# Patient Record
Sex: Male | Born: 1970 | Race: Black or African American | Hispanic: No | Marital: Married | Smoking: Never smoker
Health system: Southern US, Community
[De-identification: ages and names within clinical notes are randomized; demographics above are authoritative.]

## PROBLEM LIST (undated history)

## (undated) DIAGNOSIS — N529 Male erectile dysfunction, unspecified: Secondary | ICD-10-CM

## (undated) DIAGNOSIS — E11319 Type 2 diabetes mellitus with unspecified diabetic retinopathy without macular edema: Secondary | ICD-10-CM

## (undated) DIAGNOSIS — R9431 Abnormal electrocardiogram [ECG] [EKG]: Secondary | ICD-10-CM

## (undated) DIAGNOSIS — E785 Hyperlipidemia, unspecified: Secondary | ICD-10-CM

## (undated) DIAGNOSIS — I63521 Cerebral infarction due to unspecified occlusion or stenosis of right anterior cerebral artery: Secondary | ICD-10-CM

## (undated) DIAGNOSIS — E1169 Type 2 diabetes mellitus with other specified complication: Secondary | ICD-10-CM

## (undated) DIAGNOSIS — I2510 Atherosclerotic heart disease of native coronary artery without angina pectoris: Secondary | ICD-10-CM

## (undated) DIAGNOSIS — H6980 Other specified disorders of Eustachian tube, unspecified ear: Secondary | ICD-10-CM

## (undated) DIAGNOSIS — R339 Retention of urine, unspecified: Secondary | ICD-10-CM

## (undated) DIAGNOSIS — M545 Low back pain, unspecified: Secondary | ICD-10-CM

## (undated) DIAGNOSIS — I10 Essential (primary) hypertension: Secondary | ICD-10-CM

## (undated) DIAGNOSIS — I889 Nonspecific lymphadenitis, unspecified: Secondary | ICD-10-CM

## (undated) DIAGNOSIS — H6990 Unspecified Eustachian tube disorder, unspecified ear: Secondary | ICD-10-CM

## (undated) DIAGNOSIS — E1165 Type 2 diabetes mellitus with hyperglycemia: Secondary | ICD-10-CM

## (undated) DIAGNOSIS — Z8673 Personal history of transient ischemic attack (TIA), and cerebral infarction without residual deficits: Secondary | ICD-10-CM

## (undated) DIAGNOSIS — IMO0002 Reserved for concepts with insufficient information to code with codable children: Secondary | ICD-10-CM

## (undated) HISTORY — DX: Type 2 diabetes mellitus with unspecified diabetic retinopathy without macular edema: E11.319

## (undated) HISTORY — DX: Low back pain, unspecified: M54.50

## (undated) HISTORY — DX: Type 2 diabetes mellitus with unspecified diabetic retinopathy without macular edema: E11.65

## (undated) HISTORY — DX: Atherosclerotic heart disease of native coronary artery without angina pectoris: I25.10

## (undated) HISTORY — DX: Male erectile dysfunction, unspecified: N52.9

## (undated) HISTORY — DX: Essential (primary) hypertension: I10

## (undated) HISTORY — DX: Reserved for concepts with insufficient information to code with codable children: IMO0002

## (undated) HISTORY — DX: Abnormal electrocardiogram (ECG) (EKG): R94.31

## (undated) HISTORY — DX: Nonspecific lymphadenitis, unspecified: I88.9

## (undated) HISTORY — DX: Low back pain: M54.5

## (undated) HISTORY — DX: Hyperlipidemia, unspecified: E78.5

## (undated) HISTORY — DX: Cerebral infarction due to unspecified occlusion or stenosis of right anterior cerebral artery: I63.521

## (undated) HISTORY — DX: Other specified disorders of Eustachian tube, unspecified ear: H69.80

## (undated) HISTORY — DX: Retention of urine, unspecified: R33.9

## (undated) HISTORY — DX: Type 2 diabetes mellitus with other specified complication: E11.69

## (undated) HISTORY — DX: Personal history of transient ischemic attack (TIA), and cerebral infarction without residual deficits: Z86.73

## (undated) HISTORY — DX: Unspecified eustachian tube disorder, unspecified ear: H69.90

---

## 2007-10-09 ENCOUNTER — Ambulatory Visit: Payer: Self-pay | Admitting: Family Medicine

## 2007-10-09 DIAGNOSIS — M25519 Pain in unspecified shoulder: Secondary | ICD-10-CM

## 2007-10-09 DIAGNOSIS — Z860100 Personal history of colon polyps, unspecified: Secondary | ICD-10-CM | POA: Insufficient documentation

## 2007-10-09 DIAGNOSIS — E1151 Type 2 diabetes mellitus with diabetic peripheral angiopathy without gangrene: Secondary | ICD-10-CM

## 2007-10-09 DIAGNOSIS — F528 Other sexual dysfunction not due to a substance or known physiological condition: Secondary | ICD-10-CM

## 2007-11-16 ENCOUNTER — Ambulatory Visit: Payer: Self-pay | Admitting: Family Medicine

## 2007-11-16 LAB — CONVERTED CEMR LAB
Albumin: 3.8 g/dL (ref 3.5–5.2)
BUN: 10 mg/dL (ref 6–23)
Calcium: 9.1 mg/dL (ref 8.4–10.5)
Cholesterol: 224 mg/dL (ref 0–200)
Creatinine,U: 140.6 mg/dL
Eosinophils Absolute: 0.1 10*3/uL (ref 0.0–0.7)
Eosinophils Relative: 1.8 % (ref 0.0–5.0)
GFR calc Af Amer: 107 mL/min
Glucose, Bld: 275 mg/dL — ABNORMAL HIGH (ref 70–99)
HCT: 43.6 % (ref 39.0–52.0)
Hemoglobin: 15.5 g/dL (ref 13.0–17.0)
MCV: 82.3 fL (ref 78.0–100.0)
Microalb Creat Ratio: 167.1 mg/g — ABNORMAL HIGH (ref 0.0–30.0)
Microalb, Ur: 23.5 mg/dL — ABNORMAL HIGH (ref 0.0–1.9)
Monocytes Absolute: 0.3 10*3/uL (ref 0.1–1.0)
Monocytes Relative: 8.3 % (ref 3.0–12.0)
Neutro Abs: 1.5 10*3/uL (ref 1.4–7.7)
RDW: 14.1 % (ref 11.5–14.6)
Sodium: 132 meq/L — ABNORMAL LOW (ref 135–145)
Total CHOL/HDL Ratio: 7.6
Total Protein: 6.1 g/dL (ref 6.0–8.3)

## 2007-11-17 ENCOUNTER — Ambulatory Visit: Payer: Self-pay | Admitting: Family Medicine

## 2007-11-17 DIAGNOSIS — E782 Mixed hyperlipidemia: Secondary | ICD-10-CM | POA: Insufficient documentation

## 2007-11-17 DIAGNOSIS — I10 Essential (primary) hypertension: Secondary | ICD-10-CM

## 2007-12-25 ENCOUNTER — Encounter: Admission: RE | Admit: 2007-12-25 | Discharge: 2007-12-25 | Payer: Self-pay | Admitting: Family Medicine

## 2012-01-17 LAB — TSH: TSH: 3.96 (ref 0.41–5.90)

## 2017-01-16 DIAGNOSIS — R339 Retention of urine, unspecified: Secondary | ICD-10-CM

## 2017-01-16 HISTORY — DX: Retention of urine, unspecified: R33.9

## 2017-01-20 LAB — CBC AND DIFFERENTIAL
HCT: 37 — AB (ref 41–53)
Hemoglobin: 12.8 — AB (ref 13.5–17.5)
Platelets: 283 (ref 150–399)
WBC: 8.2

## 2017-01-21 LAB — CBC AND DIFFERENTIAL
HEMATOCRIT: 35 — AB (ref 41–53)
HEMOGLOBIN: 12.1 — AB (ref 13.5–17.5)
Platelets: 293 (ref 150–399)
WBC: 8.8

## 2017-01-21 LAB — HEMOGLOBIN A1C: Hemoglobin A1C: 11.2

## 2017-01-22 LAB — LIPID PANEL
CHOLESTEROL: 263 — AB (ref 0–200)
HDL: 38 (ref 35–70)
LDL Cholesterol: 165
TRIGLYCERIDES: 298 — AB (ref 40–160)

## 2017-01-22 LAB — POCT INR: INR: 1 (ref 0.9–1.1)

## 2017-01-22 LAB — CBC AND DIFFERENTIAL
HEMATOCRIT: 34 — AB (ref 41–53)
HEMOGLOBIN: 11.6 — AB (ref 13.5–17.5)
Platelets: 298 (ref 150–399)
WBC: 6.5

## 2017-01-22 LAB — PROTIME-INR: Protime: 9.8 — AB (ref 10.0–13.8)

## 2017-01-24 LAB — BASIC METABOLIC PANEL
BUN: 32 — AB (ref 4–21)
Creatinine: 1.8 — AB (ref 0.6–1.3)
Glucose: 254
Potassium: 4.5 (ref 3.4–5.3)
SODIUM: 136 — AB (ref 137–147)

## 2017-01-25 LAB — BASIC METABOLIC PANEL
BUN: 30 — AB (ref 4–21)
CREATININE: 1.6 — AB (ref 0.6–1.3)
GLUCOSE: 185
Potassium: 4.2 (ref 3.4–5.3)
Sodium: 138 (ref 137–147)

## 2017-01-26 LAB — BASIC METABOLIC PANEL
BUN: 35 — AB (ref 4–21)
Creatinine: 1.8 — AB (ref 0.6–1.3)
Glucose: 198
POTASSIUM: 4.1 (ref 3.4–5.3)
SODIUM: 138 (ref 137–147)

## 2017-01-28 ENCOUNTER — Encounter: Payer: Self-pay | Admitting: Adult Health

## 2017-01-28 ENCOUNTER — Encounter: Payer: Self-pay | Admitting: Internal Medicine

## 2017-01-28 ENCOUNTER — Non-Acute Institutional Stay (SKILLED_NURSING_FACILITY): Payer: 59 | Admitting: Adult Health

## 2017-01-28 DIAGNOSIS — R400 Somnolence: Secondary | ICD-10-CM

## 2017-01-28 DIAGNOSIS — E1165 Type 2 diabetes mellitus with hyperglycemia: Secondary | ICD-10-CM | POA: Diagnosis not present

## 2017-01-28 DIAGNOSIS — G8194 Hemiplegia, unspecified affecting left nondominant side: Secondary | ICD-10-CM

## 2017-01-28 DIAGNOSIS — E785 Hyperlipidemia, unspecified: Secondary | ICD-10-CM

## 2017-01-28 DIAGNOSIS — E1122 Type 2 diabetes mellitus with diabetic chronic kidney disease: Secondary | ICD-10-CM | POA: Diagnosis not present

## 2017-01-28 DIAGNOSIS — I10 Essential (primary) hypertension: Secondary | ICD-10-CM | POA: Diagnosis not present

## 2017-01-28 DIAGNOSIS — I2510 Atherosclerotic heart disease of native coronary artery without angina pectoris: Secondary | ICD-10-CM | POA: Diagnosis not present

## 2017-01-28 DIAGNOSIS — I63521 Cerebral infarction due to unspecified occlusion or stenosis of right anterior cerebral artery: Secondary | ICD-10-CM

## 2017-01-28 DIAGNOSIS — F322 Major depressive disorder, single episode, severe without psychotic features: Secondary | ICD-10-CM

## 2017-01-28 NOTE — Progress Notes (Signed)
DATE:  01/28/2017   MRN:  [id number]  BIRTHDAY: [birth year]  Facility:  Nursing Home Location:  [facility] Room Number: 309-A  LEVEL OF CARE:  SNF (31)  Contact Information    Name Relation Home Work Mobile   Zill,Frank Moss  [phone]         Code Status History    This patient does not have a recorded code status. Please follow your organizational policy for patients in this situation.       Chief Complaint  Patient presents with  . Hospitalization Follow-up    Hospital followup    HISTORY OF PRESENT ILLNESS:  This is a 49-YO male seen for hospital follow-up.  He was admitted to [facility] on 01/26/2017 following a hospitalization at [facility] 01/20/2017-01/26/2017 for S/P right ACA stroke. He was initially sent to urgent care due to dizziness observed by coworkers, then 2 ED. He was noted to have BP of 217/109, creatinine 1.5. He developed, that evening, left-sided arm and leg weakness. MRI brain showed patchy right anterior cerebral artery infarcts. He was not given IV tPA because he was outside the window for IV tPA. Neurology was consulted. MRI of the brain without contrast showed patchy right ACA infarcts. MRI angiogram of the head and neck revealed multiple areas of intracranial stenosis. EF at 45%. He was started on aspirin and Plavix. Hemoglobin A1c is 11.2. LDL was noted to be 165 and triglycerides 298. He was started on atorvastatin 80 mg at bedtime and Crestor was discontinued. He was seen in the room with daughter @ bedside and talked to wife on the phone. They were concerned about the Lexapro and Ambien which might be causing his being sleepy. He was noted to be sleepy but responds to verbal queries with eyes closed. Daughter reported that he had PT/OT earlier and was awake and participating. 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      PAST MEDICAL HISTORY:   Hypertension Chronic kidney disease Type 2 diabetes Hyperlipidemia    CURRENT MEDICATIONS: Reviewed  Patient's Medications  New Prescriptions   No medications on file  Previous Medications   ASPIRIN 325 MG TABLET    Take 325 mg by mouth daily.   ATORVASTATIN (LIPITOR) 80 MG TABLET    Take 80 mg by mouth daily.   CLONIDINE (CATAPRES) 0.2 MG TABLET    Take 0.2 mg by mouth 3 (three) times daily.   CLOPIDOGREL (PLAVIX) 75 MG TABLET    Take 75 mg by mouth daily.   DILTIAZEM (CARDIZEM) 90 MG TABLET    Take 90 mg by mouth every 6 (six) hours.   ESCITALOPRAM (LEXAPRO) 20 MG TABLET    Take 20 mg by mouth daily.   METFORMIN (GLUCOPHAGE) 1000 MG TABLET    Take 1,000 mg by mouth daily.   NITROGLYCERIN (NITROSTAT) 0.4 MG SL TABLET    Place 0.4 mg under the tongue every 5 (five) minutes as needed for chest pain.   ONDANSETRON (ZOFRAN) 4 MG TABLET    Take 4 mg by mouth every 4 (four) hours as needed for nausea or vomiting.   ZOLPIDEM (AMBIEN) 5 MG TABLET    Take 5 mg by mouth at bedtime as needed for sleep.  Modified Medications   No medications on file  Discontinued Medications   No medications on file     Allergies  Allergen  Reactions  . Beta Adrenergic Blockers Other (See Comments)    fatigue     REVIEW OF SYSTEMS:  Unable to obtain due to being sleepy    PHYSICAL EXAMINATION  GENERAL APPEARANCE: Well nourished. In no acute distress. Obese SKIN:  Skin is warm and dry.  HEAD: Normal in size and contour. No evidence of trauma EYES:Eyes closed, no drainage EARS: Pinnae are normal.  MOUTH and THROAT: Lips are without lesions.  RESPIRATORY: breathing is even & unlabored, BS CTAB CARDIAC: RRR, no murmur,no extra heart sounds, no edema GI: abdomen soft, normal BS, no masses, no tenderness, no hepatomegaly, no splenomegaly GU:  Has foley catheter draining to urine bag with clear yellowish urine EXTREMITIES:  Able to move  RUE and RLE, did not move LUE and LLE NEUROLOGICAL: There is no tremor. Sleepy PSYCHIATRIC: Alert to self, unable to answer month, year and place. Affect and behavior are appropriate   LABS/RADIOLOGY: Labs reviewed: Basic Metabolic Panel:  Recent Labs  16/04/9606/09/18 01/25/17 01/26/17  NA 136* 138 138  K 4.5 4.2 4.1  BUN 32* 30* 35*  CREATININE 1.8* 1.6* 1.8*   CBC:  Recent Labs  01/20/17 01/21/17 01/22/17  WBC 8.2 8.8 6.5  HGB 12.8* 12.1* 11.6*  HCT 37* 35* 34*  PLT 283 293 298   Lipid Panel:  Recent Labs  01/22/17  HDL 38    ASSESSMENT/PLAN:  1. Acute ischemic right ACA stroke - for rehabilitation with PT and OT, for therapeutic strengthening exercises; continue Plavix 75 mg 1 tab by mouth daily, atorvastatin 80 mg 1 tab by mouth daily at bedtime and aspirin EC 325 mg 1 tab by mouth daily ; follow-up with neurology   2. Left hemiparesis - for rehabilitation with PT and OT, for therapeutic strengthening exercises;  Fall precautions   3. Uncontrolled type 2 diabetes mellitus with chronic kidney disease, without long-term current use of insulin, unspecified CKD stage (HCC) - continue metformin 1000 mg 1 tab by mouth daily and CBG daily; check BMP Lab Results  Component Value Date   HGBA1C 11.2 (H) 11/16/2007    4. Coronary artery disease involving native coronary artery of native heart without angina pectoris - no chest pains, continue NTG when necessary   5. Essential hypertension - continue diltiazem 90 mg 1 tab by mouth every 6 hours and clonidine 0.2 mg 1 tab by mouth 3 times a day  6. Depression, major, single episode, severe (HCC) - patient was noted to be sleepy, will decrease Lexapro from 20 mg to 10 mg daily, psych consult with [facility]   7. Hyperlipidemia, unspecified hyperlipidemia type - continue atorvastatin 80 mg 1 tab by mouth daily at bedtime Lab Results  Component Value Date   CHOL 263 (A) 01/22/2017   HDL 38 01/22/2017   LDLCALC 165 01/22/2017     LDLDIRECT 85.0 11/16/2007   TRIG 298 (A) 01/22/2017   CHOLHDL 7.6 CALC 11/16/2007    8. Somnolence -  will discontinue Ambien and montor     Goals of care:  Short-term rehabilitation    Andee Chivers C. Medina-Vargas - NP    [facility] [phone]

## 2017-01-28 NOTE — Progress Notes (Signed)
    NURSING HOME LOCATION:  [facility] ROOM NUMBER:  309-A  CODE STATUS:  Full  PCP:  Roderick Peeodd, Jeffrey A, MD  [address] [place] [id number]   This is a comprehensive admission note to [facility] performed on this date less than 30 days from date of admission. Included are preadmission medical/surgical history;reconciled medication list; family history; social history and comprehensive review of systems.  Corrections and additions to the records were documented . Comprehensive physical exam was also performed. Additionally a clinical summary was entered for each active diagnosis pertinent to this admission in the Problem List to enhance continuity of care.  HPI:  Past medical and surgical history:  Social history:  Family history:  Review of systems:Could not be completed due to dementia. Date given as Constitutional: No fever,significant weight change, fatigue  Eyes: No redness, discharge, pain, vision change ENT/mouth: No nasal congestion,  purulent discharge, earache,change in hearing ,sore throat  Cardiovascular: No chest pain, palpitations,paroxysmal nocturnal dyspnea, claudication, edema  Respiratory: No cough, sputum production,hemoptysis, DOE , significant snoring,apnea  Gastrointestinal: No heartburn,dysphagia,abdominal pain, nausea / vomiting,rectal bleeding, melena,change in bowels Genitourinary: No dysuria,hematuria, pyuria,  incontinence, nocturia Musculoskeletal: No joint stiffness, joint swelling, weakness,pain Dermatologic: No rash, pruritus, change in appearance of skin Neurologic: No dizziness,headache,syncope, seizures, numbness , tingling Psychiatric: No significant anxiety , depression, insomnia, anorexia Endocrine: No change in hair/skin/ nails, excessive thirst, excessive hunger, excessive urination  Hematologic/lymphatic: No significant bruising, lymphadenopathy,abnormal bleeding Allergy/immunology: No itchy/ watery eyes,  significant sneezing, urticaria, angioedema  Physical exam:  Pertinent or positive findings: General appearance:Adequately nourished; no acute distress , increased work of breathing is present.   Lymphatic: No lymphadenopathy about the head, neck, axilla . Eyes: No conjunctival inflammation or lid edema is present. There is no scleral icterus. Ears:  External ear exam shows no significant lesions or deformities.   Nose:  External nasal examination shows no deformity or inflammation. Nasal mucosa are pink and moist without lesions ,exudates Oral exam: lips and gums are healthy appearing.There is no oropharyngeal erythema or exudate . Neck:  No thyromegaly, masses, tenderness noted.    Heart:  Normal rate and regular rhythm. S1 and S2 normal without gallop, murmur, click, rub .  Lungs:Chest clear to auscultation without wheezes, rhonchi,rales , rubs. Abdomen:Bowel sounds are normal. Abdomen is soft and nontender with no organomegaly, hernias,masses. GU: deferred  Extremities:  No cyanosis, clubbing,edema  Neurologic exam : Strength equal  in upper & lower extremities Balance,Rhomberg,finger to nose testing could not be completed due to clinical state Deep tendon reflexes are equal Skin: Warm & dry w/o tenting. No significant lesions or rash.  See clinical summary under each active problem in the Problem List with associated updated therapeutic plan   This encounter was created in error - please disregard.

## 2017-01-31 ENCOUNTER — Encounter: Payer: Self-pay | Admitting: Internal Medicine

## 2017-01-31 ENCOUNTER — Non-Acute Institutional Stay (SKILLED_NURSING_FACILITY): Payer: 59 | Admitting: Internal Medicine

## 2017-01-31 DIAGNOSIS — I10 Essential (primary) hypertension: Secondary | ICD-10-CM | POA: Diagnosis not present

## 2017-01-31 DIAGNOSIS — E1151 Type 2 diabetes mellitus with diabetic peripheral angiopathy without gangrene: Secondary | ICD-10-CM | POA: Diagnosis not present

## 2017-01-31 DIAGNOSIS — I63521 Cerebral infarction due to unspecified occlusion or stenosis of right anterior cerebral artery: Secondary | ICD-10-CM

## 2017-01-31 NOTE — Assessment & Plan Note (Addendum)
Random glucoses 273-360 Basal and pre-meal insulin ordered

## 2017-01-31 NOTE — Patient Instructions (Addendum)
See assessment and plan under each diagnosis in the problem list and acutely for this visit. Short term disability papers completed until he can establish with Dr. Everlena CooperJaffe

## 2017-01-31 NOTE — Progress Notes (Signed)
NURSING HOME LOCATION:  [facility] ROOM NUMBER:  309-A  CODE STATUS:  Full Code  PCP:  Roderick Peeodd, Jeffrey A, MD  [address] [place] [id number]  01/31/17 patient states he is no longer seeing Dr. Tawanna Coolerodd, according to his wife he has been going to the [facility] as needed. He will need to establish with a PCP.  This is a comprehensive admission note to [facility] performed on this date less than 30 days from date of admission. Included are preadmission medical/surgical history;reconciled medication list; family history; social history and comprehensive review of systems.  Corrections and additions to the records were documented . Comprehensive physical exam was also performed. Additionally a clinical summary was entered for each active diagnosis pertinent to this admission in the Problem List to enhance continuity of care.  HPI: [facility]-7/11/18 with a right ACA stroke. While at work he developed dizziness which prompted referral to urgent care by his coworkers at [place] place [place]. He was sent from there to the ED where blood pressure was 217/109. That evening he developed left-sided arm and leg weakness. MRI revealed patchy right anterior cerebral artery infarcts. He was outside the window for TPA administration. MRI angiogram of the head and neck revealed multiple areas of intracranial stenosis. Echo revealed ejection fraction 45%. Neurology initiated aspirin and Plavix. A1c was 11.2 % indicating poorly controlled diabetes. LDL was 165 and triglycerides 298. High-dose statin was initiated.  Past medical and surgical history:Includes type 2 diabetes with retinopathy; occlusive coronary disease; hypertensionH and chronic low back pain.   He had colon polyps removed in 1990s but has had no follow-up. His last Epic note was 11/17/2007 ;at that time urine microalbumin was 67 and A1c  11.2%. Triglycerides were 1367. He had been noncompliant with his cholesterol-lowering medication and his diabetic medications. He was told frankly that risk of renal disease, heart attack, or stroke were high.   Social history:Never smoker, nondrinker. He has been a Tree surgeoncomputer analyst, his wife is an Pensions consultantattorney.   Family history: Mother hypertension. He denies family history of cancer, diabetes, or stroke.  Review of systems: Was limited due to somewhat dysarthric state. Date given as July 19 or 20th, 2018. He states he saw a physician one month ago but cannot provide his name. He states that he was on metformin alone for diabetes. He stated that he would check his glucose is a "few times a week". Initially he said the highest glucose was 120 but then stated not he was not able to give me a specific number. His wife states that he has a blood pressure cuff at home but does not employ it. She states the number of pills remaining in the bottle indicate that he has been noncompliant in taking the blood pressure medications prescribed. She states he had been going to the gym and felt that this may be enough to decrease the risk. His wife and daughter requested Ambien be discontinued and his Lexapro dose be decreased  due to somnolence when he was initially seen 7/13 by the NP.  Constitutional: No fever,significant weight change, fatigue  Eyes: No redness, discharge, pain, vision change ENT/mouth: No nasal congestion,  purulent discharge, earache,change in hearing ,sore throat  Cardiovascular: No chest pain, palpitations,paroxysmal nocturnal dyspnea, claudication, edema  Respiratory: No cough, sputum production,hemoptysis, DOE , significant snoring,apnea  Gastrointestinal: No heartburn,dysphagia,abdominal pain, nausea / vomiting,rectal bleeding, melena,change in bowels Genitourinary:  No dysuria,hematuria, pyuria,  incontinence, nocturia Musculoskeletal: No joint stiffness, joint swelling,  weakness,pain Dermatologic: No rash, pruritus, change in appearance of skin Neurologic: No dizziness,headache,syncope, seizures, numbness , tingling Psychiatric: No significant anxiety , depression, insomnia, anorexia Endocrine: No change in hair/skin/ nails, excessive thirst, excessive hunger, excessive urination  Hematologic/lymphatic: No significant bruising, lymphadenopathy,abnormal bleeding Allergy/immunology: No itchy/ watery eyes, significant sneezing, urticaria, angioedema  Physical exam:  Pertinent or positive findings: Affect is flat. Answers were usually delayed and typically monosyllabic. Pattern alopecia is present. He has a beard and mustache. The smile is slightly asymmetric. There is a brief systolic murmur at the left base. Second heart sound is increased. Pedal pulses are decreased. He has no movement of the left upper or left lower extremities. Left great toe was upgoing with stimulation of the foot.  General appearance:Adequately nourished; no acute distress , increased work of breathing is present.   Lymphatic: No lymphadenopathy about the head, neck, axilla . Eyes: No conjunctival inflammation or lid edema is present. There is no scleral icterus. Ears:  External ear exam shows no significant lesions or deformities.   Nose:  External nasal examination shows no deformity or inflammation. Nasal mucosa are pink and moist without lesions ,exudates Oral exam: lips and gums are healthy appearing.There is no oropharyngeal erythema or exudate . Neck:  No thyromegaly, masses, tenderness noted.    Heart: S1 normal. No gallop, click, rub .  Lungs:Chest clear to auscultation without wheezes, rhonchi,rales , rubs. Abdomen:Bowel sounds are normal. Abdomen is soft and nontender with no organomegaly, hernias,masses. GU: Foley in place  Extremities:  No cyanosis, clubbing,edema  Neurologic exam : Balance,Rhomberg,finger to nose testing could not be completed due to clinical  state Skin: Warm & dry w/o tenting. No significant lesions or rash.  See clinical summary under each active problem in the Problem List with associated updated therapeutic plan

## 2017-01-31 NOTE — Assessment & Plan Note (Addendum)
Permissive hypertension Full dose aspirin and Plavix High-dose statin Family requests referral to [facility], Dr Everlena CooperJaffe, as patient lives in [place]

## 2017-01-31 NOTE — Assessment & Plan Note (Signed)
At SNF blood pressures varied from 122/62-174/100 Avoid excessively low blood pressures due to MRI angiogram findings

## 2017-02-07 ENCOUNTER — Encounter: Payer: Self-pay | Admitting: Adult Health

## 2017-02-07 ENCOUNTER — Non-Acute Institutional Stay (SKILLED_NURSING_FACILITY): Payer: 59 | Admitting: Adult Health

## 2017-02-07 DIAGNOSIS — R339 Retention of urine, unspecified: Secondary | ICD-10-CM | POA: Diagnosis not present

## 2017-02-07 DIAGNOSIS — N182 Chronic kidney disease, stage 2 (mild): Secondary | ICD-10-CM | POA: Diagnosis not present

## 2017-02-07 DIAGNOSIS — I63521 Cerebral infarction due to unspecified occlusion or stenosis of right anterior cerebral artery: Secondary | ICD-10-CM

## 2017-02-07 DIAGNOSIS — E1165 Type 2 diabetes mellitus with hyperglycemia: Secondary | ICD-10-CM | POA: Diagnosis not present

## 2017-02-07 DIAGNOSIS — E1122 Type 2 diabetes mellitus with diabetic chronic kidney disease: Secondary | ICD-10-CM | POA: Diagnosis not present

## 2017-02-07 NOTE — Progress Notes (Signed)
DATE:  02/07/2017   MRN:  [id number]  BIRTHDAY: 1970/12/03  Facility:  Nursing Home Location:  [facility] Room Number: Mar 11, 2069  LEVEL OF CARE:  SNF (31)  Contact Information    Name Relation Home Work Mobile   Rasor,[name]  [phone]         Code Status History    This patient does not have a recorded code status. Please follow your organizational policy for patients in this situation.       Chief Complaint  Patient presents with  . Acute Visit    Urinary retention    HISTORY OF PRESENT ILLNESS:  This is a 46-YO male seen for an acute visit secondary to urinary retention. He was seen in the room today and was seen fidgeting with the foley catheter. Trial discontinuation of foley catheter was done previously but he was not able to void and foley catheter was re-inserted. [facility]2017 [facility] 01/20/2017-01/26/2017 for S/P right ACA stroke. He has left-sided hemiparesis. 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    PAST MEDICAL HISTORY:  Past Medical History:  Diagnosis Date  . Abnormal ECG   . Acute ischemic right ACA stroke (HCC)   . Coronary artery disease, occlusive   . Dysfunction of eustachian tube   . History of right ACA stroke   . Hyperlipidemia associated with type 2 diabetes mellitus (HCC)   . Hypertension   . Impotence of organic origin   . Lumbago   . Lymphadenitis    Unspecified, except mesenteric  . Type 2 diabetes, uncontrolled, with retinopathy (HCC)   . Urinary retention 01/2017     CURRENT MEDICATIONS: Reviewed  Patient's Medications  New Prescriptions   No medications on file  Previous Medications   ASPIRIN 325 MG TABLET    Take 325 mg by mouth daily.   ATORVASTATIN (LIPITOR) 80 MG TABLET    Take 80 mg by mouth  daily.   CLONIDINE (CATAPRES) 0.2 MG TABLET    Take 0.2 mg by mouth 3 (three) times daily.   CLOPIDOGREL (PLAVIX) 75 MG TABLET    Take 75 mg by mouth daily.   DILTIAZEM (CARDIZEM) 90 MG TABLET    Take 90 mg by mouth every 6 (six) hours.   ESCITALOPRAM (LEXAPRO) 10 MG TABLET    Take 10 mg by mouth daily.   INSULIN GLARGINE (LANTUS) 100 UNIT/ML INJECTION    Inject 15 Units into the skin at bedtime.   INSULIN LISPRO (HUMALOG) 100 UNIT/ML INJECTION    Inject 5 Units into the skin. Give 5 units regular if BS >180 before meals TID   METFORMIN (GLUCOPHAGE) 1000 MG TABLET    Take 1,000 mg by mouth daily.   NITROGLYCERIN (NITROSTAT) 0.4 MG SL TABLET    Place 0.4 mg under the tongue every 5 (five) minutes as needed for chest pain.   ONDANSETRON (ZOFRAN) 4 MG TABLET    Take 4 mg by mouth every 4 (four) hours as needed for nausea or vomiting.  Modified Medications   No medications on file  Discontinued Medications   No medications on file     Allergies  Allergen Reactions  . Beta Adrenergic Blockers Other (See Comments)    fatigue     REVIEW OF SYSTEMS:  GENERAL: no change in appetite, no fatigue, no weight  changes, no fever or chills  EYES: Denies change in vision, dry eyes, eye pain, itching or discharge EARS: Denies change in hearing, ringing in ears, or earache NOSE: Denies nasal congestion or epistaxis MOUTH and THROAT: Denies oral discomfort, gingival pain or bleeding, pain from teeth or hoarseness   RESPIRATORY: no cough, SOB, DOE, wheezing, hemoptysis CARDIAC: no chest pain, edema or palpitations GI: no abdominal pain, diarrhea, constipation, heart burn, nausea or vomiting GU: +urinary retention PSYCHIATRIC: Denies feeling of depression or anxiety. No report of hallucinations, insomnia, paranoia, or agitation    PHYSICAL EXAMINATION  GENERAL APPEARANCE: Well nourished. In no acute distress. Obese SKIN:  Skin is warm and dry.  HEAD: Normal in size and contour. No evidence of  trauma EYES: Lids open and close normally. No blepharitis, entropion or ectropion. EARS: Pinnae are normal. Patient hears normal voice tunes of the examiner MOUTH and THROAT: Lips are without lesions. Oral mucosa is moist and without lesions. Tongue is normal in shape, size, and color and without lesions NECK: supple, trachea midline, no neck masses, no thyroid tenderness, no thyromegaly LYMPHATICS: no LAN in the neck, no supraclavicular LAN RESPIRATORY: breathing is even & unlabored, BS CTAB CARDIAC: RRR, no murmur,no extra heart sounds, no edema GI: abdomen soft, normal BS, no masses, no tenderness, no hepatomegaly, no splenomegaly GU:  Has foley catheter draining to urine bag with clear yellowish urine EXTREMITIES:  Able to move BUE and RLE, was not able to move LLE PSYCHIATRIC: Alert to self, disoriented to time and place. Affect and behavior are appropriate   LABS/RADIOLOGY: Labs reviewed: 02/04/17  Glucose 167, calcium 9.3 creatinine 1.41  BUN 26.7  Na 136  K 4.8  GFR 67.65 Basic Metabolic Panel:  Recent Labs  16/10/96 01/25/17 01/26/17  NA 136* 138 138  K 4.5 4.2 4.1  BUN 32* 30* 35*  CREATININE 1.8* 1.6* 1.8*   CBC:  Recent Labs  01/20/17 01/21/17 01/22/17  WBC 8.2 8.8 6.5  HGB 12.8* 12.1* 11.6*  HCT 37* 35* 34*  PLT 283 293 298   Lipid Panel:  Recent Labs  01/22/17  HDL 38    ASSESSMENT/PLAN:  1. Urinary retention - failed trial voiding, will continue foley catheter, will need referral to urology  2. Acute ischemic right ACA stroke (HCC) - will continue rehabilitation with PT and OT for therapeutic strengthening exercises, continue Plavix 75 mg 1 tab by mouth daily, atorvastatin 80 mg 1 tab by mouth daily at bedtime, aspirin EC 325 mg 1 tab by mouth daily, Atorvastatin 80 mg 1 tab PO Q , Diltiazem 90 mg 1 tab PO Q 6 hours and Clonidine 0.2 mg 1 tab PO TID  3. Uncontrolled type 2 diabetes mellitus with chronic kidney disease, without long-term current use of  insulin, unspecified CKD stage (HCC) - latest CBGs are well-controlled - 230, 202,  188, 208, 139, 200, 164, continue Lantus 100 units/ml inject 15 units SQ Q HS, Humalog 100 units/ml inject 5 units SQ TIDac if CBG >180,  Metformin 1,000 mg 1 tab PO Q D and CBG ACHS Lab Results  Component Value Date   HGBA1C 11.2 01/21/2017   4. Chronic Kidney disease, stage 2 - GFR 67.65 and creatinine 1.41, will monitor     Lyah Millirons C. Medina-Vargas - NP    [facility] [phone]

## 2017-02-08 ENCOUNTER — Encounter (HOSPITAL_COMMUNITY): Payer: Self-pay

## 2017-02-08 ENCOUNTER — Inpatient Hospital Stay (HOSPITAL_COMMUNITY)
Admission: EM | Admit: 2017-02-08 | Discharge: 2017-02-11 | DRG: 065 | Disposition: A | Discharge status: Rehabilitation Facility | Payer: 59 | Attending: Internal Medicine | Admitting: Internal Medicine

## 2017-02-08 ENCOUNTER — Emergency Department (HOSPITAL_COMMUNITY): Payer: 59

## 2017-02-08 ENCOUNTER — Non-Acute Institutional Stay (SKILLED_NURSING_FACILITY): Payer: 59 | Admitting: Adult Health

## 2017-02-08 ENCOUNTER — Observation Stay (HOSPITAL_COMMUNITY): Payer: 59

## 2017-02-08 ENCOUNTER — Other Ambulatory Visit: Payer: Self-pay | Admitting: Internal Medicine

## 2017-02-08 ENCOUNTER — Encounter: Payer: Self-pay | Admitting: Adult Health

## 2017-02-08 DIAGNOSIS — R4189 Other symptoms and signs involving cognitive functions and awareness: Secondary | ICD-10-CM | POA: Diagnosis present

## 2017-02-08 DIAGNOSIS — Z7984 Long term (current) use of oral hypoglycemic drugs: Secondary | ICD-10-CM

## 2017-02-08 DIAGNOSIS — G8114 Spastic hemiplegia affecting left nondominant side: Secondary | ICD-10-CM

## 2017-02-08 DIAGNOSIS — I69319 Unspecified symptoms and signs involving cognitive functions following cerebral infarction: Secondary | ICD-10-CM

## 2017-02-08 DIAGNOSIS — Z7902 Long term (current) use of antithrombotics/antiplatelets: Secondary | ICD-10-CM

## 2017-02-08 DIAGNOSIS — Z6829 Body mass index (BMI) 29.0-29.9, adult: Secondary | ICD-10-CM

## 2017-02-08 DIAGNOSIS — E669 Obesity, unspecified: Secondary | ICD-10-CM | POA: Diagnosis present

## 2017-02-08 DIAGNOSIS — R339 Retention of urine, unspecified: Secondary | ICD-10-CM | POA: Diagnosis present

## 2017-02-08 DIAGNOSIS — N183 Chronic kidney disease, stage 3 unspecified: Secondary | ICD-10-CM | POA: Diagnosis present

## 2017-02-08 DIAGNOSIS — I2510 Atherosclerotic heart disease of native coronary artery without angina pectoris: Secondary | ICD-10-CM | POA: Diagnosis present

## 2017-02-08 DIAGNOSIS — G459 Transient cerebral ischemic attack, unspecified: Secondary | ICD-10-CM

## 2017-02-08 DIAGNOSIS — I10 Essential (primary) hypertension: Secondary | ICD-10-CM | POA: Diagnosis present

## 2017-02-08 DIAGNOSIS — I679 Cerebrovascular disease, unspecified: Secondary | ICD-10-CM

## 2017-02-08 DIAGNOSIS — R29712 NIHSS score 12: Secondary | ICD-10-CM | POA: Diagnosis present

## 2017-02-08 DIAGNOSIS — I69391 Dysphagia following cerebral infarction: Secondary | ICD-10-CM

## 2017-02-08 DIAGNOSIS — I63521 Cerebral infarction due to unspecified occlusion or stenosis of right anterior cerebral artery: Secondary | ICD-10-CM | POA: Diagnosis present

## 2017-02-08 DIAGNOSIS — E1122 Type 2 diabetes mellitus with diabetic chronic kidney disease: Secondary | ICD-10-CM | POA: Diagnosis present

## 2017-02-08 DIAGNOSIS — G8194 Hemiplegia, unspecified affecting left nondominant side: Secondary | ICD-10-CM | POA: Diagnosis present

## 2017-02-08 DIAGNOSIS — R2990 Unspecified symptoms and signs involving the nervous system: Secondary | ICD-10-CM

## 2017-02-08 DIAGNOSIS — E782 Mixed hyperlipidemia: Secondary | ICD-10-CM | POA: Diagnosis not present

## 2017-02-08 DIAGNOSIS — I672 Cerebral atherosclerosis: Secondary | ICD-10-CM | POA: Diagnosis present

## 2017-02-08 DIAGNOSIS — I639 Cerebral infarction, unspecified: Principal | ICD-10-CM | POA: Diagnosis present

## 2017-02-08 DIAGNOSIS — R471 Dysarthria and anarthria: Secondary | ICD-10-CM | POA: Diagnosis present

## 2017-02-08 DIAGNOSIS — I129 Hypertensive chronic kidney disease with stage 1 through stage 4 chronic kidney disease, or unspecified chronic kidney disease: Secondary | ICD-10-CM | POA: Diagnosis present

## 2017-02-08 DIAGNOSIS — Z79899 Other long term (current) drug therapy: Secondary | ICD-10-CM

## 2017-02-08 DIAGNOSIS — R1311 Dysphagia, oral phase: Secondary | ICD-10-CM

## 2017-02-08 DIAGNOSIS — Z7982 Long term (current) use of aspirin: Secondary | ICD-10-CM

## 2017-02-08 DIAGNOSIS — F329 Major depressive disorder, single episode, unspecified: Secondary | ICD-10-CM | POA: Diagnosis present

## 2017-02-08 DIAGNOSIS — R1310 Dysphagia, unspecified: Secondary | ICD-10-CM

## 2017-02-08 DIAGNOSIS — E1165 Type 2 diabetes mellitus with hyperglycemia: Secondary | ICD-10-CM | POA: Diagnosis present

## 2017-02-08 DIAGNOSIS — E1151 Type 2 diabetes mellitus with diabetic peripheral angiopathy without gangrene: Secondary | ICD-10-CM | POA: Diagnosis not present

## 2017-02-08 DIAGNOSIS — R414 Neurologic neglect syndrome: Secondary | ICD-10-CM

## 2017-02-08 DIAGNOSIS — Z888 Allergy status to other drugs, medicaments and biological substances status: Secondary | ICD-10-CM

## 2017-02-08 DIAGNOSIS — R4702 Dysphasia: Secondary | ICD-10-CM | POA: Diagnosis present

## 2017-02-08 DIAGNOSIS — Z9114 Patient's other noncompliance with medication regimen: Secondary | ICD-10-CM

## 2017-02-08 DIAGNOSIS — Z8249 Family history of ischemic heart disease and other diseases of the circulatory system: Secondary | ICD-10-CM

## 2017-02-08 DIAGNOSIS — E11319 Type 2 diabetes mellitus with unspecified diabetic retinopathy without macular edema: Secondary | ICD-10-CM | POA: Diagnosis present

## 2017-02-08 LAB — COMPREHENSIVE METABOLIC PANEL
ALBUMIN: 3.1 g/dL — AB (ref 3.5–5.0)
ALT: 25 U/L (ref 17–63)
ANION GAP: 7 (ref 5–15)
AST: 19 U/L (ref 15–41)
Alkaline Phosphatase: 118 U/L (ref 38–126)
BILIRUBIN TOTAL: 0.5 mg/dL (ref 0.3–1.2)
BUN: 20 mg/dL (ref 6–20)
CHLORIDE: 100 mmol/L — AB (ref 101–111)
CO2: 29 mmol/L (ref 22–32)
Calcium: 8.9 mg/dL (ref 8.9–10.3)
Creatinine, Ser: 1.59 mg/dL — ABNORMAL HIGH (ref 0.61–1.24)
GFR calc Af Amer: 58 mL/min — ABNORMAL LOW (ref >60)
GFR calc non Af Amer: 50 mL/min — ABNORMAL LOW (ref >60)
GLUCOSE: 176 mg/dL — AB (ref 65–99)
POTASSIUM: 4.5 mmol/L (ref 3.5–5.1)
SODIUM: 136 mmol/L (ref 135–145)
Total Protein: 6.3 g/dL — ABNORMAL LOW (ref 6.5–8.1)

## 2017-02-08 LAB — GLUCOSE, CAPILLARY
GLUCOSE-CAPILLARY: 146 mg/dL — AB (ref 65–99)
GLUCOSE-CAPILLARY: 148 mg/dL — AB (ref 65–99)

## 2017-02-08 LAB — I-STAT CHEM 8, ED
BUN: 23 mg/dL — AB (ref 6–20)
CHLORIDE: 97 mmol/L — AB (ref 101–111)
Calcium, Ion: 1.12 mmol/L — ABNORMAL LOW (ref 1.15–1.40)
Creatinine, Ser: 1.5 mg/dL — ABNORMAL HIGH (ref 0.61–1.24)
Glucose, Bld: 174 mg/dL — ABNORMAL HIGH (ref 65–99)
HEMATOCRIT: 39 % (ref 39.0–52.0)
Hemoglobin: 13.3 g/dL (ref 13.0–17.0)
Potassium: 4.5 mmol/L (ref 3.5–5.1)
SODIUM: 136 mmol/L (ref 135–145)
TCO2: 27 mmol/L (ref 0–100)

## 2017-02-08 LAB — CBC
HCT: 36.5 % — ABNORMAL LOW (ref 39.0–52.0)
HEMOGLOBIN: 12.9 g/dL — AB (ref 13.0–17.0)
MCH: 28 pg (ref 26.0–34.0)
MCHC: 35.3 g/dL (ref 30.0–36.0)
MCV: 79.2 fL (ref 78.0–100.0)
PLATELETS: 320 10*3/uL (ref 150–400)
RBC: 4.61 MIL/uL (ref 4.22–5.81)
RDW: 13.5 % (ref 11.5–15.5)
WBC: 7.1 10*3/uL (ref 4.0–10.5)

## 2017-02-08 LAB — DIFFERENTIAL
BASOS ABS: 0.1 10*3/uL (ref 0.0–0.1)
BASOS PCT: 1 %
EOS ABS: 0.1 10*3/uL (ref 0.0–0.7)
Eosinophils Relative: 2 %
LYMPHS ABS: 1.7 10*3/uL (ref 0.7–4.0)
Lymphocytes Relative: 24 %
Monocytes Absolute: 0.5 10*3/uL (ref 0.1–1.0)
Monocytes Relative: 7 %
NEUTROS PCT: 66 %
Neutro Abs: 4.7 10*3/uL (ref 1.7–7.7)

## 2017-02-08 LAB — TROPONIN I: Troponin I: <0.03 ng/mL (ref <0.03)

## 2017-02-08 LAB — APTT: APTT: 22 s — AB (ref 24–36)

## 2017-02-08 LAB — PROTIME-INR
INR: 1.01
PROTHROMBIN TIME: 13.3 s (ref 11.4–15.2)

## 2017-02-08 LAB — I-STAT TROPONIN, ED: Troponin i, poc: 0.01 ng/mL (ref 0.00–0.08)

## 2017-02-08 MED ORDER — SENNOSIDES-DOCUSATE SODIUM 8.6-50 MG PO TABS: 1.0000 | ORAL_TABLET | Freq: Every evening | ORAL | Status: DC | PRN

## 2017-02-08 MED ORDER — ASPIRIN EC 325 MG PO TBEC
325.0000 mg | DELAYED_RELEASE_TABLET | Freq: Every day | ORAL | Status: DC
  Administered 2017-02-09 – 2017-02-10 (×2): 325 mg via ORAL
  Filled 2017-02-08 (×2): qty 1

## 2017-02-08 MED ORDER — SODIUM CHLORIDE 0.9 % IV SOLN
INTRAVENOUS | Status: DC
  Administered 2017-02-08: 23:00:00 via INTRAVENOUS

## 2017-02-08 MED ORDER — STROKE: EARLY STAGES OF RECOVERY BOOK
Freq: Once | Status: AC
  Administered 2017-02-08: 22:00:00

## 2017-02-08 MED ORDER — BISACODYL 10 MG RE SUPP: 10.0000 mg | Freq: Once | RECTAL | Status: DC | PRN

## 2017-02-08 MED ORDER — ACETAMINOPHEN 650 MG RE SUPP: 650.0000 mg | RECTAL | Status: DC | PRN

## 2017-02-08 MED ORDER — ATORVASTATIN CALCIUM 80 MG PO TABS
80.0000 mg | ORAL_TABLET | Freq: Every day | ORAL | Status: DC
  Administered 2017-02-09 – 2017-02-10 (×2): 80 mg via ORAL
  Filled 2017-02-08 (×2): qty 1

## 2017-02-08 MED ORDER — ACETAMINOPHEN 325 MG PO TABS
650.0000 mg | ORAL_TABLET | ORAL | Status: DC | PRN
  Administered 2017-02-09: 650 mg via ORAL
  Filled 2017-02-08: qty 2

## 2017-02-08 MED ORDER — ESCITALOPRAM OXALATE 10 MG PO TABS
10.0000 mg | ORAL_TABLET | Freq: Every day | ORAL | Status: DC
  Administered 2017-02-09 – 2017-02-10 (×2): 10 mg via ORAL
  Filled 2017-02-08 (×2): qty 1

## 2017-02-08 MED ORDER — FLEET ENEMA 7-19 GM/118ML RE ENEM: 1.0000 | ENEMA | Freq: Once | RECTAL | Status: DC | PRN

## 2017-02-08 MED ORDER — INSULIN ASPART 100 UNIT/ML ~~LOC~~ SOLN: 0.0000 [IU] | Freq: Three times a day (TID) | SUBCUTANEOUS | Status: DC

## 2017-02-08 MED ORDER — IOPAMIDOL (ISOVUE-370) INJECTION 76%
INTRAVENOUS | Status: AC
  Administered 2017-02-08: 50 mL
  Filled 2017-02-08: qty 50

## 2017-02-08 MED ORDER — CLOPIDOGREL BISULFATE 75 MG PO TABS
75.0000 mg | ORAL_TABLET | Freq: Every day | ORAL | Status: DC
  Administered 2017-02-09 – 2017-02-10 (×2): 75 mg via ORAL
  Filled 2017-02-08 (×2): qty 1

## 2017-02-08 MED ORDER — INSULIN GLARGINE 100 UNIT/ML ~~LOC~~ SOLN
15.0000 [IU] | Freq: Every day | SUBCUTANEOUS | Status: DC
  Administered 2017-02-08 – 2017-02-10 (×3): 15 [IU] via SUBCUTANEOUS
  Filled 2017-02-08 (×4): qty 0.15

## 2017-02-08 MED ORDER — ENOXAPARIN SODIUM 40 MG/0.4ML ~~LOC~~ SOLN
40.0000 mg | SUBCUTANEOUS | Status: DC
  Administered 2017-02-08 – 2017-02-10 (×3): 40 mg via SUBCUTANEOUS
  Filled 2017-02-08 (×3): qty 0.4

## 2017-02-08 MED ORDER — ACETAMINOPHEN 160 MG/5ML PO SOLN: 650.0000 mg | ORAL | Status: DC | PRN

## 2017-02-08 NOTE — Progress Notes (Signed)
DATE:  02/08/2017   MRN:  [id number]  BIRTHDAY: 03-13-1971  Facility:  Nursing Home Location:  [facility] Room Number: 306  LEVEL OF CARE:  SNF (31)  Contact Information    Name Relation Home Work Mobile   [place]  [phone]         Code Status History    This patient does not have a recorded code status. Please follow your organizational policy for patients in this situation.       Chief Complaint  Patient presents with  . Acute Visit    Unable to swallow    HISTORY OF PRESENT ILLNESS:  This is a 20-YO male seen for an acute visit. He was reported to not able to swallow. He was able to swallow his medications this morning. He was seen in the room today with wife and speech therapist @ bedside. Speech Therapist said that his tongue is now unable to push the food and is not able to swallow. He was given the medications at lunch time and he kept it in his mouth for a long time and then eventually spat them out.   He was admitted to [facility] on 01/26/2017 following a hospitalization at [facility] 01/20/2017-01/26/2017 for S/P right ACA stroke. He has left-sided hemiparesis. 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    PAST MEDICAL HISTORY:  Past Medical History:  Diagnosis Date  . Abnormal ECG   . Acute ischemic right ACA stroke (HCC)   . Coronary artery disease, occlusive   . Dysfunction of eustachian tube   . History of right ACA stroke   . Hyperlipidemia associated with type 2 diabetes mellitus (HCC)   . Hypertension   . Impotence of organic origin   . Lumbago   . Lymphadenitis    Unspecified, except mesenteric  . Type 2 diabetes, uncontrolled, with retinopathy (HCC)   . Urinary retention 01/2017     CURRENT MEDICATIONS: Reviewed  Patient's Medications  New Prescriptions   No medications  on file  Previous Medications   ASPIRIN 325 MG TABLET    Take 325 mg by mouth daily.   ATORVASTATIN (LIPITOR) 80 MG TABLET    Take 80 mg by mouth daily.   CLONIDINE (CATAPRES) 0.2 MG TABLET    Take 0.2 mg by mouth 3 (three) times daily.   CLOPIDOGREL (PLAVIX) 75 MG TABLET    Take 75 mg by mouth daily.   DILTIAZEM (CARDIZEM) 90 MG TABLET    Take 90 mg by mouth every 6 (six) hours.   ESCITALOPRAM (LEXAPRO) 10 MG TABLET    Take 10 mg by mouth daily.   INSULIN GLARGINE (LANTUS) 100 UNIT/ML INJECTION    Inject 15 Units into the skin at bedtime.   INSULIN LISPRO (HUMALOG) 100 UNIT/ML INJECTION    Inject 5 Units into the skin. Give 5 units regular if BS >180 before meals TID   METFORMIN (GLUCOPHAGE) 1000 MG TABLET    Take 1,000 mg by mouth daily.   NITROGLYCERIN (NITROSTAT) 0.4 MG SL TABLET    Place 0.4 mg under the tongue every 5 (five) minutes as needed for chest pain.   ONDANSETRON (ZOFRAN) 4 MG TABLET    Take 4 mg by mouth every 4 (four) hours as needed for nausea or vomiting.  Modified Medications   No medications on file  Discontinued Medications   No medications  on file     Allergies  Allergen Reactions  . Beta Adrenergic Blockers Other (See Comments)    fatigue     REVIEW OF SYSTEMS:  Obtained from staff  GENERAL: no weight changes, no fever or chills  MOUTH and THROAT: Unable to swallow   RESPIRATORY: no cough, SOB, DOE, wheezing, hemoptysis CARDIAC: no chest pain, edema or palpitations PSYCHIATRIC: No report of hallucinations, insomnia, paranoia, or agitation    PHYSICAL EXAMINATION  GENERAL APPEARANCE: Well nourished. In no acute distress. Obese SKIN:  Skin is warm and dry.  HEAD: Normal in size and contour. No evidence of trauma EYES: Lids open and close normally. No blepharitis, entropion or ectropion. EARS: Pinnae are normal. Patient hears normal voice tunes of the examiner MOUTH and THROAT: Lips are without lesions. Oral mucosa is moist and without lesions.    LYMPHATICS: no LAN in the neck, no supraclavicular LAN RESPIRATORY: breathing is even & unlabored, BS CTAB CARDIAC: RRR, no murmur,no extra heart sounds, no edema GI: abdomen soft, normal BS, no masses, no tenderness, no hepatomegaly, no splenomegaly GU:  Has foley catheter draining to urine bag with clear yellowish urine EXTREMITIES:  Able to move BUE and RLE, was not able to move LLE PSYCHIATRIC: Alert to self and time, disoriented to place. Affect and behavior are appropriate   LABS/RADIOLOGY: Labs reviewed: 02/04/17  Glucose 167, calcium 9.3 creatinine 1.41  BUN 26.7  Na 136  K 4.8  GFR 67.65 Basic Metabolic Panel:  Recent Labs  16/04/9606/09/18 01/25/17 01/26/17  NA 136* 138 138  K 4.5 4.2 4.1  BUN 32* 30* 35*  CREATININE 1.8* 1.6* 1.8*   CBC:  Recent Labs  01/20/17 01/21/17 01/22/17  WBC 8.2 8.8 6.5  HGB 12.8* 12.1* 11.6*  HCT 37* 35* 34*  PLT 283 293 298   Lipid Panel:  Recent Labs  01/22/17  HDL 38    ASSESSMENT/PLAN:  1.  Oral dysphagia -  This is a new symptom and with patient having had a recent Right ACA stroke, patient will need to be transferred to the hospital for further evaluation, [facility]    Monina C. Medina-Vargas - NP    [facility] [phone]
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
## 2017-02-08 NOTE — ED Provider Notes (Signed)
[facility]EMER[facility]ENCY DEPT Provider Note   CSN: [id number] Arrival date & time: 02/08/17  1516     History   Chief Complaint Chief Complaint  Patient presents with  . Dysphagia    HPI Frank Moss is a 46 y.o. male.  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 Symptoms were noted at approximately 1300 today. Last known normal was approximately 9 to 10:00 this morning. Patient is currently living in a rehabilitation facility following his right ACA stroke. Wife noted that the patient had been improving would rehabilitation recently started declining over the past several days. Reports that he has never had any issues with swallowing. Denied any recent fevers or known infections. Patient has no acute complaints at this time. No alleviating or aggravating factors.  Past Medical History:  Diagnosis Date  . Abnormal ECG   . Acute ischemic right ACA stroke (HCC)   . Coronary artery disease, occlusive   . Dysfunction of eustachian tube   . History of right ACA stroke   . Hyperlipidemia associated with type 2 diabetes mellitus (HCC)   . Hypertension   . Impotence of organic origin   . Lumbago   . Lymphadenitis    Unspecified, except mesenteric  . Type 2 diabetes, uncontrolled, with retinopathy (HCC)   . Urinary retention 01/2017    Patient Active Problem List   Diagnosis Date Noted  . Acute ischemic right ACA stroke (HCC) 01/31/2017  . HYPERLIPIDEMIA 11/17/2007  . Accelerated hypertension 11/17/2007  . DM (diabetes mellitus), type 2 with peripheral vascular complications (HCC) 10/09/2007  . ERECTILE DYSFUNCTION, MILD 10/09/2007  . SHOULDER PAIN, LEFT 10/09/2007  . COLONIC POLYPS, HX OF 10/09/2007    History reviewed. No pertinent surgical  history.     Home Medications    Prior to Admission medications   Medication Sig Start Date End Date Taking? Authorizing Provider  aspirin 325 MG tablet Take 325 mg by mouth daily.   Yes [provider]  atorvastatin (LIPITOR) 80 MG tablet Take 80 mg by mouth at bedtime.    Yes [provider]  bisacodyl (DULCOLAX) 10 MG suppository Place 10 mg rectally once as needed (FOR CONSTIPATION IF NO RELIEF FROM MILK OF MAGNESIA).   Yes [provider]  cloNIDine (CATAPRES) 0.2 MG tablet Take 0.2 mg by mouth 3 (three) times daily.   Yes [provider]  clopidogrel (PLAVIX) 75 MG tablet Take 75 mg by mouth daily.   Yes [provider]  diltiazem (CARDIZEM) 90 MG tablet Take 90 mg by mouth every 6 (six) hours.   Yes [provider]  escitalopram (LEXAPRO) 10 MG tablet Take 10 mg by mouth daily.   Yes [provider]  insulin glargine (LANTUS) 100 UNIT/ML injection Inject 15 Units into the skin at bedtime.   Yes [provider]  insulin lispro (HUMALOG) 100 UNIT/ML injection Inject 5 Units into the skin See admin instructions. THREE TIMES A DAY BEFORE EACH MEAL IF BGL >180 MG/DL   Yes [provider]  magnesium hydroxide (MILK OF MAGNESIA) 400 MG/5ML suspension Take 30 mLs by mouth once as needed (FOR CONSTIPATION).   Yes [provider]  metFORMIN (GLUCOPHAGE) 1000 MG tablet Take 1,000 mg by mouth daily.   Yes [provider]  nitroGLYCERIN (NITROSTAT) 0.4 MG SL tablet  Place 0.4 mg under the tongue every 5 (five) minutes x 3 doses as needed for chest pain.    Yes [provider]  ondansetron (ZOFRAN) 4 MG tablet Take 4 mg by mouth every 4 (four) hours as needed for nausea or vomiting.   Yes [provider]  Sodium Phosphates (RA SALINE ENEMA) 19-7 GM/118ML ENEM Place 1 enema rectally once as needed (FOR CONSTIPATION NOT RELIEVED BY DULCOLAX SUPPOSITORY AND CALL MD IF NO RELIEF FROM ENEMA).    Yes [provider]    Family History Family History  Problem Relation Age of Onset  . Hypertension Mother   . Cancer Neg Hx   . Diabetes Neg Hx   . Stroke Neg Hx     Social History Social History  Substance Use Topics  . Smoking status: Never Smoker  . Smokeless tobacco: Never Used  . Alcohol use Not on file     Allergies   Beta adrenergic blockers   Review of Systems Review of Systems All other systems are reviewed and are negative for acute change except as noted in the HPI   Physical Exam Updated Vital Signs BP (!) 143/80 (BP Location: Left Arm)   Pulse (!) 58   Temp 98.9 F (37.2 C) (Oral)   Resp 18   Ht 6\' 1"  (1.854 m) Comment: Simultaneous filing. User may not have seen previous data.  Wt 124.3 kg (274 lb)   SpO2 100%   BMI 36.15 kg/m   Physical Exam  Constitutional: He is oriented to person, place, and time. He appears well-developed and well-nourished. No distress.  HENT:  Head: Normocephalic and atraumatic.  Nose: Nose normal.  Eyes: Pupils are equal, round, and reactive to light. Conjunctivae and EOM are normal. Right eye exhibits no discharge. Left eye exhibits no discharge. No scleral icterus.  Neck: Normal range of motion. Neck supple.  Cardiovascular: Normal rate and regular rhythm.  Exam reveals no gallop and no friction rub.   No murmur Koegel. Pulmonary/Chest: Effort normal and breath sounds normal. No stridor. No respiratory distress. He has no rales.  Abdominal: Soft. He exhibits no distension. There is no tenderness.  Musculoskeletal: He exhibits no edema or tenderness.  Neurological: He is alert and oriented to person, place, and time.  Patient is alert and oriented 3. Notable dysarthria and delayed speech. Left facial droop. Extraocular movements intact. 3/5 strength in the left upper extremity. 0/5 strength in the left lower extremity. 5/5 strength in the left upper and lower extremity. Decreased sensation in the left upper  and lower extremity. Sensation intact in the right hemibody.  Skin: Skin is warm and dry. No rash noted. He is not diaphoretic. No erythema.  Psychiatric: He has a normal mood and affect.  Vitals reviewed.    ED Treatments / Results  Labs (all labs ordered are listed, but only abnormal results are displayed) Labs Reviewed  APTT - Abnormal; Notable for the following:       Result Value   aPTT 22 (*)    All other components within normal limits  CBC - Abnormal; Notable for the following:    Hemoglobin 12.9 (*)    HCT 36.5 (*)    All other components within normal limits  COMPREHENSIVE METABOLIC PANEL - Abnormal; Notable for the following:    Chloride 100 (*)    Glucose, Bld 176 (*)    Creatinine, Ser 1.59 (*)    Total Protein 6.3 (*)    Albumin 3.1 (*)  GFR calc non Af Amer 50 (*)    GFR calc Af Amer 58 (*)    All other components within normal limits  I-STAT CHEM 8, ED - Abnormal; Notable for the following:    Chloride 97 (*)    BUN 23 (*)    Creatinine, Ser 1.50 (*)    Glucose, Bld 174 (*)    Calcium, Ion 1.12 (*)    All other components within normal limits  PROTIME-INR  DIFFERENTIAL  RAPID URINE DRUG SCREEN, HOSP PERFORMED  URINALYSIS, ROUTINE W REFLEX MICROSCOPIC  I-STAT TROPONIN, ED    EKG  EKG Interpretation  Date/Time:  Tuesday February 08 2017 16:43:11 EDT Ventricular Rate:  61 PR Interval:    QRS Duration: 95 QT Interval:  428 QTC Calculation: 432 R Axis:   27 Text Interpretation:  Sinus rhythm Probable LVH with secondary repol abnrm No STEMI No old tracing to compare Confirmed by Drema Pry [phone]) on 02/08/2017 5:22:30 PM       Radiology Ct Head Code Stroke W/o Cm  Result Date: 02/08/2017 CLINICAL DATA:  Code stroke. New onset dysphagia. Previous ACA territory infarct. Left hemi paresis. EXAM: CT HEAD WITHOUT CONTRAST TECHNIQUE: Contiguous axial images were obtained from the base of the skull through the vertex without intravenous contrast.  COMPARISON:  None. FINDINGS: Brain: A subacute/early chronic right ACA territory infarct is noted. There is some volume loss. No associated hemorrhage is present. A more remote medial and inferior right occipital lobe infarct is present. Ischemic changes within the right internal capsule appear remote as well. No acute cortical infarct, hemorrhage, or mass lesion is present. The basal ganglia are otherwise intact. The insular ribbon is normal. No other focal cortical lesions are present. The ventricles are of normal size. No significant extra-axial fluid collection is present. Vascular: No hyperdense vessel or unexpected calcification. Skull: The calvarium is intact. No focal lytic or blastic lesions are present. Sinuses/Orbits: The right maxillary sinus is opacified. The ostiomeatal complex is occluded. Anterior right ethmoid and frontal sinus opacification is noted as well. The left paranasal sinuses and bilateral sphenoid sinuses are clear. The mastoid air cells are clear. ASPECTS [facility] Stroke Program Early CT Score) - Ganglionic level infarction (caudate, lentiform nuclei, internal capsule, insula, M1-M3 cortex): 7/7 - Supraganglionic infarction (M4-M6 cortex): 3/3 Total score (0-10 with 10 being normal): 10/10 IMPRESSION: 1. Subacute to early chronic right ACA territory infarcts without hemorrhage. There is some volume loss. 2. More remote infarct in the inferior medial right occipital lobe. 3. Remote infarct of the right internal capsule. 4. No acute infarct. 5. Extensive anterior right-sided sinus disease 6. ASPECTS is 10/10 The These results were called by telephone at the time of interpretation on 02/08/2017 at 4:27 pm to Dr. Wilford Corner, who verbally acknowledged these results. Electronically Signed   By: Marin Roberts M.D.   On: 02/08/2017 16:28    Procedures Procedures (including critical care time)  Medications Ordered in ED Medications  iopamidol (ISOVUE-370) 76 % injection (50 mLs  Contrast  Given 02/08/17 1853)     Initial Impression / Assessment and Plan / ED Course  I have reviewed the triage vital signs and the nursing notes.  Pertinent labs & imaging results that were available during my care of the patient were reviewed by me and considered in my medical decision making (see chart for details).     Code stroke initiated. Patient is not a TPA candidate date due to recent ischemic stroke.   CT without evidence  of intracranial hemorrhage. Patient evaluated by neurology who requested a CTA and MRI. They also requested admission for further workup and management.  Currently there is no evidence to suggest an infectious process that would be exacerbating his old stroke.  Case discussed with hospitalist who will make the patient. Neurology to follow.  Final Clinical Impressions(s) / ED Diagnoses   Final diagnoses:  Dysphagia, unspecified type      Lodema Parma, Amadeo Garnet, MD 02/08/17 [phone]

## 2017-02-08 NOTE — Code Documentation (Signed)
46 y.o. 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 Today staff at the facility noted him to have new onset dysphagia. Staff stated around 1315, he was unable to eat lunch or take his afternoon pills when before he had no issues. His wife reports he has had an increase in difficulty with ambulation and a few falls since Saturday. Today they met to discuss what his plan of care was to be and at the conclusion of the exam, had new onset dysphagia and thus EMS was called. EMS was notified and patient brought to [facility] ED via [facility] for speech evaluation.    On arrival to [facility] ED, RN noted new stroke like symptoms and called a code stroke. CT revealed subacute to early chronic right ACA territory infarcts without hemorrhage. More remote infarct in the inferior medial right occipital lobe. Remote infarct of the right internal capsule. No acute infarct. NIHSS 11. See EMR for NIHSS and code stroke times. On exam, patient with no movement to his LLE and movement with gravity eliminated to his LUE. IV tPA not given d/t being contraindicated d/t recent CVA. MRI to be obtained. ED bedside handoff with ED RN Santiago Glad.

## 2017-02-08 NOTE — ED Notes (Signed)
Attempted to call report

## 2017-02-08 NOTE — ED Notes (Signed)
Assumed care on pt. , pt. returned from CT scan , IV site intact , Dr. Ophelia CharterYates at bedside evaluating/ admitting pt.

## 2017-02-08 NOTE — ED Notes (Signed)
Pt to CT 1 

## 2017-02-08 NOTE — ED Notes (Signed)
Returned from CT scan. Report given to White StoneBobby, Charity fundraiserN.

## 2017-02-08 NOTE — ED Triage Notes (Addendum)
Pt arrives EMS from [facility] nursing [facility] with c/o difficulty swallowing that stated this afternoon. Ate breakfast with no problems then unable to swallow afternoon medication. Arrives with wifes request for MRI and neuro workup. Pt arrives with foley in place. Previous stroke 01/20/17

## 2017-02-08 NOTE — Consult Note (Signed)
Neurology Consultation  Reason for Consult: unable to swallow pills in the setting of recent stroke Referring Physician: ER attending - Dr. Eudelia Bunch  CC: Inability to swallow  History is obtained from Patient's wife  HPI: Frank Moss is a 46 y.o. male with a past medical history of uncontrolled diabetes and hypertension, hyperlipidemia, noncompliant to medications, who had a recent right ACA acute ischemic stroke, with residual left-sided hemiparesis and residual speech impairment who was in his rehabilitation facility where sometime around this afternoon was noted that he was unable to swallow his pills and his pills out. He was last seen normal sometime during this morning when he had his breakfast and was able to eat chew and swallow without problems. He was seen at [facility] on 01/20/2017 for acute onset of this right ACA stroke. I was able to access the reports of the MRIs via care everywhere that showed right ACA infarct probably because of severe stenosis of the right ACA and diffuse intracerebral atherosclerosis. He was started on dual antibiotic therapy and high-dose statin along with medications to control his blood pressures and discharged to rehabilitation. According to his wife, he had been doing well in terms of participating with therapy in the rehabilitation facility up until this morning when this change happened. There was no report of seizure-like activity or loss of consciousness. There was no report of any worsening weakness. No report of preceding fevers chills or illnesses. Patient was unable to provide any history reliably. History was obtained from his wife.  LKW: Before noon today tpa given?: no, recent stroke in less than 3 weeks Premorbid modified Rankin scale (mRS): 5  ROS: A 14 point ROS was performed and is negative except as noted in the HPI.  Past Medical History:  Diagnosis Date  . Abnormal ECG   . Acute ischemic right ACA stroke (HCC)   .  Coronary artery disease, occlusive   . Dysfunction of eustachian tube   . History of right ACA stroke   . Hyperlipidemia associated with type 2 diabetes mellitus (HCC)   . Hypertension   . Impotence of organic origin   . Lumbago   . Lymphadenitis    Unspecified, except mesenteric  . Type 2 diabetes, uncontrolled, with retinopathy (HCC)   . Urinary retention 01/2017     Family History  Problem Relation Age of Onset  . Hypertension Mother   . Cancer Neg Hx   . Diabetes Neg Hx   . Stroke Neg Hx      Social History:   reports that he has never smoked. He has never used smokeless tobacco. His alcohol and drug histories are not on file.   No current facility-administered medications for this encounter.   Current Outpatient Prescriptions:  .  aspirin 325 MG tablet, Take 325 mg by mouth daily., Disp: , Rfl:  .  atorvastatin (LIPITOR) 80 MG tablet, Take 80 mg by mouth daily., Disp: , Rfl:  .  cloNIDine (CATAPRES) 0.2 MG tablet, Take 0.2 mg by mouth 3 (three) times daily., Disp: , Rfl:  .  clopidogrel (PLAVIX) 75 MG tablet, Take 75 mg by mouth daily., Disp: , Rfl:  .  diltiazem (CARDIZEM) 90 MG tablet, Take 90 mg by mouth every 6 (six) hours., Disp: , Rfl:  .  escitalopram (LEXAPRO) 10 MG tablet, Take 10 mg by mouth daily., Disp: , Rfl:  .  insulin glargine (LANTUS) 100 UNIT/ML injection, Inject 15 Units into the skin at bedtime., Disp: , Rfl:  .  insulin lispro (HUMALOG) 100 UNIT/ML injection, Inject 5 Units into the skin. Give 5 units regular if BS >180 before meals TID, Disp: , Rfl:  .  metFORMIN (GLUCOPHAGE) 1000 MG tablet, Take 1,000 mg by mouth daily., Disp: , Rfl:  .  nitroGLYCERIN (NITROSTAT) 0.4 MG SL tablet, Place 0.4 mg under the tongue every 5 (five) minutes as needed for chest pain., Disp: , Rfl:  .  ondansetron (ZOFRAN) 4 MG tablet, Take 4 mg by mouth every 4 (four) hours as needed for nausea or vomiting., Disp: , Rfl:    Exam: Current vital signs: BP (!) 143/80 (BP  Location: Left Arm)   Pulse (!) 58   Temp 98.9 F (37.2 C) (Oral)   Resp 18   Ht 6\' 1"  (1.854 m) Comment: Simultaneous filing. User may not have seen previous data.  Wt 124.3 kg (274 lb)   SpO2 100%   BMI 36.15 kg/m  Vital signs in last 24 hours: Temp:  [97.8 F (36.6 C)-98.9 F (37.2 C)] 98.9 F (37.2 C) (07/24 1537) Pulse Rate:  [58-71] 58 (07/24 1537) Resp:  [18] 18 (07/24 1537) BP: (143-160)/(52-80) 143/80 (07/24 1537) SpO2:  [93 %-100 %] 100 % (07/24 1537) Weight:  [124.3 kg (274 lb)] 124.3 kg (274 lb) (07/24 1538)  GENERAL: Awake, alert in NAD HEENT: - Normocephalic and atraumatic, dry mm, no LN++, no Thyromegally LUNGS - Clear to auscultation bilaterally with no wheezes CV - S1S2 RRR, no m/r/g, equal pulses bilaterally. ABDOMEN - Soft, nontender, nondistended with normoactive BS Ext: warm, well perfused, intact peripheral pulses, __ edema  NEURO:  Mental Status: Awake, alert, oriented to place person and time. Generally slow in responding to questions Language: speech is dysarthric.  Naming, repetition, fluency, and comprehension mildly impaired. Cranial Nerves: PERRL__3__mm/brisk. EOMI, visual fields full but extinction to left on DSS, left nasolabial fold flattening observed along with left lower facial weakness facial sensation intact, hearing intact, soft palate does not elevate bilaterally,, normal sternocleidomastoid and trapezius muscle strength. No evidence of tongue atrophy or fibrillations Motor: Flaccid and plegic left lower extremity. Barely 2/5 left upper extremity. 5/5 right upper and right lower extremities. Tone: Flaccid on left lower extremity, normal elsewhere. Sensation- decreased sensation to light touch on the left, extinguishes on double simultaneous stimulation. Coordination: Finger-to-nose and heel-to-shin intact on the right, unable to perform on the left Gait- deferred  NIHSS 1a Level of Conscious.:  1b LOC Questions:  1c LOC Commands:  2  Best Gaze:  3 Visual:  4 Facial Palsy: 1 5a Motor Arm - left: 3 5b Motor Arm - Right:  6a Motor Leg - Left: 4 6b Motor Leg - Right:  7 Limb Ataxia:  8 Sensory: 1 9 Best Language: 1 10 Dysarthria: 1 11 Extinct. and Inatten.: 1 TOTAL: 12  Labs CBC    Component Value Date/Time   WBC 7.1 02/08/2017 1634   RBC 4.61 02/08/2017 1634   HGB 13.3 02/08/2017 1646   HCT 39.0 02/08/2017 1646   PLT 320 02/08/2017 1634   MCV 79.2 02/08/2017 1634   MCH 28.0 02/08/2017 1634   MCHC 35.3 02/08/2017 1634   RDW 13.5 02/08/2017 1634   LYMPHSABS 1.7 02/08/2017 1634   MONOABS 0.5 02/08/2017 1634   EOSABS 0.1 02/08/2017 1634   BASOSABS 0.1 02/08/2017 1634    CMP     Component Value Date/Time   NA 136 02/08/2017 1646   NA 138 01/26/2017   K 4.5 02/08/2017 1646   CL 97 (L)  02/08/2017 1646   CO2 29 11/16/2007 0927   GLUCOSE 174 (H) 02/08/2017 1646   BUN 23 (H) 02/08/2017 1646   BUN 35 (A) 01/26/2017   CREATININE 1.50 (H) 02/08/2017 1646   CALCIUM 9.1 11/16/2007 0927   PROT 6.1 11/16/2007 0927   ALBUMIN 3.8 11/16/2007 0927   AST 25 11/16/2007 0927   ALT 41 11/16/2007 0927   ALKPHOS 50 11/16/2007 0927   BILITOT 0.9 11/16/2007 0927   GFRNONAA 88 11/16/2007 0927   GFRAA 107 11/16/2007 0927     Lipid Panel     Component Value Date/Time   CHOL 263 (A) 01/22/2017   TRIG 298 (A) 01/22/2017   HDL 38 01/22/2017   CHOLHDL 7.6 CALC 11/16/2007 0927   VLDL 273 (H) 11/16/2007 0927   LDLCALC 165 01/22/2017   LDLDIRECT 85.0 11/16/2007 0927   Labs from care everywhere Labs from his recent hospitalization Hemoglobin A1c 10.2 LDL 165 Urine toxicology negative Negative serum ethanol Echocardiogram done on 01/24/2017 showed normal LV size, 55-60% left ventricular ejection fraction, moderate concentric LVH, mild aortic sclerosis, no wall motion abnormalities, normal left atrial size, report said saline contrast injection performed with no evidence of interatrial shunt.  Imaging I have  reviewed the images obtained: Noncontrast CT of the head showed right ACA territory strokes and also right internal capsule and remote right medial occipital lobe infarct.  MRI examination of the brain-pending  Assessment:  46 year old with multiple cardio vascular risk factors including uncontrolled diabetes, hyperlipidemia, hypertension and a recent right ACA stroke presenting to the emergency department for evaluation of inability to swallow his pills. On examination, his NIH stroke scale is 12, which goes along with the changes of his recent ACA stroke that are seen. I have no access to the discharge summary or examination notes from his outside hospitalization but I do have access to the reports of the MRI and MRA which show right anterior cerebral artery atherosclerosis and right ACA stroke. At this time, my suspicion is that his symptoms are related either to recrudescence of his symptoms versus progression of his recent ACA stroke. This would also be a new stroke or this could be abulia because of his recent frontal strokes. In either case, he is not a candidate for IV TPA because of the recent stroke. I will obtain vascular imaging to assess for the need for endovascular treatment.   Impression: Recent rt. ACA stroke secondary to intracranial atherosclerosis. Possible recrudescence v. progression of the stroke vs. New stroke vs. abulia  Recommendations: -CTA Head and neck stat --MRI brain -c/w ASA and PLAVIX -Recent echocardiogram reviewed, report as above. No need to repeat. -Recent A1c and lipid panel noted above. No need to repeat -Continue high-dose statin -ST/PT/OT eval -Check UA -Check CXR -Check CBC CMP -If MRI shows a new stroke, can allow for permissive hypertension. If the MRI is negative for stroke, blood pressure needs to be in the normal range of less than 140.  -Spoke at length with wife to obtain records from the outside hospital especially the compact discs with  imaging studies from the outside hospital for comparison. Discussed the plan with the attending physician in the emergency room as well as the patient's wife. Stroke team will continue to follow. Please call with questions.  Milon DikesAshish Jalexus Brett, MD [facility] [phone]  If 7pm to 7am, please call on call as listed on AMION.

## 2017-02-08 NOTE — Progress Notes (Signed)
DATE:  02/08/2017 MRN:  [id number]  BIRTHDAY: [birth year]  Facility:  Nursing Home Location:  [facility] Room Number: 309-A  LEVEL OF CARE:  SNF (31)  Contact Information    Name Relation Home Work Mobile   Galasso,[name]  [phone]         Code Status History    This patient does not have a recorded code status. Please follow your organizational policy for patients in this situation.       Chief Complaint  Patient presents with  . Acute Visit    Unable to swallow    HISTORY OF PRESENT ILLNESS:  This is a 1-YO male seen for an acute visit due to inability to swallow.  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     PAST MEDICAL HISTORY:  Past Medical History:  Diagnosis Date  . Abnormal ECG   . Acute ischemic right ACA stroke (HCC)   . Coronary artery disease, occlusive   . Dysfunction of eustachian tube   . History of right ACA stroke   . Hyperlipidemia associated with type 2 diabetes mellitus (HCC)   . Hypertension   . Impotence of organic origin   . Lumbago   . Lymphadenitis    Unspecified, except mesenteric  . Type 2 diabetes, uncontrolled, with retinopathy (HCC)   . Urinary retention 01/2017     CURRENT MEDICATIONS: Reviewed  Patient's Medications  New Prescriptions   No medications on file  Previous Medications   ASPIRIN 325 MG TABLET    Take 325 mg by mouth daily.   ATORVASTATIN (LIPITOR) 80 MG TABLET    Take 80 mg by mouth daily.   CLONIDINE (CATAPRES) 0.2 MG TABLET    Take 0.2 mg by mouth 3 (three) times daily.   CLOPIDOGREL (PLAVIX) 75 MG TABLET    Take 75 mg by mouth daily.   DILTIAZEM (CARDIZEM) 90 MG TABLET    Take 90 mg by mouth every 6 (six) hours.   ESCITALOPRAM (LEXAPRO) 10 MG TABLET    Take 10 mg by mouth daily.   INSULIN GLARGINE (LANTUS) 100 UNIT/ML INJECTION    Inject 15 Units into the skin at  bedtime.   INSULIN LISPRO (HUMALOG) 100 UNIT/ML INJECTION    Inject 5 Units into the skin. Give 5 units regular if BS >180 before meals TID   METFORMIN (GLUCOPHAGE) 1000 MG TABLET    Take 1,000 mg by mouth daily.   NITROGLYCERIN (NITROSTAT) 0.4 MG SL TABLET    Place 0.4 mg under the tongue every 5 (five) minutes as needed for chest pain.   ONDANSETRON (ZOFRAN) 4 MG TABLET    Take 4 mg by mouth every 4 (four) hours as needed for nausea or vomiting.  Modified Medications   No medications on file  Discontinued Medications   No medications on file     Allergies  Allergen Reactions  . Beta Adrenergic Blockers Other (See Comments)    fatigue     REVIEW OF SYSTEMS:  GENERAL: no change in appetite, no fatigue, no weight changes, no fever, chills or weakness SKIN: Denies rash, itching, wounds, ulcer sores, or nail abnormality EYES: Denies change in vision, dry eyes, eye pain, itching or discharge EARS: Denies change in hearing, ringing in ears, or earache NOSE: Denies nasal congestion or epistaxis MOUTH and THROAT: Denies oral discomfort, gingival pain or bleeding, pain from teeth or hoarseness  RESPIRATORY: no cough, SOB, DOE, wheezing, hemoptysis CARDIAC: no chest pain, edema or palpitations GI: no abdominal pain, diarrhea, constipation, heart burn, nausea or vomiting GU: Denies dysuria, frequency, hematuria, incontinence, or discharge MUSCULOSKELETAL: Denies joit pain, muscle pain, back pain, restricted movement, or unusual weakness CIRCULATION: Denies claudication, edema of legs, varicosities, or cold extremities NEUROLOGICAL: Denies dizziness, syncope, numbness, or headache PSYCHIATRIC: Denies feeling of depression or anxiety. No report of hallucinations, insomnia, paranoia, or agitation ENDOCRINE: Denies polyphagia, polyuria, polydipsia, heat or cold intolerance HEME/LYMPH: Denies excessive bruising, petechia, enlarged lymph nodes, or bleeding problems IMMUNOLOGIC: Denies history  of frequent infections, AIDS, or use of immunosuppressive agents   PHYSICAL EXAMINATION  GENERAL APPEARANCE: Well nourished. In no acute distress. Normal body habitus SKIN:  Skin is warm and dry. There are no suspicious lesions or rash HEAD: Normal in size and contour. No evidence of trauma EYES: Lids open and close normally. No blepharitis, entropion or ectropion. PERRL. Conjunctivae are clear and sclerae are white. Lenses are without opacity EARS: Pinnae are normal. Patient hears normal voice tunes of the examiner MOUTH and THROAT: Lips are without lesions. Oral mucosa is moist and without lesions. Tongue is normal in shape, size, and color and without lesions NECK: supple, trachea midline, no neck masses, no thyroid tenderness, no thyromegaly LYMPHATICS: no LAN in the neck, no supraclavicular LAN RESPIRATORY: breathing is even & unlabored, BS CTAB CARDIAC: RRR, no murmur,no extra heart sounds, no edema GI: abdomen soft, normal BS, no masses, no tenderness, no hepatomegaly, no splenomegaly MUSCULOSKELETAL: No deformities. Movement at each extremity is full and painless. Strength is 5/5 at each extremity. Back is without kyphosis or scoliosis CIRCULATION: pedal pulses are 2+. There is no edema of the legs, ankles and feet NEUROLOGICAL: There is no tremor. Speech is clear PSYCHIATRIC: Alert and oriented X 3. Affect and behavior are appropriate  LABS/RADIOLOGY: Labs reviewed: Basic Metabolic Panel:  Recent Labs  14/78/2906/04/05 01/25/17 01/26/17  NA 136* 138 138  K 4.5 4.2 4.1  BUN 32* 30* 35*  CREATININE 1.8* 1.6* 1.8*   CBC:  Recent Labs  01/20/17 01/21/17 01/22/17  WBC 8.2 8.8 6.5  HGB 12.8* 12.1* 11.6*  HCT 37* 35* 34*  PLT 283 293 298   Lipid Panel:  Recent Labs  01/22/17  HDL 38    ASSESSMENT/PLAN:        Monina C. Medina-Vargas - NP    [facility] [phone]

## 2017-02-08 NOTE — ED Notes (Signed)
IV attempted x 2 withput success whi,le in ct.

## 2017-02-08 NOTE — Progress Notes (Signed)
Pt arrived to the unit. Alert and oriented x4. Wife was at bedside but left shortly after the patient was settled in bed. Denies any pain or discomfort. Notified provider on call for pt arrival. No distress noted. Will continue monitoring.

## 2017-02-08 NOTE — ED Notes (Signed)
Dr. Eudelia Bunchardama states to treat as Code stroke.

## 2017-02-08 NOTE — ED Notes (Signed)
ED Provider at bedside. 

## 2017-02-09 ENCOUNTER — Observation Stay (HOSPITAL_COMMUNITY): Payer: 59

## 2017-02-09 DIAGNOSIS — E11319 Type 2 diabetes mellitus with unspecified diabetic retinopathy without macular edema: Secondary | ICD-10-CM | POA: Diagnosis present

## 2017-02-09 DIAGNOSIS — R29712 NIHSS score 12: Secondary | ICD-10-CM | POA: Diagnosis present

## 2017-02-09 DIAGNOSIS — Z79899 Other long term (current) drug therapy: Secondary | ICD-10-CM | POA: Diagnosis not present

## 2017-02-09 DIAGNOSIS — R339 Retention of urine, unspecified: Secondary | ICD-10-CM | POA: Diagnosis present

## 2017-02-09 DIAGNOSIS — D62 Acute posthemorrhagic anemia: Secondary | ICD-10-CM | POA: Diagnosis not present

## 2017-02-09 DIAGNOSIS — R471 Dysarthria and anarthria: Secondary | ICD-10-CM | POA: Diagnosis present

## 2017-02-09 DIAGNOSIS — E1159 Type 2 diabetes mellitus with other circulatory complications: Secondary | ICD-10-CM | POA: Diagnosis not present

## 2017-02-09 DIAGNOSIS — Z7902 Long term (current) use of antithrombotics/antiplatelets: Secondary | ICD-10-CM | POA: Diagnosis not present

## 2017-02-09 DIAGNOSIS — E0810 Diabetes mellitus due to underlying condition with ketoacidosis without coma: Secondary | ICD-10-CM | POA: Diagnosis not present

## 2017-02-09 DIAGNOSIS — E46 Unspecified protein-calorie malnutrition: Secondary | ICD-10-CM | POA: Diagnosis not present

## 2017-02-09 DIAGNOSIS — N183 Chronic kidney disease, stage 3 unspecified: Secondary | ICD-10-CM | POA: Diagnosis present

## 2017-02-09 DIAGNOSIS — I10 Essential (primary) hypertension: Secondary | ICD-10-CM

## 2017-02-09 DIAGNOSIS — E1151 Type 2 diabetes mellitus with diabetic peripheral angiopathy without gangrene: Secondary | ICD-10-CM

## 2017-02-09 DIAGNOSIS — R0989 Other specified symptoms and signs involving the circulatory and respiratory systems: Secondary | ICD-10-CM | POA: Diagnosis not present

## 2017-02-09 DIAGNOSIS — I672 Cerebral atherosclerosis: Secondary | ICD-10-CM | POA: Diagnosis present

## 2017-02-09 DIAGNOSIS — Z9114 Patient's other noncompliance with medication regimen: Secondary | ICD-10-CM | POA: Diagnosis not present

## 2017-02-09 DIAGNOSIS — I63521 Cerebral infarction due to unspecified occlusion or stenosis of right anterior cerebral artery: Secondary | ICD-10-CM | POA: Diagnosis not present

## 2017-02-09 DIAGNOSIS — R7309 Other abnormal glucose: Secondary | ICD-10-CM | POA: Diagnosis not present

## 2017-02-09 DIAGNOSIS — G8110 Spastic hemiplegia affecting unspecified side: Secondary | ICD-10-CM | POA: Diagnosis not present

## 2017-02-09 DIAGNOSIS — F329 Major depressive disorder, single episode, unspecified: Secondary | ICD-10-CM | POA: Diagnosis present

## 2017-02-09 DIAGNOSIS — Z794 Long term (current) use of insulin: Secondary | ICD-10-CM | POA: Diagnosis not present

## 2017-02-09 DIAGNOSIS — I169 Hypertensive crisis, unspecified: Secondary | ICD-10-CM | POA: Diagnosis not present

## 2017-02-09 DIAGNOSIS — E1165 Type 2 diabetes mellitus with hyperglycemia: Secondary | ICD-10-CM | POA: Diagnosis present

## 2017-02-09 DIAGNOSIS — I69391 Dysphagia following cerebral infarction: Secondary | ICD-10-CM | POA: Diagnosis not present

## 2017-02-09 DIAGNOSIS — I69319 Unspecified symptoms and signs involving cognitive functions following cerebral infarction: Secondary | ICD-10-CM | POA: Diagnosis not present

## 2017-02-09 DIAGNOSIS — E871 Hypo-osmolality and hyponatremia: Secondary | ICD-10-CM | POA: Diagnosis not present

## 2017-02-09 DIAGNOSIS — I2510 Atherosclerotic heart disease of native coronary artery without angina pectoris: Secondary | ICD-10-CM | POA: Diagnosis present

## 2017-02-09 DIAGNOSIS — E782 Mixed hyperlipidemia: Secondary | ICD-10-CM | POA: Diagnosis present

## 2017-02-09 DIAGNOSIS — I639 Cerebral infarction, unspecified: Secondary | ICD-10-CM | POA: Diagnosis present

## 2017-02-09 DIAGNOSIS — R414 Neurologic neglect syndrome: Secondary | ICD-10-CM | POA: Diagnosis present

## 2017-02-09 DIAGNOSIS — E1122 Type 2 diabetes mellitus with diabetic chronic kidney disease: Secondary | ICD-10-CM | POA: Diagnosis present

## 2017-02-09 DIAGNOSIS — R1310 Dysphagia, unspecified: Secondary | ICD-10-CM

## 2017-02-09 DIAGNOSIS — R4189 Other symptoms and signs involving cognitive functions and awareness: Secondary | ICD-10-CM | POA: Diagnosis present

## 2017-02-09 DIAGNOSIS — I6930 Unspecified sequelae of cerebral infarction: Secondary | ICD-10-CM | POA: Diagnosis not present

## 2017-02-09 DIAGNOSIS — G8194 Hemiplegia, unspecified affecting left nondominant side: Secondary | ICD-10-CM | POA: Diagnosis present

## 2017-02-09 DIAGNOSIS — E669 Obesity, unspecified: Secondary | ICD-10-CM | POA: Diagnosis present

## 2017-02-09 DIAGNOSIS — Z6829 Body mass index (BMI) 29.0-29.9, adult: Secondary | ICD-10-CM | POA: Diagnosis not present

## 2017-02-09 DIAGNOSIS — I129 Hypertensive chronic kidney disease with stage 1 through stage 4 chronic kidney disease, or unspecified chronic kidney disease: Secondary | ICD-10-CM | POA: Diagnosis present

## 2017-02-09 DIAGNOSIS — E119 Type 2 diabetes mellitus without complications: Secondary | ICD-10-CM | POA: Diagnosis not present

## 2017-02-09 LAB — GLUCOSE, CAPILLARY
GLUCOSE-CAPILLARY: 130 mg/dL — AB (ref 65–99)
GLUCOSE-CAPILLARY: 93 mg/dL (ref 65–99)
Glucose-Capillary: 95 mg/dL (ref 65–99)
Glucose-Capillary: 120 mg/dL — ABNORMAL HIGH (ref 65–99)
Glucose-Capillary: 124 mg/dL — ABNORMAL HIGH (ref 65–99)

## 2017-02-09 LAB — LIPID PANEL
CHOL/HDL RATIO: 3.5 ratio
Cholesterol: 138 mg/dL (ref 0–200)
HDL: 39 mg/dL — AB (ref >40)
LDL CALC: 73 mg/dL (ref 0–99)
TRIGLYCERIDES: 128 mg/dL (ref <150)
VLDL: 26 mg/dL (ref 0–40)

## 2017-02-09 LAB — MRSA PCR SCREENING: MRSA by PCR: NEGATIVE

## 2017-02-09 LAB — HIV ANTIBODY (ROUTINE TESTING W REFLEX): HIV Screen 4th Generation wRfx: NONREACTIVE

## 2017-02-09 LAB — TROPONIN I
Troponin I: <0.03 ng/mL (ref <0.03)
Troponin I: <0.03 ng/mL (ref <0.03)

## 2017-02-09 MED ORDER — INSULIN ASPART 100 UNIT/ML ~~LOC~~ SOLN
0.0000 [IU] | Freq: Three times a day (TID) | SUBCUTANEOUS | Status: DC
  Administered 2017-02-10 (×2): 3 [IU] via SUBCUTANEOUS

## 2017-02-09 MED ORDER — INSULIN ASPART 100 UNIT/ML ~~LOC~~ SOLN: 0.0000 [IU] | Freq: Every day | SUBCUTANEOUS | Status: DC

## 2017-02-09 NOTE — Evaluation (Addendum)
Occupational Therapy Evaluation   Patient Details Name: Frank Moss MRN: [id number] DOB: 1970-08-17 Today's Date: 02/09/2017    History of Present Illness Pt is a 46 y.o. male with PMH of DM, HTN, HLD, CAD, CKD, ED, and acute ischemic right ACA stroke earlier this month for which he is actively receiving SNF rehab at [facility] presenting with acute dysphagia today. Pt was receiving rehab services after hospital admission 7/5-7/11 at [facility]. About a week ago could no longer sit up and needed PT support, fell on Saturday. Eating has been "erratic"- does not like food at SNF. On 7/24 was unable to swallow medications or water. There were concerns about possible UTI or depression. MRI showed subcentimeter acute infarction within R anterior thalamus, subacute R ACA infarction, CXR negative.   Clinical Impression   Pt came to [facility] from [facility] where he was in rehab from previous stroke. Pt is currently max A for all ADL and max +2 for stand pivot transfers (RN staff recommended that they use maxi move). Pt saying very little during session and only giving one or two word responses during session, required multi-modal cueing throughout. Pt will require skilled OT in the acute setting to maximize safety and independence in ADL and functional transfers. Pt will also require CIR level therapy to not only maximize potential independence, but also to work with caregiver to provide caregiver education.    Follow Up Recommendations   CIR; Supervision 24 hours    Equipment Recommendations  Other (comment) (defer to next venue)    Recommendations for Other Services       Precautions / Restrictions Precautions Precautions: Fall Restrictions Weight Bearing Restrictions: No      Mobility Bed Mobility Overal bed mobility: Needs Assistance Bed Mobility: Rolling;Sidelying to Sit Rolling: Mod assist;+2 for safety/equipment Sidelying to sit: Mod assist;+2 for physical assistance       General  bed mobility comments: Pt required multimodal cueing for sequencing, and mod +2 assist for rolling and trunk elevation  Transfers Overall transfer level: Needs assistance Equipment used: 2 person hand held assist Transfers: Sit to/from [employer] Transfers Sit to Stand: Mod assist;+2 physical assistance Stand pivot transfers: Max assist;+2 physical assistance;+2 safety/equipment       General transfer comment: sit<>stand x2 from bed with blocking out of LLE and vc to bring body weight to the right. multimodal cues during stand pivot with max A +2 to the left, Pt with heavy lean to the left during transfers    Balance Overall balance assessment: Needs assistance Sitting-balance support: Single extremity supported;Feet supported Sitting balance-Leahy Scale: Poor Sitting balance - Comments: cues to use RUE to hold on at bedrail, when holding on, able to sit min guard - with no UE support Pt is a mod A to maintain sitting EOB Postural control: Left lateral lean;Posterior lean Standing balance support: Single extremity supported Standing balance-Leahy Scale: Poor Standing balance comment: reliant on support from 2 therapists to remain standing, heavy left lean, blocking on Left knee                           ADL either performed or assessed with clinical judgement   ADL Overall ADL's : Needs assistance/impaired Eating/Feeding: NPO   Grooming: Wash/dry hands;Minimal assistance;Sitting Grooming Details (indicate cue type and reason): mod A to sit EOB and perform grooming as Pt requires assist from RUE for seated balance Upper Body Bathing: Maximal assistance   Lower Body Bathing: Maximal  assistance   Upper Body Dressing : Maximal assistance   Lower Body Dressing: Maximal assistance   Toilet Transfer: Maximal assistance;+2 for physical assistance;+2 for safety/equipment;Stand-pivot;BSC Toilet Transfer Details (indicate cue type and reason): simulated through recliner  transfer. Pt has foley Toileting- Clothing Manipulation and Hygiene: Total assistance       Functional mobility during ADLs: Maximal assistance;+2 for physical assistance;+2 for safety/equipment;Cueing for safety;Cueing for sequencing       Vision Baseline Vision/History: Wears glasses Wears Glasses: At all times       Perception     Praxis      Pertinent Vitals/Pain Pain Assessment: Faces Faces Pain Scale: Hurts a little bit Pain Location: sore in left leg Pain Descriptors / Indicators: Sore Pain Intervention(s): Monitored during session;Repositioned     Hand Dominance     Extremity/Trunk Assessment Upper Extremity Assessment Upper Extremity Assessment: LUE deficits/detail LUE Deficits / Details: Pt grossly 2/5, able to make fist - but unable to hold. Pt reports no difference in sensory, however MD neurology states sensory loss on left LUE Sensation: decreased light touch LUE Coordination: decreased fine motor;decreased gross motor   Lower Extremity Assessment Lower Extremity Assessment: Defer to PT evaluation   Cervical / Trunk Assessment Cervical / Trunk Assessment: Normal   Communication Communication Communication: Expressive difficulties   Cognition Arousal/Alertness: Awake/alert Behavior During Therapy: Flat affect Overall Cognitive Status: Impaired/Different from baseline Area of Impairment: Attention;Following commands;Safety/judgement;Awareness                   Current Attention Level: Sustained   Following Commands: Follows one step commands inconsistently;Follows one step commands with increased time Safety/Judgement: Decreased awareness of safety;Decreased awareness of deficits Awareness: Intellectual       General Comments  Pt with very limited one or two word responses during session. 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[employer])        OT Problem List: Decreased strength;Decreased range of motion;Decreased activity tolerance;Impaired balance (sitting and/or standing);Decreased coordination;Decreased cognition;Decreased safety awareness;Decreased knowledge of use of DME or AE;Impaired sensation;Impaired UE functional use      OT Treatment/Interventions:      OT Goals(Current goals can be found in the care plan section) Acute Rehab OT Goals Patient Stated Goal: none stated OT Goal Formulation: Patient unable to participate in goal setting  OT Frequency:     Barriers to D/C:            Co-evaluation PT/OT/SLP Co-Evaluation/Treatment: Yes Reason for Co-Treatment: Complexity of the patient's impairments (multi-system involvement);Necessary to address cognition/behavior during functional activity;For patient/therapist safety;To address functional/ADL transfers   OT goals addressed during session: ADL's and self-care      AM-PAC PT "6 Clicks" Daily Activity     Outcome Measure Help from another person eating meals?: Total Help  from another person taking care of personal grooming?: A Lot Help from another person toileting, which includes using toliet, bedpan, or urinal?: Total Help from another person bathing (including washing, rinsing, drying)?: A Lot Help from another person to put on and taking off regular upper body clothing?: A Lot Help from another person to put on and taking off regular lower body clothing?: Total 6 Click Score: 9   End of  Session Equipment Utilized During Treatment: Gait belt Nurse Communication: Mobility status;Need for lift equipment  Activity Tolerance: Patient tolerated treatment well Patient left: in chair;with call bell/phone within reach;with chair alarm set  OT Visit Diagnosis: Unsteadiness on feet (R26.81);Other abnormalities of gait and mobility (R26.89);Muscle weakness (generalized) (M62.81);Feeding difficulties (R63.3);Other symptoms and signs involving the nervous system (R29.898);Other symptoms and signs involving cognitive function;Cognitive communication deficit (R41.841);Hemiplegia and hemiparesis Symptoms and signs involving cognitive functions: Cerebral infarction Hemiplegia - Right/Left: Left Hemiplegia - dominant/non-dominant: Non-Dominant Hemiplegia - caused by: Cerebral infarction                Time: 1610-9604: 0921-0944 OT Time Calculation (min): 23 min Charges:  OT General Charges $OT Visit: 1 Procedure OT Evaluation $OT Eval Moderate Complexity: 1 Procedure G-Codes: OT G-codes **NOT FOR INPATIENT CLASS** Functional Assessment Tool Used: AM-PAC 6 Clicks Daily Activity Functional Limitation: Self care Self Care Current Status ([id number]): At least 60 percent but less than 80 percent impaired, limited or restricted Self Care Goal Status (J1914(G8988): At least 40 percent but less than 60 percent impaired, limited or restricted Self Care Discharge Status [id number]([id number]): At least 60 percent but less than 80 percent impaired, limited or restricted   Sherryl MangesLaura Yovanni Frenette OTR/L [phone]  Evern BioLaura J Kimi Kroft 02/09/2017, 10:45 AM   Addendum: changed recommendation to CIR as a result of consultation with other partners of medical team

## 2017-02-09 NOTE — Progress Notes (Signed)
STROKE TEAM PROGRESS NOTE   HPI: Frank Moss is a 46 y.o. 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 He was last seen normal sometime during this morning when he had his breakfast and was able to eat chew and swallow without problems.  He was seen at [facility] on 01/20/2017 for acute onset of this right ACA stroke. I was able to access the reports of the MRIs via care everywhere that showed right ACA infarct probably because of severe stenosis of the right ACA and diffuse intracerebral atherosclerosis. He was started on dual antibiotic therapy and high-dose statin along with medications to control his blood pressures and discharged to rehabilitation. According to his wife, he had been doing well in terms of participating with therapy in the rehabilitation facility up until this morning when this change happened. There was no report of seizure-like activity or loss of consciousness. There was no report of any worsening weakness. No report of preceding fevers chills or illnesses.   Patient was unable to provide any history reliably. History was obtained from his wife.  LKW: Before noon today Premorbid modified Rankin scale (mRS): 5  Patient was not administered IV t-PA secondary to recent stroke. He was admitted to General Neurology for further evaluation and treatment.   SUBJECTIVE (INTERVAL HISTORY) His wife, daughter, and daughter's wife are at bedside.  The patient is awake, alert, and follows all commands appropriately.  The patient is able to verbalize some words with delay.  Patient was evaluated by speech therapy and approved for a dysphagia diet.  On rounds the  patient was noted to be retaining pills in his mouth after his wife Stick him speak, but he then swallowed them as soon as his wife noticed.  Advanced sinus disease noted on imaging.  HIV antibody test non-reactive.   OBJECTIVE Temp:  [97.8 F (36.6 C)-99.3 F (37.4 C)] 98.7 F (37.1 C) (07/25 0639) Pulse Rate:  [58-78] 73 (07/25 0639) Cardiac Rhythm: Normal sinus rhythm (07/25 0745) Resp:  [11-18] 18 (07/25 0639) BP: (118-172)/(52-89) 172/82 (07/25 0639) SpO2:  [93 %-100 %] 96 % (07/25 0441) Weight:  [103.7 kg (228 lb 11.2 oz)-124.3 kg (274 lb)] 103.7 kg (228 lb 11.2 oz) (07/24 2052)  CBC:  Recent Labs Lab 02/08/17 1634 02/08/17 1646  WBC 7.1  --   NEUTROABS 4.7  --   HGB 12.9* 13.3  HCT 36.5* 39.0  MCV 79.2  --   PLT 320  --     Basic Metabolic Panel:  Recent Labs Lab 02/08/17 1634 02/08/17 1646  NA 136 136  K 4.5 4.5  CL 100* 97*  CO2 29  --   GLUCOSE 176* 174*  BUN 20 23*  CREATININE 1.59* 1.50*  CALCIUM 8.9  --     Lipid Panel:    Component Value Date/Time   CHOL 138 02/09/2017 0653   TRIG 128 02/09/2017 0653   HDL 39 (L) 02/09/2017 0653   CHOLHDL 3.5 02/09/2017 0653   VLDL 26 02/09/2017 0653   LDLCALC 73 02/09/2017 0653   HgbA1c:  Lab Results  Component Value Date   HGBA1C 11.2 01/21/2017   Urine Drug Screen: No results found for: LABOPIA, COCAINSCRNUR, LABBENZ, AMPHETMU, THCU, LABBARB  Alcohol  Level No results found for: ETH  IMAGING  Ct Angio Head W Or Wo Contrast Ct Angio Neck W And/or Wo Contrast 02/08/2017 IMPRESSION: 1. Negative CTA for emergent large vessel occlusion. 2. Bilateral severe V4 stenoses as above. 3. Multifocal moderate to severe atheromatous is stenoses involving the bilateral A2 and P2 segments as above, most severe within the right PCA. 4. Negative CTA of the neck without flow-limiting stenosis or significant atheromatous disease.    Dg Chest 2 View 02/08/2017 IMPRESSION: No active cardiopulmonary disease.  Mr Brain Wo  Contrast 02/09/2017 IMPRESSION: 1. Subcentimeter acute infarction within right anterior thalamus. No acute hemorrhage. 2. Right ACA distribution subacute infarction. Mild local mass effect. No herniation. 3. Background of chronic microvascular ischemic changes and parenchymal volume loss of the brain, advanced for age. 4. Chronic right occipital infarction. 5. Paranasal sinus disease with a right middle meatus obstructive pattern, direct visualization recommended.  Ct Head Code Stroke W/o Cm 02/08/2017 IMPRESSION: 1. Subacute to early chronic right ACA territory infarcts without hemorrhage. There is some volume loss. 2. More remote infarct in the inferior medial right occipital lobe. 3. Remote infarct of the right internal capsule. 4. No acute infarct. 5. Extensive anterior right-sided sinus disease 6. ASPECTS is 10/10      PHYSICAL EXAM Pleasant middle-aged African-American male currently not in distress. . Afebrile. Head is nontraumatic. Neck is supple without bruit.    Cardiac exam no murmur or gallop. Lungs are clear to auscultation. Distal pulses are well felt. Neurological Exam :  Awake alert oriented to race and person. Patient is generally abulic with prolonged delay in answering and following commands. Marland Kitchen. Speech is dysarthric. Able to follow simple midline and one-step commands. Extraocular movements are full range without nystagmus. Right lower facial weakness. Tongue midline. Left hemiplegia with 2/5 left upper extremity proximally in 1/5 distally. Left lower extremity grade 3-4/5 but effort is variable. Tone is diminished on left side. Sensation appears diminished in the left compared to the right. Deep tendon reflexes are symmetric. Plantars are downgoing. ASSESSMENT/PLAN Mr. Frank Moss is a 46 y.o. male with history of uncontrolled diabetes and hypertension, hyperlipidemia, noncompliant to medications, who had a recent right ACA acute ischemic stroke, with residual left-sided  hemiparesis and residual speech impairment who was in his rehabilitation facility where sometime around this afternoon was noted that he was unable to swallow his pill. He did not receive IV t-PA due to recent stroke.   Stroke: Subcentimeter acute infarction within right anterior thalamus, likely embolic in the setting of multiple prior infarcts in multiple distributions in the setting of severe A2 and P2 segment atheromatous stenosis, advanced small vessel disease, and poorly controlled diabetes.  Resultant  abulia and left hemiparesis  CT head:  No acute infarct  MRI head: Subcentimeter acute infarction within right anterior thalamus  MRA head  not ordered see outside hospital  Carotid Doppler   not ordered  2D Echo   not ordered  LDL 73  HgbA1c 11.2  SCDs for VTE prophylaxis  Diet NPO time specified  aspirin 325 mg daily and clopidogrel 75 mg daily prior to admission, now on aspirin 325 mg daily and clopidogrel 75 mg daily  Patient counseled to be compliant with his antithrombotic medications  Ongoing aggressive stroke risk factor management  Therapy recommendations: SNF;Supervision/Assistance - 24 hour    Disposition:   pending  Hypertension  Stable  Permissive hypertension (OK if < 220/120) but gradually normalize in 5-7 days  Long-term BP goal normotensive  Hyperlipidemia  Home meds:  Atorvastatin 80mg  PO, resumed in hospital  LDL 73, goal < 70  Continue statin at discharge  Diabetes  HgbA1c 11.2, goal < 7.0  Uncontrolled  Other Stroke Risk Factors  Borderline obesity, Body mass index is 29.36 kg/m., recommend weight loss, diet and exercise as appropriate   Hx stroke/TIA  Other Active Problems  None  Hospital day # 0  I have personally examined this patient, reviewed notes, independently viewed imaging studies, participated in medical decision making and plan of care.ROS completed by me personally and pertinent positives fully documented  I  have made any additions or clarifications directly to the above note.  He presented with increasing swelling difficulties yesterday without significant worsening of his left hemiparesis as per his wife. I have reviewed his electronic medical records for [facility]. He was found to have a right ACA stroke with atherosclerotic changes described in the A2 segment of the anterior cerebral artery and posterior cerebral artery in the P2 segments. However there is no significant atherosclerosis noted in the aortic arch and carotid bifurcations are ICA terminus. Patient did not get a transesophageal echocardiogram or prolonged cardiac monitoring for A. fib which I think is necessary. I had a long discussion with the patient and his wife at the bedside and answered questions. Continue aspirin and Plavix for stroke prevention for now. No further stroke workup is necessary. Continue therapy consults. Discussed with Dr. Benjamine Mola. Greater than 50% time during this 35 minute visit was spent on counseling and coordination of care about his stroke, discussion about prevention and treatment in answering questions  Delia Heady, MD Medical Director [facility] Pager: [phone] 02/09/2017 5:34 PM   To contact Stroke Continuity provider, please refer to [email or web address]. After hours, contact General Neurology

## 2017-02-09 NOTE — Progress Notes (Signed)
Paged the MD on call for the patient's MRI result. Waiting for her response. Pt is alert/oriented. Delayed response. No distress or discomfort noted

## 2017-02-09 NOTE — NC FL2 (Signed)
[place] MEDICAID FL2 LEVEL OF CARE SCREENING TOOL     IDENTIFICATION  Patient Name: Frank Moss Birthdate: March 27, 1971 Sex: male Admission Date (Current Location): 02/08/2017  [facility] [place]   Facility and Address:  [facility] [place], [address], [place], [place] [address]      Provider Number: [id number]  Attending Physician Name and Address:  Joseph ArtVann, Jessica U, DO  Relative Name and Phone Number:       Current Level of Care: Hospital Recommended Level of Care: Skilled Nursing Facility Prior Approval Number:    Date Approved/Denied:   PASRR Number: [id number] [id number]  Discharge Plan: SNF    Current Diagnoses: Patient Active Problem List   Diagnosis Date Noted  . CKD (chronic kidney disease), stage III 02/09/2017  . Dysphagia 02/08/2017  . Acute ischemic right ACA stroke (HCC) 01/31/2017  . Mixed hyperlipidemia 11/17/2007  . Accelerated hypertension 11/17/2007  . DM (diabetes mellitus), type 2 with peripheral vascular complications (HCC) 10/09/2007  . ERECTILE DYSFUNCTION, MILD 10/09/2007  . SHOULDER PAIN, LEFT 10/09/2007  . COLONIC POLYPS, HX OF 10/09/2007    Orientation RESPIRATION BLADDER Height & Weight     Self, Time, Situation, Place  Normal Incontinent Weight: 228 lb 11.2 oz (103.7 kg) Height:  6\' 2"  (188 cm)  BEHAVIORAL SYMPTOMS/MOOD NEUROLOGICAL BOWEL NUTRITION STATUS      Incontinent    AMBULATORY STATUS COMMUNICATION OF NEEDS Skin   Extensive Assist Verbally Normal                       Personal Care Assistance Level of Assistance  Bathing, Dressing Bathing Assistance: Maximum assistance   Dressing Assistance: Maximum assistance     Functional Limitations Info             SPECIAL CARE FACTORS FREQUENCY  PT (By licensed PT), OT (By licensed OT)     PT Frequency: 5x/wk OT Frequency: 5x/wk            Contractures      Additional Factors Info  Code Status, Allergies, Psychotropic,  Insulin Sliding Scale Code Status Info: Full Allergies Info: Beta Adrenergic Blockers Psychotropic Info: Lexapro 10mg  Insulin Sliding Scale Info: 3x/day       Current Medications (02/09/2017):  This is the current hospital active medication list Current Facility-Administered Medications  Medication Dose Route Frequency Provider Last Rate Last Dose  . acetaminophen (TYLENOL) tablet 650 mg  650 mg Oral Q4H PRN Jonah BlueYates, Jennifer, MD       Or  . acetaminophen (TYLENOL) solution 650 mg  650 mg Per Tube Q4H PRN Jonah BlueYates, Jennifer, MD       Or  . acetaminophen (TYLENOL) suppository 650 mg  650 mg Rectal Q4H PRN Jonah BlueYates, Jennifer, MD      . aspirin EC tablet 325 mg  325 mg Oral Daily Jonah BlueYates, Jennifer, MD   325 mg at 02/09/17 1111  . atorvastatin (LIPITOR) tablet 80 mg  80 mg Oral Noemi ChapelQHS Yates, Jennifer, MD      . bisacodyl (DULCOLAX) suppository 10 mg  10 mg Rectal Once PRN Jonah BlueYates, Jennifer, MD      . clopidogrel (PLAVIX) tablet 75 mg  75 mg Oral Daily Jonah BlueYates, Jennifer, MD   75 mg at 02/09/17 1111  . enoxaparin (LOVENOX) injection 40 mg  40 mg Subcutaneous Q24H Jonah BlueYates, Jennifer, MD   40 mg at 02/08/17 2246  . escitalopram (LEXAPRO) tablet 10 mg  10 mg Oral Daily Jonah BlueYates, Jennifer,  MD   10 mg at 02/09/17 1111  . insulin aspart (novoLOG) injection 0-15 Units  0-15 Units Subcutaneous TID WC Vann, Jessica U, DO      . insulin aspart (novoLOG) injection 0-5 Units  0-5 Units Subcutaneous QHS Vann, Jessica U, DO      . insulin glargine (LANTUS) injection 15 Units  15 Units Subcutaneous QHS Jonah BlueYates, Jennifer, MD   15 Units at 02/08/17 2248  . senna-docusate (Senokot-S) tablet 1 tablet  1 tablet Oral QHS PRN Jonah BlueYates, Jennifer, MD      . sodium phosphate (FLEET) 7-19 GM/118ML enema 1 enema  1 enema Rectal Once PRN Jonah BlueYates, Jennifer, MD         Discharge Medications: Please see discharge summary for a list of discharge medications.  Relevant Imaging Results:  Relevant Lab Results:   Additional Information SS#:  [id number]  Baldemar LenisElizabeth M Kmari Halter, LCSW

## 2017-02-09 NOTE — Evaluation (Addendum)
Physical Therapy Evaluation Patient Details Name: Frank Moss MRN: [id number] DOB: Nov 03, 1970 Today's Date: 02/09/2017   History of Present Illness  Pt is a 46 y.o. male with PMH of DM, HTN, HLD, CAD, CKD, ED, and acute ischemic right ACA stroke earlier this month for which he is [facility] presenting with acute dysphagia today. Pt was receiving rehab services after hospital [facility]. About a week ago could no longer sit up and needed PT support, fell on Saturday. Eating has been "erratic"- does not like food at SNF. On 7/24 was unable to swallow medications or water. There were concerns about possible UTI or depression. MRI showed subcentimeter acute infarction within R anterior thalamus, subacute R ACA infarction, CXR negative.  Clinical Impression  Orders received for PT evaluation. Patient demonstrates deficits in functional mobility as indicated below. Will benefit from continued skilled PT to address deficits and maximize function. Will see as indicated and progress as tolerated. Focus of session on evaluation, sitting balance and OOB to chair transfer. Patient requires +2 physical assist at all aspects of mobility. Given current deficits and need for continued rehabilitation, recommend comprehensive inpatient rehab for continued rehabilitation upon acute discharge.    Follow Up Recommendations CIR;Supervision/Assistance - 24 hour    Equipment Recommendations   (TBD)    Recommendations for Other Services       Precautions / Restrictions Precautions Precautions: Fall Restrictions Weight Bearing Restrictions: No      Mobility  Bed Mobility Overal bed mobility: Needs Assistance Bed Mobility: Rolling;Sidelying to Sit Rolling: Mod assist;+2 for safety/equipment Sidelying to sit: Mod assist;+2 for physical assistance       General bed mobility comments: Pt required multimodal cueing for sequencing, and mod +2 assist for  rolling and trunk elevation  Transfers Overall transfer level: Needs assistance Equipment used: 2 person hand held assist Transfers: Sit to/from BJ'sStand;Stand Pivot Transfers Sit to Stand: Mod assist;+2 physical assistance Stand pivot transfers: Max assist;+2 physical assistance;+2 safety/equipment       General transfer comment: sit<>stand x2 from bed with blocking out of LLE and vc to bring body weight to the right. multimodal cues during stand pivot with max A +2 to the left, Pt with heavy lean to the left during transfers  Ambulation/Gait                Stairs            Wheelchair Mobility    Modified Rankin (Stroke Patients Only) Modified Rankin (Stroke Patients Only) Pre-Morbid Rankin Score: Moderately severe disability Modified Rankin: Severe disability     Balance Overall balance assessment: Needs assistance Sitting-balance support: Single extremity supported;Feet supported Sitting balance-Leahy Scale: Poor Sitting balance - Comments: cues to use RUE to hold on at bedrail, when holding on, able to sit min guard - with no UE support Pt is a mod A to maintain sitting EOB Postural control: Left lateral lean;Posterior lean Standing balance support: Single extremity supported Standing balance-Leahy Scale: Poor Standing balance comment: reliant on support from 2 therapists to remain standing, heavy left lean, blocking on Left knee                             Pertinent Vitals/Pain Pain Assessment: Faces Faces Pain Scale: Hurts a little bit Pain Location: sore in left leg Pain Descriptors / Indicators: Sore Pain Intervention(s): Monitored during session;Repositioned    Home Living Family/patient expects to be  discharged to:: Skilled nursing facility                 Additional Comments: Pt from [facility] prior to current admission    Prior Function Level of Independence: Needs assistance   Gait / Transfers Assistance Needed: unable to  sit to stand alone  ADL's / Homemaking Assistance Needed: assist for all ADL  Comments: history obtained from notes; Prior to CVA in early July he was completely independent (has MBA and works in Engineer, manufacturing)     Higher education careers adviser        Extremity/Trunk Assessment   Upper Extremity Assessment Upper Extremity Assessment: LUE deficits/detail LUE Deficits / Details: Pt grossly 2/5, able to make fist - but unable to hold. Pt reports no difference in sensory, however MD neurology states sensory loss on left LUE Sensation: decreased light touch LUE Coordination: decreased fine motor;decreased gross motor    Lower Extremity Assessment Lower Extremity Assessment: LLE deficits/detail;Difficult to assess due to impaired cognition LLE Deficits / Details: noted weakness LLE grossly 3/5 LLE Sensation: decreased light touch LLE Coordination: decreased fine motor;decreased gross motor    Cervical / Trunk Assessment Cervical / Trunk Assessment: Normal  Communication   Communication: Expressive difficulties  Cognition Arousal/Alertness: Awake/alert Behavior During Therapy: Flat affect Overall Cognitive Status: Impaired/Different from baseline Area of Impairment: Attention;Following commands;Safety/judgement;Awareness                   Current Attention Level: Sustained   Following Commands: Follows one step commands inconsistently;Follows one step commands with increased time Safety/Judgement: Decreased awareness of safety;Decreased awareness of deficits Awareness: Intellectual          General Comments General comments (skin integrity, edema, etc.): Pt with very limited one or two word responses during session. No family present during session    Exercises     Assessment/Plan    PT Assessment Patient needs continued PT services  PT Problem List Decreased strength;Decreased activity tolerance;Decreased balance;Decreased mobility;Decreased safety awareness       PT  Treatment Interventions DME instruction;Gait training;Functional mobility training;Therapeutic activities;Therapeutic exercise;Balance training;Neuromuscular re-education;Cognitive remediation;Patient/family education    PT Goals (Current goals can be found in the Care Plan section)  Acute Rehab PT Goals Patient Stated Goal: none stated PT Goal Formulation: Patient unable to participate in goal setting Time For Goal Achievement: 02/23/17 Potential to Achieve Goals: Fair    Frequency Min 3X/week   Barriers to discharge        Co-evaluation PT/OT/SLP Co-Evaluation/Treatment: Yes Reason for Co-Treatment: Complexity of the patient's impairments (multi-system involvement);Necessary to address cognition/behavior during functional activity;For patient/therapist safety;To address functional/ADL transfers PT goals addressed during session: Mobility/safety with mobility OT goals addressed during session: ADL's and self-care       AM-PAC PT "6 Clicks" Daily Activity  Outcome Measure Difficulty turning over in bed (including adjusting bedclothes, sheets and blankets)?: Total Difficulty moving from lying on back to sitting on the side of the bed? : Total Difficulty sitting down on and standing up from a chair with arms (e.g., wheelchair, bedside commode, etc,.)?: Total Help needed moving to and from a bed to chair (including a wheelchair)?: A Lot Help needed walking in hospital room?: Total Help needed climbing 3-5 steps with a railing? : Total 6 Click Score: 7    End of Session Equipment Utilized During Treatment: Gait belt Activity Tolerance: Patient tolerated treatment well Patient left: in chair;with call bell/phone within reach;with chair alarm set Nurse Communication: Mobility status PT Visit Diagnosis: Apraxia (  R48.2);Hemiplegia and hemiparesis Hemiplegia - Right/Left: Left    Time: [id number] PT Time Calculation (min) (ACUTE ONLY): 23 min   Charges:   PT Evaluation $PT Eval  Moderate Complexity: 1 Procedure     PT G Codes:   PT G-Codes **NOT FOR INPATIENT CLASS** Functional Assessment Tool Used: Clinical judgement Functional Limitation: Mobility: Walking and moving around Mobility: Walking and Moving Around Current Status ([id number]([id number]): At least 80 percent but less than 100 percent impaired, limited or restricted Mobility: Walking and Moving Around Goal Status [phone](G8979): At least 40 percent but less than 60 percent impaired, limited or restricted    Charlotte Crumbevon Querida Beretta, PT DPT  Board Certified Neurologic Specialist [phone]   Fabio AsaDevon J Annalisse Minkoff 02/09/2017, 12:12 PM

## 2017-02-09 NOTE — Progress Notes (Signed)
Patient noted with foley catheter at this time. Per patent, he came from [facility] with foley catheter already in place due to urinary retention.   Sim BoastHavy, RN

## 2017-02-09 NOTE — Clinical Social Work Note (Signed)
Clinical Social Work Assessment  Patient Details  Name: Frank Moss MRN: [id number] Date of Birth: Dec 12, 1970  Date of referral:  02/09/17               Reason for consult:  Facility Placement, Discharge Planning                Permission sought to share information with:  Facility Sport and exercise psychologist, Family Supports Permission granted to share information::  Yes, Verbal Permission Granted  Name::     Adult nurse::  SNF  Relationship::  Spouse  Contact Information:     Housing/Transportation Living arrangements for the past 2 months:  [place], [place] of Information:  Patient, Spouse Patient Interpreter Needed:  None Criminal Activity/Legal Involvement Pertinent to Current Situation/Hospitalization:  No - Comment as needed Significant Relationships:  Adult Children, Spouse Lives with:  Self, Spouse Do you feel safe going back to the place where you live?  Yes Need for family participation in patient care:  No (Coment)  Care giving concerns:  Patient currently lives at home with spouse, and has been at [facility] for SNF since 01/26/17. Patient's spouse has concerns that he wasn't getting enough intense therapy at SNF and would like to see if he would be appropriate for CIR admission.   Social Worker assessment / plan:  CSW met with patient and patient's wife and daughter at bedside. CSW confirmed that patient had been at [facility] for SNF, but the family would possibly like to pursue other options at discharge. Patient's wife discussed how the patient wasn't getting enough intense therapy over at [facility] and would like him to be able to get more therapy, if possible. CSW explained process for admission to CIR and that pursuing SNF as secondary option would be needed in case CIR wasn't appropriate. Patient's wife was given list of facility options and will follow up with choice. CSW will follow to determine placement and facilitate discharge when  appropriate.  Employment status:  Other (Comment) (Unknown) Insurance information:  Managed Care PT Recommendations:  [place] / Referral to community resources:  [place]  Patient/Family's Response to care:  Patient agreeable to return to SNF.  Patient/Family's Understanding of and Emotional Response to Diagnosis, Current Treatment, and Prognosis:  Patient and patient's family are aware of the patient's diagnosis and need for therapy and additional support at this time. Patient and patient's family are hopeful that the patient can recover quickly at SNF and come home soon. Patient and patient's family indicated understanding of CSW role in discharge planning.  Emotional Assessment Appearance:  Appears stated age Attitude/Demeanor/Rapport:    Affect (typically observed):  Appropriate Orientation:  Oriented to Situation, Oriented to  Time, Oriented to Place, Oriented to Self Alcohol / Substance use:  Not Applicable Psych involvement (Current and /or in the community):  No (Comment)  Discharge Needs  Concerns to be addressed:  Care Coordination, Discharge Planning Concerns Readmission within the last 30 days:  No Current discharge risk:  Physical Impairment Barriers to Discharge:  Continued Medical Work up, [place], [place] 02/09/2017, 11:37 AM
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
## 2017-02-09 NOTE — Evaluation (Signed)
Clinical/Bedside Swallow Evaluation Patient Details  Name: Frank Moss MRN: [id number] Date of Birth: 30-May-1971  Today's Date: 02/09/2017 Time: SLP Start Time (ACUTE ONLY): 0850 SLP Stop Time (ACUTE ONLY): 0907 SLP Time Calculation (min) (ACUTE ONLY): 17 min  Past Medical History:  Past Medical History:  Diagnosis Date  . Abnormal ECG   . Acute ischemic right ACA stroke (HCC)   . Coronary artery disease, occlusive   . Dysfunction of eustachian tube   . History of right ACA stroke   . Hyperlipidemia associated with type 2 diabetes mellitus (HCC)   . Hypertension   . Impotence of organic origin   . Lumbago   . Lymphadenitis    Unspecified, except mesenteric  . Type 2 diabetes, uncontrolled, with retinopathy (HCC)   . Urinary retention 01/2017   Past Surgical History: History reviewed. No pertinent surgical history. HPI:  Pt is a 46 y.o. male with PMH of DM, HTN, HLD, CAD, CKD, ED, and acute ischemic right ACA stroke earlier this month for which he is actively receiving SNF rehab at [facility] presenting with acute dysphagia today. Pt was receiving rehab services after hospital [facility]. About a week ago could no longer sit up and needed PT support, fell on Saturday. Eating has been "erratic"- does not like food at SNF. On 7/24 was unable to swallow medications or water. There were concerns about possible UTI or depression. MRI showed subcentimeter acute infarction within R anterior thalamus, subacute R ACA infarction, CXR negative. Failed RN swallow screen due to hx of dysphagia- bedside swallow eval ordered.    Assessment / Plan / Recommendation Clinical Impression  Pt showed no overt s/s of aspiration during this evaluation. Pt's current cognitive status may mildly impact swallow safety and aspiration risk; pt slow to respond to questions and 1-step directions, unaware of current situation, answering most questions in 1-2 word responses. Recommend initiating  regular diet/ thin liquids, meds whole with liquid or crushed in puree if difficulties arise, supervision for meal set-up and assist with feeding as needed. Will f/u x1 for diet tolerance.  SLP Visit Diagnosis: Dysphagia, unspecified (R13.10)    Aspiration Risk  Mild aspiration risk    Diet Recommendation Regular;Thin liquid   Liquid Administration via: Cup;Straw Medication Administration: Other (Comment) (whole w liquid or crushed in puree if difficult) Supervision: Patient able to self feed;Staff to assist with self feeding Compensations: Minimize environmental distractions;Slow rate;Small sips/bites Postural Changes: Seated upright at 90 degrees    Other  Recommendations Oral Care Recommendations: Oral care BID   Follow up Recommendations Skilled Nursing facility      Frequency and Duration min 1 x/week  1 week       Prognosis        Swallow Study   General HPI: Pt is a 46 y.o. male with PMH of DM, HTN, HLD, CAD, CKD, ED, and acute ischemic right ACA stroke earlier this month for which he is actively receiving [facility] presenting with acute dysphagia today. Pt was receiving rehab services after hospital [facility]. About a week ago could no longer sit up and needed PT support, fell on Saturday. Eating has been "erratic"- does not like food at SNF. On 7/24 was unable to swallow medications or water. There were concerns about possible UTI or depression. MRI showed subcentimeter acute infarction within R anterior thalamus, subacute R ACA infarction, CXR negative. Failed RN swallow screen due to hx of dysphagia- bedside swallow eval ordered.  Type of Study: Bedside Swallow Evaluation Previous Swallow Assessment: none in chart Diet Prior to this Study: NPO Temperature Spikes Noted: No Respiratory Status: Room air History of Recent Intubation: No Behavior/Cognition: Alert;Cooperative Oral Cavity Assessment: Within Functional Limits Oral Cavity -  Dentition: Adequate natural dentition Vision: Functional for self-feeding Self-Feeding Abilities:  (assist for set-up) Patient Positioning: Upright in bed Baseline Vocal Quality: Normal Volitional Cough: Weak Volitional Swallow: Able to elicit    Oral/Motor/Sensory Function Overall Oral Motor/Sensory Function: Within functional limits   Ice Chips Ice chips: Within functional limits   Thin Liquid Thin Liquid: Within functional limits Presentation: Cup;Straw    Nectar Thick Nectar Thick Liquid: Not tested   Honey Thick Honey Thick Liquid: Not tested   Puree Puree: Within functional limits   Solid   GO   Solid: Within functional limits Other Comments:  (taking large bites at a time)    Functional Assessment Tool Used: clinical judgment Functional Limitations: Swallowing Swallow Current Status (V2536(G8996): At least 1 percent but less than 20 percent impaired, limited or restricted Swallow Goal Status (U4403(G8997): 0 percent impaired, limited or restricted   Metro KungAmy K Bruno Leach, MA, CCC-SLP 02/09/2017,9:12 AM [id number]

## 2017-02-09 NOTE — Progress Notes (Addendum)
Nutrition Consult/Brief Note  RD consulted for CVA. Pt also identified on the Malnutrition Screening Tool Report.  Wt Readings from Last 15 Encounters:  02/08/17 228 lb 11.2 oz (103.7 kg)  02/08/17 274 lb (124.3 kg)  02/07/17 274 lb (124.3 kg)  01/31/17 274 lb (124.3 kg)  01/28/17 274 lb (124.3 kg)  11/17/07 (!) 274 lb (124.3 kg)  10/09/07 (!) 275 lb (124.7 kg)   Body mass index is 29.36 kg/m. Patient meets criteria for Overweight based on current BMI.   Current diet order is Heart Healthy/Carbohydrate Modified. Reports a good appetite.  Labs and medications reviewed. CBG's [id number]. No muscle or subcutaneous fat depletion noticed.  No nutrition interventions warranted at this time. If nutrition issues arise, please consult RD.   Maureen ChattersKatie Smokey Melott, RD, LDN Pager #: [phone] After-Hours Pager #: [phone]

## 2017-02-10 ENCOUNTER — Other Ambulatory Visit: Payer: Self-pay | Admitting: Nurse Practitioner

## 2017-02-10 DIAGNOSIS — G8194 Hemiplegia, unspecified affecting left nondominant side: Secondary | ICD-10-CM

## 2017-02-10 DIAGNOSIS — I69319 Unspecified symptoms and signs involving cognitive functions following cerebral infarction: Secondary | ICD-10-CM

## 2017-02-10 DIAGNOSIS — I679 Cerebrovascular disease, unspecified: Secondary | ICD-10-CM

## 2017-02-10 DIAGNOSIS — I639 Cerebral infarction, unspecified: Secondary | ICD-10-CM

## 2017-02-10 DIAGNOSIS — G8114 Spastic hemiplegia affecting left nondominant side: Secondary | ICD-10-CM

## 2017-02-10 DIAGNOSIS — R414 Neurologic neglect syndrome: Secondary | ICD-10-CM

## 2017-02-10 DIAGNOSIS — I63521 Cerebral infarction due to unspecified occlusion or stenosis of right anterior cerebral artery: Secondary | ICD-10-CM

## 2017-02-10 DIAGNOSIS — E782 Mixed hyperlipidemia: Secondary | ICD-10-CM

## 2017-02-10 DIAGNOSIS — I69391 Dysphagia following cerebral infarction: Secondary | ICD-10-CM

## 2017-02-10 LAB — CBC
HEMATOCRIT: 34.6 % — AB (ref 39.0–52.0)
Hemoglobin: 11.5 g/dL — ABNORMAL LOW (ref 13.0–17.0)
MCH: 26.4 pg (ref 26.0–34.0)
MCHC: 33.2 g/dL (ref 30.0–36.0)
MCV: 79.4 fL (ref 78.0–100.0)
Platelets: 296 10*3/uL (ref 150–400)
RBC: 4.36 MIL/uL (ref 4.22–5.81)
RDW: 13.7 % (ref 11.5–15.5)
WBC: 5.2 10*3/uL (ref 4.0–10.5)

## 2017-02-10 LAB — BASIC METABOLIC PANEL
Anion gap: 9 (ref 5–15)
BUN: 20 mg/dL (ref 6–20)
CALCIUM: 8.7 mg/dL — AB (ref 8.9–10.3)
CO2: 25 mmol/L (ref 22–32)
CREATININE: 1.6 mg/dL — AB (ref 0.61–1.24)
Chloride: 100 mmol/L — ABNORMAL LOW (ref 101–111)
GFR calc Af Amer: 57 mL/min — ABNORMAL LOW (ref >60)
GFR calc non Af Amer: 49 mL/min — ABNORMAL LOW (ref >60)
GLUCOSE: 98 mg/dL (ref 65–99)
Potassium: 3.8 mmol/L (ref 3.5–5.1)
Sodium: 134 mmol/L — ABNORMAL LOW (ref 135–145)

## 2017-02-10 LAB — GLUCOSE, CAPILLARY
GLUCOSE-CAPILLARY: 145 mg/dL — AB (ref 65–99)
Glucose-Capillary: 102 mg/dL — ABNORMAL HIGH (ref 65–99)
Glucose-Capillary: 161 mg/dL — ABNORMAL HIGH (ref 65–99)
Glucose-Capillary: 176 mg/dL — ABNORMAL HIGH (ref 65–99)

## 2017-02-10 NOTE — Progress Notes (Signed)
Progress Note    Frank Moss  ZOX:[id number] DOB: [birth year]  DOA: 02/08/2017 PCP: Pecola Lawless, MD    Brief Narrative:   Chief complaint: Follow-up stroke  Medical records reviewed and are as summarized below:  Frank Moss is an 46 y.o. male with a PMH of diabetes, hypertension, hyperlipidemia, CAD, chronic kidney disease, and recent ischemic right ACA stroke earlier this month for which he was sent to SNF for rehabilitation on 01/26/17, who returned 02/08/17 with a chief complaint of acute dysphasia. Repeat MRI showed a subcentimeter acute infarction within the right anterior thalamus.  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 Repeat MRI showed a subcentimeter acute infarction in the right anterior thalamus. CT angiogram showed bilateral severe V4 stenosis and multifocal moderate to severe atheromatous stenoses involving the bilateral A2 and P2 segments with the most severe area within the right PCA. Recent 2-D echo done for prior stroke evaluation. No need to repeat. No embolic source identified. Continue aspirin, Plavix. PT/OT/ST consultations. Neurologist recommended TEE/loop recorder placement, but due to concerns given his history of medical noncompliance, a 30 day event monitor was recommended by cardiology. If he is compliant with 30 day monitor, an implantable loop recorder will be considered. TEE re-scheduled for tomorrow due to not being able to reach the patient's wife to obtain consent.Spoke with the patient's wife at bedside who is very insistent about obtaining a formal CIR consultation as she indicates that his premorbid functioning was excellent, that he had an MBA and was gainfully employed, and that his original assessment was done when he was under the  influence of sedating medications. I have requested this.  Active Problems:   DM (diabetes mellitus), type 2 with peripheral vascular complications (HCC) Recent hemoglobin A1c was 11.2%. Currently being managed with 15 units of Lantus and moderate scale SSI. CBGs well-controlled, 93-124.    Mixed hyperlipidemia Well-controlled on Lipitor 80 mg daily with recent LDL 73.    Accelerated hypertension Permissive hypertension in the acute phase. Blood pressure range 170s-190s/69-80s.    CKD (chronic kidney disease), stage III Stable.  HIV screening The patient falls between the ages of 13-64 and should be screened for HIV, therefore HIV testing done 02/08/17: Non- reactive.   Family Communication/Anticipated D/C date and plan/Code Status   DVT prophylaxis: Lovenox ordered. Code Status: Full Code.  Family Communication: Wife updated at bedside. Disposition Plan: SNF 02/11/17 after TEE unless CIR accepts him.   Medical Consultants:    Neurology  Electrophysiology  CIR   Anti-Infectives:    None  Subjective:   Speech improved. Denies choking on food or liquids.  No complaints of pain or nausea.   Objective:    Vitals:   02/09/17 1740 02/09/17 2201 02/10/17 0125 02/10/17 0538  BP: 122/69 (!) 172/72 (!) 184/79 (!) 192/82  Pulse: 63 75 78 84  Resp: 18 18 18 18   Temp: 98.9 F (37.2 C) 99.7 F (37.6 C) 99.5 F (37.5 C) 98.8 F (37.1 C)  TempSrc: Oral Oral Oral Oral  SpO2: 95% 99%  98%  Weight:      Height:        Intake/Output Summary (Last 24 hours) at 02/10/17 0832 Last data filed at 02/10/17 0538  Gross per 24 hour  Intake  720 ml  Output             1375 ml  Net             -655 ml   Filed Weights   02/08/17 1538 02/08/17 2052  Weight: 107.1 kg (236 lb 1.8 oz) 103.7 kg (228 lb 11.2 oz)    Exam: General exam: Well-developed, well-nourished man in no acute distress. Respiratory system: Lungs are diminished but clear  bilaterally. Cardiovascular system: Heart sounds are regular.  Grade II/VI SEM. Gastrointestinal system: Abdomen is soft, nontender, nondistended with normal active bowel sounds. Central nervous system: Dysarthria present. Left hemiparesis.  Able to weakly wiggle fingers move leg in plane but not against gravity.  Extremities: No clubbing, edema, or cyanosis. Skin: No rashes, warm and dry. Psychiatry: Mood and affect flat.  Data Reviewed:   I have personally reviewed following labs and imag  ing studies:  Labs: Labs show the following:   Basic Metabolic Panel:  Recent Labs Lab 02/08/17 1634 02/08/17 1646 02/10/17 0409  NA 136 136 134*  K 4.5 4.5 3.8  CL 100* 97* 100*  CO2 29  --  25  GLUCOSE 176* 174* 98  BUN 20 23* 20  CREATININE 1.59* 1.50* 1.60*  CALCIUM 8.9  --  8.7*   GFR Estimated Creatinine Clearance: 72.5 mL/min (A) (by C-G formula based on SCr of 1.6 mg/dL (H)). Liver Function Tests:  Recent Labs Lab 02/08/17 1634  AST 19  ALT 25  ALKPHOS 118  BILITOT 0.5  PROT 6.3*  ALBUMIN 3.1*   Coagulation profile  Recent Labs Lab 02/08/17 1634  INR 1.01    CBC:  Recent Labs Lab 02/08/17 1634 02/08/17 1646 02/10/17 0409  WBC 7.1  --  5.2  NEUTROABS 4.7  --   --   HGB 12.9* 13.3 11.5*  HCT 36.5* 39.0 34.6*  MCV 79.2  --  79.4  PLT 320  --  296   Cardiac Enzymes:  Recent Labs Lab 02/08/17 2240 02/09/17 0653 02/09/17 1157  TROPONINI <0.03 <0.03 <0.03   CBG:  Recent Labs Lab 02/09/17 0810 02/09/17 1209 02/09/17 1704 02/09/17 2155 02/10/17 0642  GLUCAP 93 95 120* 124* 102*   Lipid Profile:  Recent Labs  02/09/17 0653  CHOL 138  HDL 39*  LDLCALC 73  TRIG 657  CHOLHDL 3.5    Microbiology Recent Results (from the past 240 hour(s))  MRSA PCR Screening     Status: None   Collection Time: 02/08/17  9:55 PM  Result Value Ref Range Status   MRSA by PCR NEGATIVE NEGATIVE Final    Comment:        The GeneXpert MRSA Assay  (FDA approved for NASAL specimens only), is one component of a comprehensive MRSA colonization surveillance program. It is not intended to diagnose MRSA infection nor to guide or monitor treatment for MRSA infections.     Procedures and diagnostic studies:  Ct Angio Head W Or Wo Contrast  Result Date: 02/08/2017 CLINICAL DATA:  Initial evaluation for acute discs are. EXAM: CT ANGIOGRAPHY HEAD AND NECK TECHNIQUE: Multidetector CT imaging of the head and neck was performed using the standard protocol during bolus administration of intravenous contrast. Multiplanar CT image reconstructions and MIPs were obtained to evaluate the vascular anatomy. Carotid stenosis measurements (when applicable) are obtained utilizing NASCET criteria, using the distal internal carotid diameter as the denominator. CONTRAST:  50 cc of Isovue 370. COMPARISON:  Prior CT from earlier same day. FINDINGS: CTA  NECK FINDINGS Aortic arch: Visualized aortic arch of normal caliber with normal 3 vessel morphology. No flow-limiting stenosis about the origin of the great vessels. Minimal plaque noted within the proximal descending intrathoracic aorta. Visualized subclavian artery is widely patent. Right carotid system: Right common and internal carotid artery's widely patent without stenosis, dissection, or occlusion. Left carotid system: Left common and internal carotid artery's widely patent without stenosis, dissection, or occlusion. Vertebral arteries: Both of the vertebral arteries arise from the subclavian arteries. Left vertebral artery slightly dominant. Vertebral arteries widely patent within the neck without stenosis, dissection, or occlusion. Skeleton: No acute osseus abnormality. No worrisome lytic or blastic osseous lesions. Other neck: No acute soft tissue abnormality within the neck. Salivary glands normal. Thyroid normal. No adenopathy. Upper chest: Visualized upper chest within normal limits. Partially visualized lungs  are clear. Review of the MIP images confirms the above findings CTA HEAD FINDINGS Anterior circulation: Petrous, cavernous, and supraclinoid segments of the internal carotid arteries are widely patent without flow-limiting stenosis. ICA termini widely patent. A1 segments patent bilaterally. Anterior communicating artery normal. Multifocal irregularity within the A2 segments bilaterally with moderate to severe stenoses (Series 7, image 75, 72), likely atheromatous. ACA is are patent to their distal aspects. M1 segments patent bilaterally without stenosis or occlusion MCA bifurcations normal. No proximal M2 occlusion. Distal MCA branches demonstrate atheromatous irregularity but are patent and symmetric bilaterally. Posterior circulation: Focal plaque with severe left V4 stenosis (series 8, image 123). Left vertebral artery patent distally to the vertebrobasilar junction. Focal severe stenosis within the diminutive right vertebral artery (series 8, image 123). Right vertebral artery otherwise patent to the vertebrobasilar junction. Posterior inferior cerebral arteries not well evaluated on this exam. Vertebrobasilar junction normal. Basilar artery widely patent to its distal aspect. Superior cerebral arteries patent bilaterally. Posterior cerebral arteries largely supplied via the basilar artery. Short-segment moderate mid left P2 stenosis (series 8, image 123). Left PCA irregular but patent 2 its distal aspects. Right PCA somewhat attenuated and irregular with moderate to severe multifocal stenoses involving the right P2 segment (series 8, image 128). Right PCA also patent to its distal aspect. Small left posterior communicating artery noted. Venous sinuses: Not well evaluated on this exam due to arterial bolus timing. Anatomic variants: None.  No aneurysm or vascular malformation. Delayed phase: No pathologic enhancement. Subacute to chronic right ACA territory infarcts again noted. Additional remote infarcts  involving the right PCA territory. Remote right basal ganglia infarcts. Right maxillary sinus disease. Review of the MIP images confirms the above findings IMPRESSION: 1. Negative CTA for emergent large vessel occlusion. 2. Bilateral severe V4 stenoses as above. 3. Multifocal moderate to severe atheromatous is stenoses involving the bilateral A2 and P2 segments as above, most severe within the right PCA. 4. Negative CTA of the neck without flow-limiting stenosis or significant atheromatous disease. Electronically Signed   By: Rise Mu M.D.   On: 02/08/2017 20:36   Dg Chest 2 View  Result Date: 02/08/2017 CLINICAL DATA:  Recent TIA EXAM: CHEST  2 VIEW COMPARISON:  None. FINDINGS: Cardiac shadow is mildly prominent but accentuated by the frontal technique. No focal infiltrate or sizable effusion is seen. No bony abnormality is noted. IMPRESSION: No active cardiopulmonary disease. Electronically Signed   By: Alcide Clever M.D.   On: 02/08/2017 22:26   Ct Angio Neck W And/or Wo Contrast  Result Date: 02/08/2017 CLINICAL DATA:  Initial evaluation for acute discs are. EXAM: CT ANGIOGRAPHY HEAD AND NECK TECHNIQUE: Multidetector CT  imaging of the head and neck was performed using the standard protocol during bolus administration of intravenous contrast. Multiplanar CT image reconstructions and MIPs were obtained to evaluate the vascular anatomy. Carotid stenosis measurements (when applicable) are obtained utilizing NASCET criteria, using the distal internal carotid diameter as the denominator. CONTRAST:  50 cc of Isovue 370. COMPARISON:  Prior CT from earlier same day. FINDINGS: CTA NECK FINDINGS Aortic arch: Visualized aortic arch of normal caliber with normal 3 vessel morphology. No flow-limiting stenosis about the origin of the great vessels. Minimal plaque noted within the proximal descending intrathoracic aorta. Visualized subclavian artery is widely patent. Right carotid system: Right common and  internal carotid artery's widely patent without stenosis, dissection, or occlusion. Left carotid system: Left common and internal carotid artery's widely patent without stenosis, dissection, or occlusion. Vertebral arteries: Both of the vertebral arteries arise from the subclavian arteries. Left vertebral artery slightly dominant. Vertebral arteries widely patent within the neck without stenosis, dissection, or occlusion. Skeleton: No acute osseus abnormality. No worrisome lytic or blastic osseous lesions. Other neck: No acute soft tissue abnormality within the neck. Salivary glands normal. Thyroid normal. No adenopathy. Upper chest: Visualized upper chest within normal limits. Partially visualized lungs are clear. Review of the MIP images confirms the above findings CTA HEAD FINDINGS Anterior circulation: Petrous, cavernous, and supraclinoid segments of the internal carotid arteries are widely patent without flow-limiting stenosis. ICA termini widely patent. A1 segments patent bilaterally. Anterior communicating artery normal. Multifocal irregularity within the A2 segments bilaterally with moderate to severe stenoses (Series 7, image 75, 72), likely atheromatous. ACA is are patent to their distal aspects. M1 segments patent bilaterally without stenosis or occlusion MCA bifurcations normal. No proximal M2 occlusion. Distal MCA branches demonstrate atheromatous irregularity but are patent and symmetric bilaterally. Posterior circulation: Focal plaque with severe left V4 stenosis (series 8, image 123). Left vertebral artery patent distally to the vertebrobasilar junction. Focal severe stenosis within the diminutive right vertebral artery (series 8, image 123). Right vertebral artery otherwise patent to the vertebrobasilar junction. Posterior inferior cerebral arteries not well evaluated on this exam. Vertebrobasilar junction normal. Basilar artery widely patent to its distal aspect. Superior cerebral arteries patent  bilaterally. Posterior cerebral arteries largely supplied via the basilar artery. Short-segment moderate mid left P2 stenosis (series 8, image 123). Left PCA irregular but patent 2 its distal aspects. Right PCA somewhat attenuated and irregular with moderate to severe multifocal stenoses involving the right P2 segment (series 8, image 128). Right PCA also patent to its distal aspect. Small left posterior communicating artery noted. Venous sinuses: Not well evaluated on this exam due to arterial bolus timing. Anatomic variants: None.  No aneurysm or vascular malformation. Delayed phase: No pathologic enhancement. Subacute to chronic right ACA territory infarcts again noted. Additional remote infarcts involving the right PCA territory. Remote right basal ganglia infarcts. Right maxillary sinus disease. Review of the MIP images confirms the above findings IMPRESSION: 1. Negative CTA for emergent large vessel occlusion. 2. Bilateral severe V4 stenoses as above. 3. Multifocal moderate to severe atheromatous is stenoses involving the bilateral A2 and P2 segments as above, most severe within the right PCA. 4. Negative CTA of the neck without flow-limiting stenosis or significant atheromatous disease. Electronically Signed   By: Rise Mu M.D.   On: 02/08/2017 20:36   Mr Brain Wo Contrast  Result Date: 02/09/2017 CLINICAL DATA:  46 y/o M; acute dysphagia. Right ACA stroke earlier this month. EXAM: MRI HEAD WITHOUT CONTRAST TECHNIQUE: Multiplanar,  multiecho pulse sequences of the brain and surrounding structures were obtained without intravenous contrast. COMPARISON:  02/08/2017 CT angiogram of the head. FINDINGS: Brain: Right ACA distribution diffusion hyperintensity with few areas of mildly reduced diffusion, T2 FLAIR hyperintense signal abnormality, and local mass effect compatible with subacute infarction. Subcentimeter focus of clearly reduced diffusion within the right anterior thalamus compatible with  acute infarction. Background of foci of T2 FLAIR hyperintense signal abnormality in white matter compatible with chronic microvascular ischemic changes, advanced for age and brain parenchymal volume loss. Chronic infarction within the right occipital lobe. No herniation or acute hemorrhage identified. Vascular: Normal flow voids. Skull and upper cervical spine: Normal marrow signal. Sinuses/Orbits: Right frontal, anterior ethmoid, and maxillary sinus opacification, right middle meatus obstructive pattern. No abnormal signal of mastoid air cells. Orbits are unremarkable. Other: None. IMPRESSION: 1. Subcentimeter acute infarction within right anterior thalamus. No acute hemorrhage. 2. Right ACA distribution subacute infarction. Mild local mass effect. No herniation. 3. Background of chronic microvascular ischemic changes and parenchymal volume loss of the brain, advanced for age. 4. Chronic right occipital infarction. 5. Paranasal sinus disease with a right middle meatus obstructive pattern, direct visualization recommended. These results will be called to the ordering clinician or representative by the Radiologist Assistant, and communication documented in the PACS or zVision Dashboard. Electronically Signed   By: Mitzi HansenLance  Furusawa-Stratton M.D.   On: 02/09/2017 06:36   Ct Head Code Stroke W/o Cm  Result Date: 02/08/2017 CLINICAL DATA:  Code stroke. New onset dysphagia. Previous ACA territory infarct. Left hemi paresis. EXAM: CT HEAD WITHOUT CONTRAST TECHNIQUE: Contiguous axial images were obtained from the base of the skull through the vertex without intravenous contrast. COMPARISON:  None. FINDINGS: Brain: A subacute/early chronic right ACA territory infarct is noted. There is some volume loss. No associated hemorrhage is present. A more remote medial and inferior right occipital lobe infarct is present. Ischemic changes within the right internal capsule appear remote as well. No acute cortical infarct,  hemorrhage, or mass lesion is present. The basal ganglia are otherwise intact. The insular ribbon is normal. No other focal cortical lesions are present. The ventricles are of normal size. No significant extra-axial fluid collection is present. Vascular: No hyperdense vessel or unexpected calcification. Skull: The calvarium is intact. No focal lytic or blastic lesions are present. Sinuses/Orbits: The right maxillary sinus is opacified. The ostiomeatal complex is occluded. Anterior right ethmoid and frontal sinus opacification is noted as well. The left paranasal sinuses and bilateral sphenoid sinuses are clear. The mastoid air cells are clear. ASPECTS [facility]([place] Stroke Program Early CT Score) - Ganglionic level infarction (caudate, lentiform nuclei, internal capsule, insula, M1-M3 cortex): 7/7 - Supraganglionic infarction (M4-M6 cortex): 3/3 Total score (0-10 with 10 being normal): 10/10 IMPRESSION: 1. Subacute to early chronic right ACA territory infarcts without hemorrhage. There is some volume loss. 2. More remote infarct in the inferior medial right occipital lobe. 3. Remote infarct of the right internal capsule. 4. No acute infarct. 5. Extensive anterior right-sided sinus disease 6. ASPECTS is 10/10 The These results were called by telephone at the time of interpretation on 02/08/2017 at 4:27 pm to Dr. Wilford CornerARORA, who verbally acknowledged these results. Electronically Signed   By: Marin Robertshristopher  Mattern M.D.   On: 02/08/2017 16:28    Medications:   . aspirin EC  325 mg Oral Daily  . atorvastatin  80 mg Oral QHS  . clopidogrel  75 mg Oral Daily  . enoxaparin (LOVENOX) injection  40 mg Subcutaneous  Q24H  . escitalopram  10 mg Oral Daily  . insulin aspart  0-15 Units Subcutaneous TID WC  . insulin aspart  0-5 Units Subcutaneous QHS  . insulin glargine  15 Units Subcutaneous QHS   Continuous Infusions:  Medical decision making is of high complexity and this patient is at high risk of deterioration, therefore  this is a level 3 visit.  (> 4 problem points, 1 data points, high risk: Need 2 out of 3)   Problems/DDx Points   Self limited or minor (max 2)       1   Established problem, stable       1  4+  Established problem, worsening       2   New problem, no additional W/U planned (max 1)       3   New problem, additional W/U planned        4    Data Reviewed Points   Review/order clinical lab tests       1  1  Review/order x-rays       1   Review/order tests (Echo, EKG, PFTs, etc)       1   Discussion of test results w/ performing MD       1   Independent review of image, tracing or specimen       2   Decision to obtain old records       1   Review and summation of old records       2    Level of risk Presenting prob Diagnostics Management   Minimal 1 self limited/minor Labs CXR EKG/EEG U/A U/S Rest Gargles Bandages Dressings   Low 2 or more self limited/minor 1 stable chronic Acute uncomplicated illness Tests (PFTS) Non-CV imaging Arterial labs Biopsies of skin OTC drugs Minor surgery-no risk PT OT IVF without additives    Moderate 1 or more chronic illnesses w/ mild exac, progression or S/E from tx 2 or more stable chronic illnesses Undiagnosed new problem w/ uncertain prognosis Acute complicated injury  Stress tests Endoscopies with no risk factors Deep needle or incisional bx CV imaging without risk LP Thoracentesis Paracentesis Minor surgery w/ risks Elective major surgery w/ no risk (open, percutaneous or endoscopic) Prescription drugs Therapeutic nucl med IVF with additives Closed tx of fracture/dislocation    High Severe exac of chronic illness Acute or chronic illness/injury may pose a threat to life or bodily function (ARF) Change in neuro status    CV imaging w/ contrast and risk Cardio electophysiologic tests Endoscopies w/ risk Discography Elective major surgery Emergency major surgery Parenteral controlled substances Drug therapy req  monitoring for toxicity DNR/de-escalation of care    MDM Prob points Data points Risk   Straightforward    <1    <1    Min   Low complexity    2    2    Low   Moderate    3    3    Mod   High Complexity    4 or more    4 or more    High      LOS: 1 day   RAMA,CHRISTINA  [facility] Pager [phone]. If unable to reach me by pager, please call my cell phone at [phone].  *Please refer to [email or web address], password [email or web address] to get updated schedule on who will round on this patient, as hospitalists switch teams weekly. If 7PM-7AM, please contact night-coverage at [email or web address], password [email or web address] for  any overnight needs.  02/10/2017, 8:32 AM

## 2017-02-10 NOTE — Progress Notes (Signed)
[facility] discontinued per attending MD order. Patient is due to void.  Sim BoastHavy, RN

## 2017-02-10 NOTE — Progress Notes (Signed)
    [facility] has been requested to perform a transesophageal echocardiogram on Frank Moss for CVA.  After careful review of history and examination, the risks and benefits of transesophageal echocardiogram have been explained including risks of esophageal damage, perforation (1:10,000 risk), bleeding, pharyngeal hematoma as well as other potential complications associated with conscious sedation including aspiration, arrhythmia, respiratory failure and death. Alternatives to treatment were discussed, questions were answered. Patient is willing to proceed. Case is scheduled tomorrow (02/11/17) at 1300 with Dr. Mendel Corninguner. NPO after midnight.  Frank CrockKathryn Zaccai Chavarin, PA-C  02/10/2017 1:27 PM

## 2017-02-10 NOTE — Progress Notes (Addendum)
  Speech Language Pathology Treatment: Dysphagia  Patient Details Name: Frank Moss MRN: [id number] DOB: [birth year] Today's Date: 02/10/2017 Time: [id number] SLP Time Calculation (min) (ACUTE ONLY): 15 min  Assessment / Plan / Recommendation Clinical Impression  Dysphagia treatment provided for diet tolerance check. Pt tolerated regular diet/ thin liquids without overt s/s of aspiration. RN reported pt has been doing well during meals, only some trouble with meds. Wife at bedside endorsed this and reported that at baseline pt has trouble with large pills. Pt continues to have cognitive deficits such as slowed processing and decreased alternating attention; however overall aspiration risk appears low. Recommend continuing regular diet/ thin liquids, crush large meds, assist with meal setup. No f/u needed for dysphagia; recommend follow-up with cognitive-linguistic evaluation- please order.   HPI HPI: Pt is a 46 y.o. male with PMH of DM, HTN, HLD, CAD, CKD, ED, and acute ischemic right ACA stroke earlier this month for which he is actively receiving SNF rehab at [facility] presenting with acute dysphagia today. Pt was receiving rehab services after [facility]. About a week ago could no longer sit up and needed PT support, fell on Saturday. Eating has been "erratic"- does not like food at SNF. On 7/24 was unable to swallow medications or water. There were concerns about possible UTI or depression. MRI showed subcentimeter acute infarction within R anterior thalamus, subacute R ACA infarction, CXR negative. Failed RN swallow screen due to hx of dysphagia- bedside swallow eval ordered.       SLP Plan          Recommendations  Diet recommendations: Regular;Thin liquid Liquids provided via: Cup;Straw Medication Administration:  (meds crushed for large pills) Supervision: Patient able to self feed;Intermittent supervision to cue for compensatory strategies Compensations:  Slow rate;Small sips/bites Postural Changes and/or Swallow Maneuvers: Seated upright 90 degrees                Oral Care Recommendations: Oral care BID Follow up Recommendations:  (Wife requesting CIR- from SLP perspective either CIR or SNF ) SLP Visit Diagnosis: Dysphagia, unspecified (R13.10)       GO                Metro KungAmy K Oleksiak, MA, CCC-SLP 02/10/2017, 1:59 PM [id number]

## 2017-02-10 NOTE — Progress Notes (Signed)
STROKE TEAM PROGRESS NOTE   HPI: Frank Moss is a 46 y.o. 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 He was last seen normal sometime during this morning when he had his breakfast and was able to eat chew and swallow without problems.  He was seen at [facility] on 01/20/2017 for acute onset of this right ACA stroke. I was able to access the reports of the MRIs via care everywhere that showed right ACA infarct probably because of severe stenosis of the right ACA and diffuse intracerebral atherosclerosis. He was started on dual antibiotic therapy and high-dose statin along with medications to control his blood pressures and discharged to rehabilitation. According to his wife, he had been doing well in terms of participating with therapy in the rehabilitation facility up until this morning when this change happened. There was no report of seizure-like activity or loss of consciousness. There was no report of any worsening weakness. No report of preceding fevers chills or illnesses.   Patient was unable to provide any history reliably. History was obtained from his wife.  LKW: Before noon today Premorbid modified Rankin scale (mRS): 5  Patient was not administered IV t-PA secondary to recent stroke. He was admitted to General Neurology for further evaluation and treatment.   SUBJECTIVE (INTERVAL HISTORY) His family is not at bedside.  The patient is awake, alert, and follows all commands appropriately.  The patient is able to verbalize and speaks short sentences with delay.   CT angio shoes diminished branches in distal PCA corresponding to his stroke. Which may have been  embolic but stroke w/u aft [facility] did not include TEE or prolonged cardiac monitoring. Dr. Johney Frame feels patient  Is not a good   candidate for loop recorder and recommends 30 day heart monitor instead OBJECTIVE Temp:  [98.8 F (37.1 C)-99.7 F (37.6 C)] 98.9 F (37.2 C) (07/26 0940) Pulse Rate:  [63-90] 90 (07/26 0940) Cardiac Rhythm: Normal sinus rhythm (07/26 0700) Resp:  [18-20] 20 (07/26 0940) BP: (122-192)/(69-84) 189/84 (07/26 0940) SpO2:  [95 %-99 %] 97 % (07/26 0940)  CBC:   Recent Labs Lab 02/08/17 1634 02/08/17 1646 02/10/17 0409  WBC 7.1  --  5.2  NEUTROABS 4.7  --   --   HGB 12.9* 13.3 11.5*  HCT 36.5* 39.0 34.6*  MCV 79.2  --  79.4  PLT 320  --  296    Basic Metabolic Panel:   Recent Labs Lab 02/08/17 1634 02/08/17 1646 02/10/17 0409  NA 136 136 134*  K 4.5 4.5 3.8  CL 100* 97* 100*  CO2 29  --  25  GLUCOSE 176* 174* 98  BUN 20 23* 20  CREATININE 1.59* 1.50* 1.60*  CALCIUM 8.9  --  8.7*    Lipid Panel:     Component Value Date/Time   CHOL 138 02/09/2017 0653   TRIG 128 02/09/2017 0653   HDL 39 (L) 02/09/2017 0653   CHOLHDL 3.5 02/09/2017 0653   VLDL 26 02/09/2017 0653   LDLCALC 73 02/09/2017 0653   HgbA1c:  Lab Results  Component Value Date   HGBA1C 11.2 01/21/2017   Urine Drug Screen: No results found for: LABOPIA, COCAINSCRNUR, LABBENZ, AMPHETMU, THCU, LABBARB  Alcohol Level  No results found for: ETH  IMAGING  Ct Angio Head W Or Wo Contrast Ct Angio Neck W And/or Wo Contrast 02/08/2017 IMPRESSION: 1. Negative CTA for emergent large vessel occlusion. 2. Bilateral severe V4 stenoses as above. 3. Multifocal moderate to severe atheromatous is stenoses involving the bilateral A2 and P2 segments as above, most severe within the right PCA. 4. Negative CTA of the neck without flow-limiting stenosis or significant atheromatous disease.    Dg Chest 2 View 02/08/2017 IMPRESSION: No active cardiopulmonary disease.  Mr Brain Wo  Contrast 02/09/2017 IMPRESSION: 1. Subcentimeter acute infarction within right anterior thalamus. No acute hemorrhage. 2. Right ACA distribution subacute infarction. Mild local mass effect. No herniation. 3. Background of chronic microvascular ischemic changes and parenchymal volume loss of the brain, advanced for age. 4. Chronic right occipital infarction. 5. Paranasal sinus disease with a right middle meatus obstructive pattern, direct visualization recommended.  Ct Head Code Stroke W/o Cm 02/08/2017 IMPRESSION: 1. Subacute to early chronic right ACA territory infarcts without hemorrhage. There is some volume loss. 2. More remote infarct in the inferior medial right occipital lobe. 3. Remote infarct of the right internal capsule. 4. No acute infarct. 5. Extensive anterior right-sided sinus disease 6. ASPECTS is 10/10      PHYSICAL EXAM Pleasant middle-aged African-American male currently not in distress. . Afebrile. Head is nontraumatic. Neck is supple without bruit.    Cardiac exam no murmur or gallop. Lungs are clear to auscultation. Distal pulses are well felt. Neurological Exam :  Awake alert oriented to race and person. Patient is speaking better today and following commands. Marland Kitchen. Speech is dysarthric. Able to follow simple midline and one-step commands. Extraocular movements are full range without nystagmus. Right lower facial weakness. Tongue midline. Left hemiplegia with 2/5 left upper extremity proximally in 1/5 distally. Left lower extremity grade 3-4/5  . Tone is diminished on left side. Sensation appears diminished in the left compared to the right. Deep tendon reflexes are symmetric. Plantars are downgoing. ASSESSMENT/PLAN Frank Moss is a 46 y.o. 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 He did not receive IV t-PA due to recent stroke.   Stroke: Subcentimeter acute infarction within right anterior thalamus, likely embolic in the setting of multiple prior infarcts in multiple distributions in the setting of severe A2 and P2 segment atheromatous stenosis, advanced small vessel disease, and poorly controlled diabetes.  Resultant  Dysarthria and left hemiparesis  CT head:  No acute infarct  MRI head: Subcentimeter acute infarction within right anterior thalamus and subacute right anterior cerebral artery distribution infarct. Chronic right occipital infarct CTangio head   Multifocal moderate to severe atheromatous is stenoses involving the bilateral A2 and P2 segments as above, most severe within the  right PCA. CT angio neck : no flow-limiting stenosis or  significant atheromatous disease.  2D Echo   not ordered  LDL 73  HgbA1c 11.2  SCDs for VTE prophylaxis Diet heart healthy/carb modified Room service appropriate? Yes; Fluid consistency: Thin Diet NPO time specified  aspirin 325 mg daily and clopidogrel 75 mg daily prior to admission, now on aspirin 325 mg daily and clopidogrel 75 mg daily  Patient counseled to be compliant with his antithrombotic medications  Ongoing aggressive stroke risk  factor management  Therapy recommendations: SNF;Supervision/Assistance - 24 hour    Disposition:   pending  Hypertension  Stable  Permissive hypertension (OK if < 220/120) but gradually normalize in 5-7 days  Long-term BP goal normotensive  Hyperlipidemia  Home meds:  Atorvastatin 80mg  PO, resumed in hospital  LDL 73, goal < 70  Continue statin at discharge  Diabetes  HgbA1c 11.2, goal < 7.0  Uncontrolled  Other Stroke Risk Factors  Borderline obesity, Body mass index is 29.36 kg/m., recommend weight loss, diet and exercise as appropriate   Hx  stroke/TIA  Other Active Problems  None  Hospital day # 1  I have personally examined this patient, reviewed notes, independently viewed imaging studies, participated in medical decision making and plan of care.ROS completed by me personally and pertinent positives fully documented  I have made any additions or clarifications directly to the above note.  He presented with increasing swelling difficulties yesterday without significant worsening of his left hemiparesis as per his wife. I have reviewed his electronic medical records for [facility]. He was found to have a right ACA stroke with atherosclerotic changes described in the A2 segment of the anterior cerebral artery and posterior cerebral artery in the P2 segments. However there is no significant atherosclerosis noted in the aortic arch and carotid bifurcations are ICA terminus. Patient did not get a transesophageal echocardiogram or prolonged cardiac monitoring for A. fib which I think is necessary. Cardiology prefers 30 day external heart monitor over loop recorder. I had a long discussion with the patient and Dr Darnelle Catalanama at the bedside and answered questions. Continue aspirin and Plavix for stroke prevention for now.  . Continue therapy consults. Discussed with Dr. Darnelle Catalanama and Allred. Greater than 50% time during this 25 minute visit was spent on counseling and coordination of care about his stroke, discussion about prevention and treatment in answering questions  Delia HeadyPramod Sethi, MD Medical Director [facility] Pager: [phone] 02/10/2017 12:36 PM   To contact Stroke Continuity provider, please refer to [email or web address]. After hours, contact General Neurology

## 2017-02-10 NOTE — Consult Note (Signed)
ELECTROPHYSIOLOGY CONSULT NOTE  Patient ID: Frank Moss MRN: [id number], DOB/AGE: 1971/03/26   Admit date: 02/08/2017 Date of Consult: 02/10/2017  Primary Physician: Pecola LawlessHopper, William F, MD Primary Cardiologist: Katheran JamesYelton ([facility]) Reason for Consultation: Cryptogenic stroke; recommendations regarding Implantable Loop Recorder  History of Present Illness Frank Moss is a 46 y.o. male whom EP has been asked to see by Dr Pearlean BrownieSethi for evaluation of ILR implant in the setting of cryptogenic stroke. He was admitted on 02/08/2017 with trouble swallowing while at his rehab facility. [facility] was [facility] and was recovering at rehab facility since.   Imaging demonstrated subcentimenter acute infarct within right anterior thalamus, felt to be embolic 2/2 unknown souce.  He has undergone workup for stroke including echocardiogram and carotid dopplers.  The patient has been monitored on telemetry which has demonstrated sinus rhythm with no arrhythmias.  Inpatient stroke work-up is to be completed with a TEE.  He has had very elevated blood pressure and blood sugars chronically.   Echocardiogram 01/2017 from [facility] demonstrated EF 55-60%, LAV 23.6.   Lab work is reviewed.  Prior to admission, the patient denies chest pain, shortness of breath, dizziness, palpitations, or syncope.  They are recovering from their stroke with plans to return to SNF at discharge.  EP has been asked to evaluate for placement of an implantable loop recorder to monitor for atrial fibrillation.  Past Medical History:  Diagnosis Date  . Abnormal ECG   . Acute ischemic right ACA stroke (HCC)   . Coronary artery disease, occlusive   . Dysfunction of eustachian tube   . History of right ACA stroke   . Hyperlipidemia associated with type 2 diabetes mellitus (HCC)   . Hypertension   . Impotence of organic origin   . Lumbago   . Lymphadenitis    Unspecified, except mesenteric  . Type 2 diabetes,  uncontrolled, with retinopathy (HCC)   . Urinary retention 01/2017     Surgical History: History reviewed. No pertinent surgical history.   Prescriptions Prior to Admission  Medication Sig Dispense Refill Last Dose  . aspirin 325 MG tablet Take 325 mg by mouth daily.   02/08/2017 at 0800  . atorvastatin (LIPITOR) 80 MG tablet Take 80 mg by mouth at bedtime.    02/07/2017 at 2000  . bisacodyl (DULCOLAX) 10 MG suppository Place 10 mg rectally once as needed (FOR CONSTIPATION IF NO RELIEF FROM MILK OF MAGNESIA).   PRN at PRN  . cloNIDine (CATAPRES) 0.2 MG tablet Take 0.2 mg by mouth 3 (three) times daily.   02/08/2017 at 1400  . clopidogrel (PLAVIX) 75 MG tablet Take 75 mg by mouth daily.   02/08/2017 at 0800  . diltiazem (CARDIZEM) 90 MG tablet Take 90 mg by mouth every 6 (six) hours.   02/08/2017 at 1200  . escitalopram (LEXAPRO) 10 MG tablet Take 10 mg by mouth daily.   02/08/2017 at 0800  . insulin glargine (LANTUS) 100 UNIT/ML injection Inject 15 Units into the skin at bedtime.   02/07/2017 at 2000  . insulin lispro (HUMALOG) 100 UNIT/ML injection Inject 5 Units into the skin See admin instructions. THREE TIMES A DAY BEFORE EACH MEAL IF BGL >180 MG/DL   4/09/81197/24/2018 at [id number]  . magnesium hydroxide (MILK OF MAGNESIA) 400 MG/5ML suspension Take 30 mLs by mouth once as needed (FOR CONSTIPATION).   PRN at PRN  . metFORMIN (GLUCOPHAGE) 1000 MG tablet Take 1,000 mg by mouth daily.   02/08/2017 at  0800  . nitroGLYCERIN (NITROSTAT) 0.4 MG SL tablet Place 0.4 mg under the tongue every 5 (five) minutes x 3 doses as needed for chest pain.    PRN at PRN  . ondansetron (ZOFRAN) 4 MG tablet Take 4 mg by mouth every 4 (four) hours as needed for nausea or vomiting.   PRN at PRN  . Sodium Phosphates (RA SALINE ENEMA) 19-7 GM/118ML ENEM Place 1 enema rectally once as needed (FOR CONSTIPATION NOT RELIEVED BY DULCOLAX SUPPOSITORY AND CALL MD IF NO RELIEF FROM ENEMA).   PRN at PRN    Inpatient Medications:  . aspirin EC   325 mg Oral Daily  . atorvastatin  80 mg Oral QHS  . clopidogrel  75 mg Oral Daily  . enoxaparin (LOVENOX) injection  40 mg Subcutaneous Q24H  . escitalopram  10 mg Oral Daily  . insulin aspart  0-15 Units Subcutaneous TID WC  . insulin aspart  0-5 Units Subcutaneous QHS  . insulin glargine  15 Units Subcutaneous QHS    Allergies:  Allergies  Allergen Reactions  . Beta Adrenergic Blockers Other (See Comments)    "Fatigue," but this isn't noted on the patient's [facility]    Social History   Social History  . Marital status: Married    Spouse name: N/A  . Number of children: N/A  . Years of education: N/A   Occupational History  . Not on file.   Social History Main Topics  . Smoking status: Never Smoker  . Smokeless tobacco: Never Used  . Alcohol use No  . Drug use: No  . Sexual activity: Not on file   Other Topics Concern  . Not on file   Social History Narrative   Has a BS in business administration and an MBA.  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     Family History  Problem Relation Age of Onset  . Hypertension Mother   . Depression Mother   . Cancer Neg Hx   . Diabetes Neg Hx   . Stroke Neg Hx       Review of Systems: All other systems reviewed and are otherwise negative except as noted above.  Physical Exam: Vitals:   02/09/17 1740 02/09/17 2201 02/10/17 0125 02/10/17 0538  BP: 122/69 (!) 172/72 (!) 184/79 (!) 192/82  Pulse: 63 75 78 84  Resp: 18 18 18 18   Temp: 98.9 F (37.2 C) 99.7 F (37.6 C) 99.5 F (37.5 C) 98.8 F (37.1 C)  TempSrc: Oral Oral Oral Oral  SpO2: 95% 99%  98%  Weight:      Height:        GEN- The patient is ill appearing, alert but not very conversant Head- normocephalic, atraumatic Eyes-  Sclera clear, conjunctiva pink Ears- hearing intact Oropharynx- clear Neck- supple Lungs- Clear to ausculation bilaterally, normal work of breathing Heart- Regular rate and rhythm, no murmurs, rubs or gallops  GI-  soft, NT, ND, + BS Extremities- no clubbing, cyanosis, or edema MS- no significant deformity or atrophy Skin- no rash or lesion Psych- very flat affect   Labs:   Lab Results  Component Value Date   WBC 5.2 02/10/2017   HGB 11.5 (L) 02/10/2017   HCT 34.6 (L) 02/10/2017   MCV 79.4 02/10/2017   PLT 296 02/10/2017    Recent Labs Lab 02/08/17 1634  02/10/17 0409  NA 136  < > 134*  K 4.5  < > 3.8  CL 100*  < > 100*  CO2 29  --  25  BUN 20  < > 20  CREATININE 1.59*  < > 1.60*  CALCIUM 8.9  --  8.7*  PROT 6.3*  --   --   BILITOT 0.5  --   --   ALKPHOS 118  --   --   ALT 25  --   --   AST 19  --   --   GLUCOSE 176*  < > 98  < > = values in this interval not displayed. Lab Results  Component Value Date   TROPONINI <0.03 02/09/2017     Radiology/Studies: Ct Angio Head W Or Wo Contrast  Result Date: 02/08/2017 CLINICAL DATA:  Initial evaluation for acute discs are. EXAM: CT ANGIOGRAPHY HEAD AND NECK TECHNIQUE: Multidetector CT imaging of the head and neck was performed using the standard protocol during bolus administration of intravenous contrast. Multiplanar CT image reconstructions and MIPs were obtained to evaluate the vascular anatomy. Carotid stenosis measurements (when applicable) are obtained utilizing NASCET criteria, using the distal internal carotid diameter as the denominator. CONTRAST:  50 cc of Isovue 370. COMPARISON:  Prior CT from earlier same day. FINDINGS: CTA NECK FINDINGS Aortic arch: Visualized aortic arch of normal caliber with normal 3 vessel morphology. No flow-limiting stenosis about the origin of the great vessels. Minimal plaque noted within the proximal descending intrathoracic aorta. Visualized subclavian artery is widely patent. Right carotid system: Right common and internal carotid artery's widely patent without stenosis, dissection, or occlusion. Left carotid system: Left common and internal carotid artery's widely patent without stenosis, dissection, or  occlusion. Vertebral arteries: Both of the vertebral arteries arise from the subclavian arteries. Left vertebral artery slightly dominant. Vertebral arteries widely patent within the neck without stenosis, dissection, or occlusion. Skeleton: No acute osseus abnormality. No worrisome lytic or blastic osseous lesions. Other neck: No acute soft tissue abnormality within the neck. Salivary glands normal. Thyroid normal. No adenopathy. Upper chest: Visualized upper chest within normal limits. Partially visualized lungs are clear. Review of the MIP images confirms the above findings CTA HEAD FINDINGS Anterior circulation: Petrous, cavernous, and supraclinoid segments of the internal carotid arteries are widely patent without flow-limiting stenosis. ICA termini widely patent. A1 segments patent bilaterally. Anterior communicating artery normal. Multifocal irregularity within the A2 segments bilaterally with moderate to severe stenoses (Series 7, image 75, 72), likely atheromatous. ACA is are patent to their distal aspects. M1 segments patent bilaterally without stenosis or occlusion MCA bifurcations normal. No proximal M2 occlusion. Distal MCA branches demonstrate atheromatous irregularity but are patent and symmetric bilaterally. Posterior circulation: Focal plaque with severe left V4 stenosis (series 8, image 123). Left vertebral artery patent distally to the vertebrobasilar junction. Focal severe stenosis within the diminutive right vertebral artery (series 8, image 123). Right vertebral artery otherwise patent to the vertebrobasilar junction. Posterior inferior cerebral arteries not well evaluated on this exam. Vertebrobasilar junction normal. Basilar artery widely patent to its distal aspect. Superior cerebral arteries patent bilaterally. Posterior cerebral arteries largely supplied via the basilar artery. Short-segment moderate mid left P2 stenosis (series 8, image 123). Left PCA irregular but patent 2 its distal  aspects. Right PCA somewhat attenuated and irregular with moderate to severe multifocal stenoses involving the right P2 segment (series 8, image 128). Right PCA also patent to its distal aspect. Small left posterior communicating artery noted. Venous sinuses: Not well evaluated on this exam due to arterial bolus timing. Anatomic variants: None.  No aneurysm or vascular malformation. Delayed phase: No  pathologic enhancement. Subacute to chronic right ACA territory infarcts again noted. Additional remote infarcts involving the right PCA territory. Remote right basal ganglia infarcts. Right maxillary sinus disease. Review of the MIP images confirms the above findings IMPRESSION: 1. Negative CTA for emergent large vessel occlusion. 2. Bilateral severe V4 stenoses as above. 3. Multifocal moderate to severe atheromatous is stenoses involving the bilateral A2 and P2 segments as above, most severe within the right PCA. 4. Negative CTA of the neck without flow-limiting stenosis or significant atheromatous disease. Electronically Signed   By: Rise Mu M.D.   On: 02/08/2017 20:36   Dg Chest 2 View  Result Date: 02/08/2017 CLINICAL DATA:  Recent TIA EXAM: CHEST  2 VIEW COMPARISON:  None. FINDINGS: Cardiac shadow is mildly prominent but accentuated by the frontal technique. No focal infiltrate or sizable effusion is seen. No bony abnormality is noted. IMPRESSION: No active cardiopulmonary disease. Electronically Signed   By: Alcide Clever M.D.   On: 02/08/2017 22:26   Ct Angio Neck W And/or Wo Contrast  Result Date: 02/08/2017 CLINICAL DATA:  Initial evaluation for acute discs are. EXAM: CT ANGIOGRAPHY HEAD AND NECK TECHNIQUE: Multidetector CT imaging of the head and neck was performed using the standard protocol during bolus administration of intravenous contrast. Multiplanar CT image reconstructions and MIPs were obtained to evaluate the vascular anatomy. Carotid stenosis measurements (when applicable) are  obtained utilizing NASCET criteria, using the distal internal carotid diameter as the denominator. CONTRAST:  50 cc of Isovue 370. COMPARISON:  Prior CT from earlier same day. FINDINGS: CTA NECK FINDINGS Aortic arch: Visualized aortic arch of normal caliber with normal 3 vessel morphology. No flow-limiting stenosis about the origin of the great vessels. Minimal plaque noted within the proximal descending intrathoracic aorta. Visualized subclavian artery is widely patent. Right carotid system: Right common and internal carotid artery's widely patent without stenosis, dissection, or occlusion. Left carotid system: Left common and internal carotid artery's widely patent without stenosis, dissection, or occlusion. Vertebral arteries: Both of the vertebral arteries arise from the subclavian arteries. Left vertebral artery slightly dominant. Vertebral arteries widely patent within the neck without stenosis, dissection, or occlusion. Skeleton: No acute osseus abnormality. No worrisome lytic or blastic osseous lesions. Other neck: No acute soft tissue abnormality within the neck. Salivary glands normal. Thyroid normal. No adenopathy. Upper chest: Visualized upper chest within normal limits. Partially visualized lungs are clear. Review of the MIP images confirms the above findings CTA HEAD FINDINGS Anterior circulation: Petrous, cavernous, and supraclinoid segments of the internal carotid arteries are widely patent without flow-limiting stenosis. ICA termini widely patent. A1 segments patent bilaterally. Anterior communicating artery normal. Multifocal irregularity within the A2 segments bilaterally with moderate to severe stenoses (Series 7, image 75, 72), likely atheromatous. ACA is are patent to their distal aspects. M1 segments patent bilaterally without stenosis or occlusion MCA bifurcations normal. No proximal M2 occlusion. Distal MCA branches demonstrate atheromatous irregularity but are patent and symmetric  bilaterally. Posterior circulation: Focal plaque with severe left V4 stenosis (series 8, image 123). Left vertebral artery patent distally to the vertebrobasilar junction. Focal severe stenosis within the diminutive right vertebral artery (series 8, image 123). Right vertebral artery otherwise patent to the vertebrobasilar junction. Posterior inferior cerebral arteries not well evaluated on this exam. Vertebrobasilar junction normal. Basilar artery widely patent to its distal aspect. Superior cerebral arteries patent bilaterally. Posterior cerebral arteries largely supplied via the basilar artery. Short-segment moderate mid left P2 stenosis (series 8, image 123). Left PCA  irregular but patent 2 its distal aspects. Right PCA somewhat attenuated and irregular with moderate to severe multifocal stenoses involving the right P2 segment (series 8, image 128). Right PCA also patent to its distal aspect. Small left posterior communicating artery noted. Venous sinuses: Not well evaluated on this exam due to arterial bolus timing. Anatomic variants: None.  No aneurysm or vascular malformation. Delayed phase: No pathologic enhancement. Subacute to chronic right ACA territory infarcts again noted. Additional remote infarcts involving the right PCA territory. Remote right basal ganglia infarcts. Right maxillary sinus disease. Review of the MIP images confirms the above findings IMPRESSION: 1. Negative CTA for emergent large vessel occlusion. 2. Bilateral severe V4 stenoses as above. 3. Multifocal moderate to severe atheromatous is stenoses involving the bilateral A2 and P2 segments as above, most severe within the right PCA. 4. Negative CTA of the neck without flow-limiting stenosis or significant atheromatous disease. Electronically Signed   By: Rise Mu M.D.   On: 02/08/2017 20:36   Mr Brain Wo Contrast  Result Date: 02/09/2017 CLINICAL DATA:  46 y/o M; acute dysphagia. Right ACA stroke earlier this month.  EXAM: MRI HEAD WITHOUT CONTRAST TECHNIQUE: Multiplanar, multiecho pulse sequences of the brain and surrounding structures were obtained without intravenous contrast. COMPARISON:  02/08/2017 CT angiogram of the head. FINDINGS: Brain: Right ACA distribution diffusion hyperintensity with few areas of mildly reduced diffusion, T2 FLAIR hyperintense signal abnormality, and local mass effect compatible with subacute infarction. Subcentimeter focus of clearly reduced diffusion within the right anterior thalamus compatible with acute infarction. Background of foci of T2 FLAIR hyperintense signal abnormality in white matter compatible with chronic microvascular ischemic changes, advanced for age and brain parenchymal volume loss. Chronic infarction within the right occipital lobe. No herniation or acute hemorrhage identified. Vascular: Normal flow voids. Skull and upper cervical spine: Normal marrow signal. Sinuses/Orbits: Right frontal, anterior ethmoid, and maxillary sinus opacification, right middle meatus obstructive pattern. No abnormal signal of mastoid air cells. Orbits are unremarkable. Other: None. IMPRESSION: 1. Subcentimeter acute infarction within right anterior thalamus. No acute hemorrhage. 2. Right ACA distribution subacute infarction. Mild local mass effect. No herniation. 3. Background of chronic microvascular ischemic changes and parenchymal volume loss of the brain, advanced for age. 4. Chronic right occipital infarction. 5. Paranasal sinus disease with a right middle meatus obstructive pattern, direct visualization recommended. These results will be called to the ordering clinician or representative by the Radiologist Assistant, and communication documented in the PACS or zVision Dashboard. Electronically Signed   By: Mitzi Hansen M.D.   On: 02/09/2017 06:36   Ct Head Code Stroke W/o Cm  Result Date: 02/08/2017 CLINICAL DATA:  Code stroke. New onset dysphagia. Previous ACA territory  infarct. Left hemi paresis. EXAM: CT HEAD WITHOUT CONTRAST TECHNIQUE: Contiguous axial images were obtained from the base of the skull through the vertex without intravenous contrast. COMPARISON:  None. FINDINGS: Brain: A subacute/early chronic right ACA territory infarct is noted. There is some volume loss. No associated hemorrhage is present. A more remote medial and inferior right occipital lobe infarct is present. Ischemic changes within the right internal capsule appear remote as well. No acute cortical infarct, hemorrhage, or mass lesion is present. The basal ganglia are otherwise intact. The insular ribbon is normal. No other focal cortical lesions are present. The ventricles are of normal size. No significant extra-axial fluid collection is present. Vascular: No hyperdense vessel or unexpected calcification. Skull: The calvarium is intact. No focal lytic or blastic lesions are present.  Sinuses/Orbits: The right maxillary sinus is opacified. The ostiomeatal complex is occluded. Anterior right ethmoid and frontal sinus opacification is noted as well. The left paranasal sinuses and bilateral sphenoid sinuses are clear. The mastoid air cells are clear. ASPECTS [facility]([place] Stroke Program Early CT Score) - Ganglionic level infarction (caudate, lentiform nuclei, internal capsule, insula, M1-M3 cortex): 7/7 - Supraganglionic infarction (M4-M6 cortex): 3/3 Total score (0-10 with 10 being normal): 10/10 IMPRESSION: 1. Subacute to early chronic right ACA territory infarcts without hemorrhage. There is some volume loss. 2. More remote infarct in the inferior medial right occipital lobe. 3. Remote infarct of the right internal capsule. 4. No acute infarct. 5. Extensive anterior right-sided sinus disease 6. ASPECTS is 10/10 The These results were called by telephone at the time of interpretation on 02/08/2017 at 4:27 pm to Dr. Wilford CornerARORA, who verbally acknowledged these results. Electronically Signed   By: Marin Robertshristopher  Mattern  M.D.   On: 02/08/2017 16:28    12-lead ECG sinus rhythm All prior EKG's in EPIC reviewed with no documented atrial fibrillation  Telemetry sinus rhythm  Assessment and Plan:  1. Cryptogenic stroke The patient presents with cryptogenic stroke.  I spoke at length with the patient about monitoring for afib with either a 30 day event monitor or an implantable loop recorder.  Given current state post stroke, I worry that he may not be a good candidate for remote monitoring.  I also worry about long term compliance should he require anticoagulation.  The importance of medical compliance for treatment of hypertension and diabetes was discussed at length with the patient today.   I have advised 30 day monitor rather than implantable loop recorder.  He will return to see the EP clinic in 6-8 weeks to follow-up on the results of his monitor.  If he is compliant with follow-up and has significant improvement in clinical state post stroke at that time, implantable loop recorder placement may be more reasonable.  Hillis RangeJames Valiant Dills MD, [facility] 02/10/2017 8:12 AM

## 2017-02-10 NOTE — Progress Notes (Signed)
Patient has consent to sign for TEE at this time, with assessment, pt noted alert and answered appropriately but unaware of TEE and not understanding what it is. RN attempted to call spouse at this time. No answer. Per ENDO staffs, procedure will be reschedule for tomorrow. Pt resumed diet at this time.   Sim BoastHavy, RN

## 2017-02-10 NOTE — Consult Note (Signed)
Physical Medicine and Rehabilitation Consult Reason for Consult: Left-sided weakness Referring Phsyician: Frank [name] is an 46 y.o. male.   HPI: 46 year old male with history of diabetes, hypertension, hyperlipidemia and medication noncompliance who had a right ACA stroke on 01/20/2017 and was [facility]. He was started on aspirin and Plavix for his CVA. Initial hemoglobin A1c was 11 point 2. LDL was 165, and triglycerides 298. During that hospitalization, he had evidence of depression as well as urinary retention and a Foley catheter was placed. He was discharged to a skilled nursing facility. While at [facility], patient had physical and occupational and speech therapy. On 02/08/2017. Patient had worsening of swallow function and was not able to swallow food or medications.  Patient was sent to the emergency department at [facility] and was admitted after MRI demonstrated small acute infarct in the right anterior thalamus. A subacute right ACA infarct was also demonstrated. Chronic microvascular ischemic changes advanced for age were noted. Chronic right occipital infarct was noted as well. Cardiology evaluated the patient, recommended transesophageal echocardiogram.  Physical therapy evaluated patient, transfers were assessed, Plus to maximum assist level needed for stand pivot transfers, bed mobility was mod assist times 2, occupational therapy and demonstrated max assist times 2 for toileting Speech therapy noted some slow assessing decreased alternating attention, initially was nothing by mouth, but after further evaluation. Regular diet, thin liquid and meds crushed in applesauce. Recommended by Speech Review of Systems  Constitutional: Negative for chills, fever and weight loss.  HENT: Negative for hearing loss and nosebleeds.   Eyes: Negative for double vision, pain and redness.  Respiratory: Negative for cough, sputum production, shortness of  breath, wheezing and stridor.   Cardiovascular: Negative for chest pain and palpitations.  Gastrointestinal: Negative for abdominal pain, constipation, diarrhea, heartburn, nausea and vomiting.  Genitourinary: Negative for dysuria, frequency and urgency.  Musculoskeletal: Negative for back pain, joint pain and neck pain.  Skin: Negative for itching and rash.  Neurological: Positive for sensory change, speech change and focal weakness.  Psychiatric/Behavioral: Negative for hallucinations. The patient is not nervous/anxious.     Past Medical History:  Diagnosis Date  . Abnormal ECG   . Acute ischemic right ACA stroke (HCC)   . Coronary artery disease, occlusive   . Dysfunction of eustachian tube   . History of right ACA stroke   . Hyperlipidemia associated with type 2 diabetes mellitus (HCC)   . Hypertension   . Impotence of organic origin   . Lumbago   . Lymphadenitis    Unspecified, except mesenteric  . Type 2 diabetes, uncontrolled, with retinopathy (HCC)   . Urinary retention 01/2017   History reviewed. No pertinent surgical history. Family History  Problem Relation Age of Onset  . Hypertension Mother   . Depression Mother   . Cancer Neg Hx   . Diabetes Neg Hx   . Stroke Neg Hx    Social History:  reports that he has never smoked. He has never used smokeless tobacco. He reports that he does not drink alcohol or use drugs. Allergies:  Allergies  Allergen Reactions  . Beta Adrenergic Blockers Other (See Comments)    "Fatigue," but this isn't noted on the patient's MAR   Medications Prior to Admission  Medication Sig Dispense Refill  . aspirin 325 MG tablet Take 325 mg by mouth daily.    Marland Kitchen. atorvastatin (LIPITOR) 80 MG tablet Take 80 mg by mouth at bedtime.     .Marland Kitchen  bisacodyl (DULCOLAX) 10 MG suppository Place 10 mg rectally once as needed (FOR CONSTIPATION IF NO RELIEF FROM MILK OF MAGNESIA).    . cloNIDine (CATAPRES) 0.2 MG tablet Take 0.2 mg by mouth 3 (three) times  daily.    . clopidogrel (PLAVIX) 75 MG tablet Take 75 mg by mouth daily.    Marland Kitchen diltiazem (CARDIZEM) 90 MG tablet Take 90 mg by mouth every 6 (six) hours.    Marland Kitchen escitalopram (LEXAPRO) 10 MG tablet Take 10 mg by mouth daily.    . insulin glargine (LANTUS) 100 UNIT/ML injection Inject 15 Units into the skin at bedtime.    . insulin lispro (HUMALOG) 100 UNIT/ML injection Inject 5 Units into the skin See admin instructions. THREE TIMES A DAY BEFORE EACH MEAL IF BGL >180 MG/DL    . magnesium hydroxide (MILK OF MAGNESIA) 400 MG/5ML suspension Take 30 mLs by mouth once as needed (FOR CONSTIPATION).    Marland Kitchen metFORMIN (GLUCOPHAGE) 1000 MG tablet Take 1,000 mg by mouth daily.    . nitroGLYCERIN (NITROSTAT) 0.4 MG SL tablet Place 0.4 mg under the tongue every 5 (five) minutes x 3 doses as needed for chest pain.     Marland Kitchen ondansetron (ZOFRAN) 4 MG tablet Take 4 mg by mouth every 4 (four) hours as needed for nausea or vomiting.    . Sodium Phosphates (RA SALINE ENEMA) 19-7 GM/118ML ENEM Place 1 enema rectally once as needed (FOR CONSTIPATION NOT RELIEVED BY DULCOLAX SUPPOSITORY AND CALL MD IF NO RELIEF FROM ENEMA).      Home: Home Living Family/patient expects to be discharged to:: Skilled nursing facility Additional Comments: Pt from [facility] prior to current admission  Functional History: Prior Function Comments: history obtained from notes; Prior to CVA in early July he was completely independent (has MBA and works in Engineer, manufacturing) Functional Status:  Mobility:          ADL: ADL Grooming Details (indicate cue type and reason): mod A to sit EOB and perform grooming as Pt requires assist from RUE for seated balance Toilet Transfer Details (indicate cue type and reason): simulated through recliner transfer. Pt has foley  Cognition: Cognition Overall Cognitive Status: Impaired/Different from baseline Orientation Level: Oriented X4 Cognition Arousal/Alertness: Awake/alert Behavior During Therapy: Flat  affect Overall Cognitive Status: Impaired/Different from baseline Area of Impairment: Attention, Following commands, Safety/judgement, Awareness Current Attention Level: Sustained Following Commands: Follows one step commands inconsistently, Follows one step commands with increased time Safety/Judgement: Decreased awareness of safety, Decreased awareness of deficits Awareness: Intellectual  Blood pressure (!) 189/84, pulse 90, temperature 98.9 F (37.2 C), temperature source Oral, resp. rate 20, height 6\' 2"  (1.88 m), weight 103.7 kg (228 lb 11.2 oz), SpO2 97 %. Physical Exam  Nursing note and vitals reviewed. Constitutional: He is oriented to person, place, and time. He appears well-developed and well-nourished. No distress.  HENT:  Head: Normocephalic and atraumatic.  Eyes: Pupils are equal, round, and reactive to light. Conjunctivae are normal. Right eye exhibits no discharge. Left eye exhibits no discharge. No scleral icterus.  Neck: No JVD present.  Respiratory: No stridor.  Neurological: He is alert and oriented to person, place, and time. A sensory deficit is present. Coordination and gait abnormal.  Left upper and left lower limb with reduced sensation. Left neglect noted on confrontation testing, but visual fields are intact.  Motor strength is 5/5 in the right deltoid, biceps, wrist and grip, hip flexion, knee extension, ankle dorsi flexion. Left upper extremity has 3 minus strength, difficulty  with volitional movement. Left lower extremity has 0/5 strength. Mild triple flexor reflex   Skin: He is not diaphoretic.  Psychiatric: His affect is blunt. His speech is delayed. He is slowed and withdrawn. He does not express impulsivity.  Oriented to person, place and time, as well as situation. He is inattentive.    Results for orders placed or performed during the hospital encounter of 02/08/17 (from the past 24 hour(s))  Glucose, capillary     Status: Abnormal   Collection  Time: 02/09/17  5:04 PM  Result Value Ref Range   Glucose-Capillary 120 (H) 65 - 99 mg/dL  Glucose, capillary     Status: Abnormal   Collection Time: 02/09/17  9:55 PM  Result Value Ref Range   Glucose-Capillary 124 (H) 65 - 99 mg/dL   Comment 1 Notify RN    Comment 2 Document in Chart   CBC     Status: Abnormal   Collection Time: 02/10/17  4:09 AM  Result Value Ref Range   WBC 5.2 4.0 - 10.5 K/uL   RBC 4.36 4.22 - 5.81 MIL/uL   Hemoglobin 11.5 (L) 13.0 - 17.0 g/dL   HCT 16.1 (L) 09.6 - 04.5 %   MCV 79.4 78.0 - 100.0 fL   MCH 26.4 26.0 - 34.0 pg   MCHC 33.2 30.0 - 36.0 g/dL   RDW 40.9 81.1 - 91.4 %   Platelets 296 150 - 400 K/uL  Basic metabolic panel     Status: Abnormal   Collection Time: 02/10/17  4:09 AM  Result Value Ref Range   Sodium 134 (L) 135 - 145 mmol/L   Potassium 3.8 3.5 - 5.1 mmol/L   Chloride 100 (L) 101 - 111 mmol/L   CO2 25 22 - 32 mmol/L   Glucose, Bld 98 65 - 99 mg/dL   BUN 20 6 - 20 mg/dL   Creatinine, Ser 7.82 (H) 0.61 - 1.24 mg/dL   Calcium 8.7 (L) 8.9 - 10.3 mg/dL   GFR calc non Af Amer 49 (L) >60 mL/min   GFR calc Af Amer 57 (L) >60 mL/min   Anion gap 9 5 - 15  Glucose, capillary     Status: Abnormal   Collection Time: 02/10/17  6:42 AM  Result Value Ref Range   Glucose-Capillary 102 (H) 65 - 99 mg/dL   Comment 1 Notify RN    Comment 2 Document in Chart   Glucose, capillary     Status: Abnormal   Collection Time: 02/10/17 11:26 AM  Result Value Ref Range   Glucose-Capillary 161 (H) 65 - 99 mg/dL   Ct Angio Head W Or Wo Contrast  Result Date: 02/08/2017 CLINICAL DATA:  Initial evaluation for acute discs are. EXAM: CT ANGIOGRAPHY HEAD AND NECK TECHNIQUE: Multidetector CT imaging of the head and neck was performed using the standard protocol during bolus administration of intravenous contrast. Multiplanar CT image reconstructions and MIPs were obtained to evaluate the vascular anatomy. Carotid stenosis measurements (when applicable) are obtained  utilizing NASCET criteria, using the distal internal carotid diameter as the denominator. CONTRAST:  50 cc of Isovue 370. COMPARISON:  Prior CT from earlier same day. FINDINGS: CTA NECK FINDINGS Aortic arch: Visualized aortic arch of normal caliber with normal 3 vessel morphology. No flow-limiting stenosis about the origin of the great vessels. Minimal plaque noted within the proximal descending intrathoracic aorta. Visualized subclavian artery is widely patent. Right carotid system: Right common and internal carotid artery's widely patent without stenosis, dissection, or occlusion.  Left carotid system: Left common and internal carotid artery's widely patent without stenosis, dissection, or occlusion. Vertebral arteries: Both of the vertebral arteries arise from the subclavian arteries. Left vertebral artery slightly dominant. Vertebral arteries widely patent within the neck without stenosis, dissection, or occlusion. Skeleton: No acute osseus abnormality. No worrisome lytic or blastic osseous lesions. Other neck: No acute soft tissue abnormality within the neck. Salivary glands normal. Thyroid normal. No adenopathy. Upper chest: Visualized upper chest within normal limits. Partially visualized lungs are clear. Review of the MIP images confirms the above findings CTA HEAD FINDINGS Anterior circulation: Petrous, cavernous, and supraclinoid segments of the internal carotid arteries are widely patent without flow-limiting stenosis. ICA termini widely patent. A1 segments patent bilaterally. Anterior communicating artery normal. Multifocal irregularity within the A2 segments bilaterally with moderate to severe stenoses (Series 7, image 75, 72), likely atheromatous. ACA is are patent to their distal aspects. M1 segments patent bilaterally without stenosis or occlusion MCA bifurcations normal. No proximal M2 occlusion. Distal MCA branches demonstrate atheromatous irregularity but are patent and symmetric bilaterally.  Posterior circulation: Focal plaque with severe left V4 stenosis (series 8, image 123). Left vertebral artery patent distally to the vertebrobasilar junction. Focal severe stenosis within the diminutive right vertebral artery (series 8, image 123). Right vertebral artery otherwise patent to the vertebrobasilar junction. Posterior inferior cerebral arteries not well evaluated on this exam. Vertebrobasilar junction normal. Basilar artery widely patent to its distal aspect. Superior cerebral arteries patent bilaterally. Posterior cerebral arteries largely supplied via the basilar artery. Short-segment moderate mid left P2 stenosis (series 8, image 123). Left PCA irregular but patent 2 its distal aspects. Right PCA somewhat attenuated and irregular with moderate to severe multifocal stenoses involving the right P2 segment (series 8, image 128). Right PCA also patent to its distal aspect. Small left posterior communicating artery noted. Venous sinuses: Not well evaluated on this exam due to arterial bolus timing. Anatomic variants: None.  No aneurysm or vascular malformation. Delayed phase: No pathologic enhancement. Subacute to chronic right ACA territory infarcts again noted. Additional remote infarcts involving the right PCA territory. Remote right basal ganglia infarcts. Right maxillary sinus disease. Review of the MIP images confirms the above findings IMPRESSION: 1. Negative CTA for emergent large vessel occlusion. 2. Bilateral severe V4 stenoses as above. 3. Multifocal moderate to severe atheromatous is stenoses involving the bilateral A2 and P2 segments as above, most severe within the right PCA. 4. Negative CTA of the neck without flow-limiting stenosis or significant atheromatous disease. Electronically Signed   By: Rise Mu M.D.   On: 02/08/2017 20:36   Dg Chest 2 View  Result Date: 02/08/2017 CLINICAL DATA:  Recent TIA EXAM: CHEST  2 VIEW COMPARISON:  None. FINDINGS: Cardiac shadow is mildly  prominent but accentuated by the frontal technique. No focal infiltrate or sizable effusion is seen. No bony abnormality is noted. IMPRESSION: No active cardiopulmonary disease. Electronically Signed   By: Alcide Clever M.D.   On: 02/08/2017 22:26   Ct Angio Neck W And/or Wo Contrast  Result Date: 02/08/2017 CLINICAL DATA:  Initial evaluation for acute discs are. EXAM: CT ANGIOGRAPHY HEAD AND NECK TECHNIQUE: Multidetector CT imaging of the head and neck was performed using the standard protocol during bolus administration of intravenous contrast. Multiplanar CT image reconstructions and MIPs were obtained to evaluate the vascular anatomy. Carotid stenosis measurements (when applicable) are obtained utilizing NASCET criteria, using the distal internal carotid diameter as the denominator. CONTRAST:  50 cc of Isovue  370. COMPARISON:  Prior CT from earlier same day. FINDINGS: CTA NECK FINDINGS Aortic arch: Visualized aortic arch of normal caliber with normal 3 vessel morphology. No flow-limiting stenosis about the origin of the great vessels. Minimal plaque noted within the proximal descending intrathoracic aorta. Visualized subclavian artery is widely patent. Right carotid system: Right common and internal carotid artery's widely patent without stenosis, dissection, or occlusion. Left carotid system: Left common and internal carotid artery's widely patent without stenosis, dissection, or occlusion. Vertebral arteries: Both of the vertebral arteries arise from the subclavian arteries. Left vertebral artery slightly dominant. Vertebral arteries widely patent within the neck without stenosis, dissection, or occlusion. Skeleton: No acute osseus abnormality. No worrisome lytic or blastic osseous lesions. Other neck: No acute soft tissue abnormality within the neck. Salivary glands normal. Thyroid normal. No adenopathy. Upper chest: Visualized upper chest within normal limits. Partially visualized lungs are clear. Review  of the MIP images confirms the above findings CTA HEAD FINDINGS Anterior circulation: Petrous, cavernous, and supraclinoid segments of the internal carotid arteries are widely patent without flow-limiting stenosis. ICA termini widely patent. A1 segments patent bilaterally. Anterior communicating artery normal. Multifocal irregularity within the A2 segments bilaterally with moderate to severe stenoses (Series 7, image 75, 72), likely atheromatous. ACA is are patent to their distal aspects. M1 segments patent bilaterally without stenosis or occlusion MCA bifurcations normal. No proximal M2 occlusion. Distal MCA branches demonstrate atheromatous irregularity but are patent and symmetric bilaterally. Posterior circulation: Focal plaque with severe left V4 stenosis (series 8, image 123). Left vertebral artery patent distally to the vertebrobasilar junction. Focal severe stenosis within the diminutive right vertebral artery (series 8, image 123). Right vertebral artery otherwise patent to the vertebrobasilar junction. Posterior inferior cerebral arteries not well evaluated on this exam. Vertebrobasilar junction normal. Basilar artery widely patent to its distal aspect. Superior cerebral arteries patent bilaterally. Posterior cerebral arteries largely supplied via the basilar artery. Short-segment moderate mid left P2 stenosis (series 8, image 123). Left PCA irregular but patent 2 its distal aspects. Right PCA somewhat attenuated and irregular with moderate to severe multifocal stenoses involving the right P2 segment (series 8, image 128). Right PCA also patent to its distal aspect. Small left posterior communicating artery noted. Venous sinuses: Not well evaluated on this exam due to arterial bolus timing. Anatomic variants: None.  No aneurysm or vascular malformation. Delayed phase: No pathologic enhancement. Subacute to chronic right ACA territory infarcts again noted. Additional remote infarcts involving the right PCA  territory. Remote right basal ganglia infarcts. Right maxillary sinus disease. Review of the MIP images confirms the above findings IMPRESSION: 1. Negative CTA for emergent large vessel occlusion. 2. Bilateral severe V4 stenoses as above. 3. Multifocal moderate to severe atheromatous is stenoses involving the bilateral A2 and P2 segments as above, most severe within the right PCA. 4. Negative CTA of the neck without flow-limiting stenosis or significant atheromatous disease. Electronically Signed   By: Rise Mu M.D.   On: 02/08/2017 20:36   Mr Brain Wo Contrast  Result Date: 02/09/2017 CLINICAL DATA:  46 y/o M; acute dysphagia. Right ACA stroke earlier this month. EXAM: MRI HEAD WITHOUT CONTRAST TECHNIQUE: Multiplanar, multiecho pulse sequences of the brain and surrounding structures were obtained without intravenous contrast. COMPARISON:  02/08/2017 CT angiogram of the head. FINDINGS: Brain: Right ACA distribution diffusion hyperintensity with few areas of mildly reduced diffusion, T2 FLAIR hyperintense signal abnormality, and local mass effect compatible with subacute infarction. Subcentimeter focus of clearly reduced diffusion within  the right anterior thalamus compatible with acute infarction. Background of foci of T2 FLAIR hyperintense signal abnormality in white matter compatible with chronic microvascular ischemic changes, advanced for age and brain parenchymal volume loss. Chronic infarction within the right occipital lobe. No herniation or acute hemorrhage identified. Vascular: Normal flow voids. Skull and upper cervical spine: Normal marrow signal. Sinuses/Orbits: Right frontal, anterior ethmoid, and maxillary sinus opacification, right middle meatus obstructive pattern. No abnormal signal of mastoid air cells. Orbits are unremarkable. Other: None. IMPRESSION: 1. Subcentimeter acute infarction within right anterior thalamus. No acute hemorrhage. 2. Right ACA distribution subacute  infarction. Mild local mass effect. No herniation. 3. Background of chronic microvascular ischemic changes and parenchymal volume loss of the brain, advanced for age. 4. Chronic right occipital infarction. 5. Paranasal sinus disease with a right middle meatus obstructive pattern, direct visualization recommended. These results will be called to the ordering clinician or representative by the Radiologist Assistant, and communication documented in the PACS or zVision Dashboard. Electronically Signed   By: Mitzi Hansen M.D.   On: 02/09/2017 06:36   Ct Head Code Stroke W/o Cm  Result Date: 02/08/2017 CLINICAL DATA:  Code stroke. New onset dysphagia. Previous ACA territory infarct. Left hemi paresis. EXAM: CT HEAD WITHOUT CONTRAST TECHNIQUE: Contiguous axial images were obtained from the base of the skull through the vertex without intravenous contrast. COMPARISON:  None. FINDINGS: Brain: A subacute/early chronic right ACA territory infarct is noted. There is some volume loss. No associated hemorrhage is present. A more remote medial and inferior right occipital lobe infarct is present. Ischemic changes within the right internal capsule appear remote as well. No acute cortical infarct, hemorrhage, or mass lesion is present. The basal ganglia are otherwise intact. The insular ribbon is normal. No other focal cortical lesions are present. The ventricles are of normal size. No significant extra-axial fluid collection is present. Vascular: No hyperdense vessel or unexpected calcification. Skull: The calvarium is intact. No focal lytic or blastic lesions are present. Sinuses/Orbits: The right maxillary sinus is opacified. The ostiomeatal complex is occluded. Anterior right ethmoid and frontal sinus opacification is noted as well. The left paranasal sinuses and bilateral sphenoid sinuses are clear. The mastoid air cells are clear. ASPECTS [facility] Stroke Program Early CT Score) - Ganglionic level infarction  (caudate, lentiform nuclei, internal capsule, insula, M1-M3 cortex): 7/7 - Supraganglionic infarction (M4-M6 cortex): 3/3 Total score (0-10 with 10 being normal): 10/10 IMPRESSION: 1. Subacute to early chronic right ACA territory infarcts without hemorrhage. There is some volume loss. 2. More remote infarct in the inferior medial right occipital lobe. 3. Remote infarct of the right internal capsule. 4. No acute infarct. 5. Extensive anterior right-sided sinus disease 6. ASPECTS is 10/10 The These results were called by telephone at the time of interpretation on 02/08/2017 at 4:27 pm to Dr. Wilford Corner, who verbally acknowledged these results. Electronically Signed   By: Marin Roberts M.D.   On: 02/08/2017 16:28    Assessment/Plan: Diagnosis: Left hemiparesis, left neglect secondary to right ACA infarct, left hemisensory deficits made in addition be related to the small right thalamic infarct 1. Does the need for close, 24 hr/day medical supervision in concert with the patient's rehab needs make it unreasonable for this patient to be served in a less intensive setting? Yes 2. Co-Morbidities requiring supervision/potential complications: Diabetes, hypertension 3. Due to bladder management, bowel management, safety, skin/wound care, disease management, medication administration, pain management and patient education, does the patient require 24 hr/day rehab nursing? Yes  4. Does the patient require coordinated care of a physician, rehab nurse, PT (1-2 hrs/day, 5 days/week), OT (1-2 hrs/day, 5 days/week) and SLP (.5-1 hrs/day, 5 days/week) to address physical and functional deficits in the context of the above medical diagnosis(es)? Yes Addressing deficits in the following areas: balance, endurance, locomotion, strength, transferring, bowel/bladder control, bathing, dressing, feeding, grooming, toileting, cognition, speech, swallowing and psychosocial support 5. Can the patient actively participate in an  intensive therapy program of at least 3 hrs of therapy per day at least 5 days per week? Yes 6. The potential for patient to make measurable gains while on inpatient rehab is excellent 7. Anticipated functional outcomes upon discharge from inpatients are min assist PT, min assist OT, min assist. Cognition, mod I swallow SLP 8. Estimated rehab length of stay to reach the above functional goals is: 21-25 days 9. Does the patient have adequate social supports to accommodate these discharge functional goals? Yes 10. Anticipated D/C setting: Home 11. Anticipated post D/C treatments: HH therapy 12. Overall Rehab/Functional Prognosis: good  RECOMMENDATIONS: This patient's condition is appropriate for continued rehabilitative care in the following setting: CIR Patient has agreed to participate in recommended program. Yes Note that insurance prior authorization may be required for reimbursement for recommended care.  Comment:   Erick ColaceKIRSTEINS,ANDREW E 02/10/2017

## 2017-02-10 NOTE — Progress Notes (Signed)
I will begin insurance authorization for a possible inpt rehab admit pending [place] approval . Fae PippinMelissa Bowie will follow up tomoorw. She can be reached at [phone].

## 2017-02-11 ENCOUNTER — Inpatient Hospital Stay (HOSPITAL_COMMUNITY): Payer: 59

## 2017-02-11 ENCOUNTER — Inpatient Hospital Stay (HOSPITAL_COMMUNITY): Payer: 59 | Admitting: Certified Registered Nurse Anesthetist

## 2017-02-11 ENCOUNTER — Encounter (HOSPITAL_COMMUNITY): Payer: Self-pay | Admitting: *Deleted

## 2017-02-11 ENCOUNTER — Inpatient Hospital Stay (HOSPITAL_COMMUNITY)
Admission: RE | Admit: 2017-02-11 | Discharge: 2017-03-11 | DRG: 057 | Disposition: A | Discharge status: Home Health | Payer: 59 | Source: Intra-hospital | Attending: Physical Medicine & Rehabilitation | Admitting: Physical Medicine & Rehabilitation

## 2017-02-11 ENCOUNTER — Encounter (HOSPITAL_COMMUNITY)
Admission: EM | Disposition: A | Discharge status: Rehabilitation Facility | Payer: Self-pay | Source: Home / Self Care | Attending: Internal Medicine

## 2017-02-11 DIAGNOSIS — I129 Hypertensive chronic kidney disease with stage 1 through stage 4 chronic kidney disease, or unspecified chronic kidney disease: Secondary | ICD-10-CM | POA: Diagnosis present

## 2017-02-11 DIAGNOSIS — E785 Hyperlipidemia, unspecified: Secondary | ICD-10-CM | POA: Diagnosis present

## 2017-02-11 DIAGNOSIS — R339 Retention of urine, unspecified: Secondary | ICD-10-CM

## 2017-02-11 DIAGNOSIS — R2689 Other abnormalities of gait and mobility: Secondary | ICD-10-CM | POA: Diagnosis present

## 2017-02-11 DIAGNOSIS — Z9114 Patient's other noncompliance with medication regimen: Secondary | ICD-10-CM

## 2017-02-11 DIAGNOSIS — M545 Low back pain: Secondary | ICD-10-CM | POA: Diagnosis present

## 2017-02-11 DIAGNOSIS — E441 Mild protein-calorie malnutrition: Secondary | ICD-10-CM | POA: Diagnosis present

## 2017-02-11 DIAGNOSIS — M25562 Pain in left knee: Secondary | ICD-10-CM | POA: Diagnosis present

## 2017-02-11 DIAGNOSIS — Z6829 Body mass index (BMI) 29.0-29.9, adult: Secondary | ICD-10-CM

## 2017-02-11 DIAGNOSIS — I169 Hypertensive crisis, unspecified: Secondary | ICD-10-CM | POA: Diagnosis not present

## 2017-02-11 DIAGNOSIS — I69319 Unspecified symptoms and signs involving cognitive functions following cerebral infarction: Secondary | ICD-10-CM | POA: Diagnosis not present

## 2017-02-11 DIAGNOSIS — I69312 Visuospatial deficit and spatial neglect following cerebral infarction: Secondary | ICD-10-CM | POA: Diagnosis not present

## 2017-02-11 DIAGNOSIS — E1165 Type 2 diabetes mellitus with hyperglycemia: Secondary | ICD-10-CM | POA: Diagnosis present

## 2017-02-11 DIAGNOSIS — E8809 Other disorders of plasma-protein metabolism, not elsewhere classified: Secondary | ICD-10-CM

## 2017-02-11 DIAGNOSIS — I639 Cerebral infarction, unspecified: Secondary | ICD-10-CM

## 2017-02-11 DIAGNOSIS — I2510 Atherosclerotic heart disease of native coronary artery without angina pectoris: Secondary | ICD-10-CM | POA: Diagnosis present

## 2017-02-11 DIAGNOSIS — Z79899 Other long term (current) drug therapy: Secondary | ICD-10-CM

## 2017-02-11 DIAGNOSIS — Z794 Long term (current) use of insulin: Secondary | ICD-10-CM

## 2017-02-11 DIAGNOSIS — N183 Chronic kidney disease, stage 3 unspecified: Secondary | ICD-10-CM

## 2017-02-11 DIAGNOSIS — R414 Neurologic neglect syndrome: Secondary | ICD-10-CM | POA: Diagnosis present

## 2017-02-11 DIAGNOSIS — E119 Type 2 diabetes mellitus without complications: Secondary | ICD-10-CM

## 2017-02-11 DIAGNOSIS — Z8249 Family history of ischemic heart disease and other diseases of the circulatory system: Secondary | ICD-10-CM

## 2017-02-11 DIAGNOSIS — E46 Unspecified protein-calorie malnutrition: Secondary | ICD-10-CM

## 2017-02-11 DIAGNOSIS — Z818 Family history of other mental and behavioral disorders: Secondary | ICD-10-CM

## 2017-02-11 DIAGNOSIS — M62838 Other muscle spasm: Secondary | ICD-10-CM | POA: Diagnosis not present

## 2017-02-11 DIAGNOSIS — I679 Cerebrovascular disease, unspecified: Secondary | ICD-10-CM | POA: Diagnosis present

## 2017-02-11 DIAGNOSIS — Z888 Allergy status to other drugs, medicaments and biological substances status: Secondary | ICD-10-CM

## 2017-02-11 DIAGNOSIS — I6930 Unspecified sequelae of cerebral infarction: Secondary | ICD-10-CM

## 2017-02-11 DIAGNOSIS — F329 Major depressive disorder, single episode, unspecified: Secondary | ICD-10-CM | POA: Diagnosis present

## 2017-02-11 DIAGNOSIS — Z7982 Long term (current) use of aspirin: Secondary | ICD-10-CM

## 2017-02-11 DIAGNOSIS — D62 Acute posthemorrhagic anemia: Secondary | ICD-10-CM | POA: Diagnosis present

## 2017-02-11 DIAGNOSIS — Z7902 Long term (current) use of antithrombotics/antiplatelets: Secondary | ICD-10-CM

## 2017-02-11 DIAGNOSIS — R4189 Other symptoms and signs involving cognitive functions and awareness: Secondary | ICD-10-CM | POA: Diagnosis present

## 2017-02-11 DIAGNOSIS — E871 Hypo-osmolality and hyponatremia: Secondary | ICD-10-CM

## 2017-02-11 DIAGNOSIS — G8110 Spastic hemiplegia affecting unspecified side: Secondary | ICD-10-CM

## 2017-02-11 DIAGNOSIS — G8194 Hemiplegia, unspecified affecting left nondominant side: Secondary | ICD-10-CM

## 2017-02-11 DIAGNOSIS — I69392 Facial weakness following cerebral infarction: Secondary | ICD-10-CM

## 2017-02-11 DIAGNOSIS — G8114 Spastic hemiplegia affecting left nondominant side: Secondary | ICD-10-CM | POA: Diagnosis present

## 2017-02-11 DIAGNOSIS — Z8673 Personal history of transient ischemic attack (TIA), and cerebral infarction without residual deficits: Secondary | ICD-10-CM

## 2017-02-11 DIAGNOSIS — I69354 Hemiplegia and hemiparesis following cerebral infarction affecting left non-dominant side: Secondary | ICD-10-CM | POA: Diagnosis not present

## 2017-02-11 DIAGNOSIS — E11319 Type 2 diabetes mellitus with unspecified diabetic retinopathy without macular edema: Secondary | ICD-10-CM | POA: Diagnosis present

## 2017-02-11 DIAGNOSIS — I10 Essential (primary) hypertension: Secondary | ICD-10-CM | POA: Diagnosis not present

## 2017-02-11 DIAGNOSIS — N39498 Other specified urinary incontinence: Secondary | ICD-10-CM | POA: Diagnosis present

## 2017-02-11 DIAGNOSIS — I6381 Other cerebral infarction due to occlusion or stenosis of small artery: Secondary | ICD-10-CM | POA: Diagnosis present

## 2017-02-11 DIAGNOSIS — E1159 Type 2 diabetes mellitus with other circulatory complications: Secondary | ICD-10-CM | POA: Diagnosis not present

## 2017-02-11 DIAGNOSIS — I63521 Cerebral infarction due to unspecified occlusion or stenosis of right anterior cerebral artery: Secondary | ICD-10-CM | POA: Diagnosis not present

## 2017-02-11 HISTORY — PX: TEE WITHOUT CARDIOVERSION: SHX5443

## 2017-02-11 LAB — GLUCOSE, CAPILLARY
GLUCOSE-CAPILLARY: 82 mg/dL (ref 65–99)
Glucose-Capillary: 115 mg/dL — ABNORMAL HIGH (ref 65–99)

## 2017-02-11 SURGERY — ECHOCARDIOGRAM, TRANSESOPHAGEAL
Anesthesia: Monitor Anesthesia Care

## 2017-02-11 SURGERY — ECHOCARDIOGRAM, TRANSESOPHAGEAL
Anesthesia: Choice

## 2017-02-11 MED ORDER — CLOPIDOGREL BISULFATE 75 MG PO TABS
75.0000 mg | ORAL_TABLET | Freq: Every day | ORAL | Status: DC
  Administered 2017-02-12 – 2017-03-11 (×28): 75 mg via ORAL
  Filled 2017-02-11 (×30): qty 1

## 2017-02-11 MED ORDER — PROPOFOL 500 MG/50ML IV EMUL
INTRAVENOUS | Status: DC | PRN
  Administered 2017-02-11: 100 ug/kg/min via INTRAVENOUS

## 2017-02-11 MED ORDER — SODIUM CHLORIDE 0.9 % IV SOLN
INTRAVENOUS | Status: DC
  Administered 2017-02-11: 10:00:00 via INTRAVENOUS

## 2017-02-11 MED ORDER — BUTAMBEN-TETRACAINE-BENZOCAINE 2-2-14 % EX AERO
INHALATION_SPRAY | CUTANEOUS | Status: DC | PRN
Start: 2017-02-11 — End: 2017-02-11
  Administered 2017-02-11: 2 via TOPICAL

## 2017-02-11 MED ORDER — ENOXAPARIN SODIUM 40 MG/0.4ML ~~LOC~~ SOLN
40.0000 mg | SUBCUTANEOUS | Status: DC
  Administered 2017-02-11 – 2017-03-10 (×28): 40 mg via SUBCUTANEOUS
  Filled 2017-02-11 (×28): qty 0.4

## 2017-02-11 MED ORDER — ATORVASTATIN CALCIUM 80 MG PO TABS
80.0000 mg | ORAL_TABLET | Freq: Every day | ORAL | Status: DC
  Administered 2017-02-11 – 2017-03-10 (×28): 80 mg via ORAL
  Filled 2017-02-11 (×28): qty 1

## 2017-02-11 MED ORDER — ASPIRIN EC 325 MG PO TBEC
325.0000 mg | DELAYED_RELEASE_TABLET | Freq: Every day | ORAL | Status: DC
  Administered 2017-02-12 – 2017-02-27 (×16): 325 mg via ORAL
  Filled 2017-02-11 (×18): qty 1

## 2017-02-11 MED ORDER — ACETAMINOPHEN 325 MG PO TABS
325.0000 mg | ORAL_TABLET | ORAL | Status: DC | PRN
  Administered 2017-02-13: 650 mg via ORAL
  Filled 2017-02-11: qty 2

## 2017-02-11 MED ORDER — ENSURE ENLIVE PO LIQD
237.0000 mL | Freq: Two times a day (BID) | ORAL | Status: DC
  Administered 2017-02-12 – 2017-02-17 (×7): 237 mL via ORAL

## 2017-02-11 MED ORDER — AMLODIPINE BESYLATE 5 MG PO TABS: 5.0000 mg | ORAL_TABLET | Freq: Every day | ORAL | Status: DC

## 2017-02-11 MED ORDER — ALUM & MAG HYDROXIDE-SIMETH 200-200-20 MG/5ML PO SUSP: 30.0000 mL | ORAL | Status: DC | PRN

## 2017-02-11 MED ORDER — INSULIN ASPART 100 UNIT/ML ~~LOC~~ SOLN: 0.0000 [IU] | Freq: Every day | SUBCUTANEOUS | 11 refills | Status: DC

## 2017-02-11 MED ORDER — PROPOFOL 10 MG/ML IV BOLUS
INTRAVENOUS | Status: DC | PRN
  Administered 2017-02-11 (×3): 10 mg via INTRAVENOUS

## 2017-02-11 MED ORDER — TRAZODONE HCL 50 MG PO TABS: 25.0000 mg | ORAL_TABLET | Freq: Every evening | ORAL | Status: DC | PRN

## 2017-02-11 MED ORDER — INSULIN ASPART 100 UNIT/ML ~~LOC~~ SOLN: 0.0000 [IU] | Freq: Three times a day (TID) | SUBCUTANEOUS | 11 refills | Status: DC

## 2017-02-11 MED ORDER — SODIUM CHLORIDE 0.9 % IV SOLN: INTRAVENOUS | Status: DC

## 2017-02-11 MED ORDER — GUAIFENESIN-DM 100-10 MG/5ML PO SYRP: 5.0000 mL | ORAL_SOLUTION | Freq: Four times a day (QID) | ORAL | Status: DC | PRN

## 2017-02-11 MED ORDER — BISACODYL 10 MG RE SUPP
10.0000 mg | Freq: Every day | RECTAL | Status: DC | PRN
  Administered 2017-02-28: 10 mg via RECTAL
  Filled 2017-02-11: qty 1

## 2017-02-11 MED ORDER — SENNOSIDES-DOCUSATE SODIUM 8.6-50 MG PO TABS
1.0000 | ORAL_TABLET | Freq: Every evening | ORAL | Status: DC | PRN
  Administered 2017-02-15: 1 via ORAL
  Filled 2017-02-11: qty 1

## 2017-02-11 MED ORDER — PROCHLORPERAZINE MALEATE 5 MG PO TABS: 5.0000 mg | ORAL_TABLET | Freq: Four times a day (QID) | ORAL | Status: DC | PRN

## 2017-02-11 MED ORDER — DIPHENHYDRAMINE HCL 12.5 MG/5ML PO ELIX
12.5000 mg | ORAL_SOLUTION | Freq: Four times a day (QID) | ORAL | Status: DC | PRN
  Administered 2017-02-13: 25 mg via ORAL
  Filled 2017-02-11: qty 10

## 2017-02-11 MED ORDER — FLEET ENEMA 7-19 GM/118ML RE ENEM
1.0000 | ENEMA | Freq: Once | RECTAL | Status: AC | PRN
  Administered 2017-02-16: 1 via RECTAL
  Filled 2017-02-11: qty 1

## 2017-02-11 MED ORDER — PROCHLORPERAZINE 25 MG RE SUPP
12.5000 mg | Freq: Four times a day (QID) | RECTAL | Status: DC | PRN
  Filled 2017-02-11: qty 1

## 2017-02-11 MED ORDER — MIDAZOLAM HCL 5 MG/ML IJ SOLN
INTRAMUSCULAR | Status: AC
  Filled 2017-02-11: qty 2

## 2017-02-11 MED ORDER — PROCHLORPERAZINE EDISYLATE 5 MG/ML IJ SOLN: 5.0000 mg | Freq: Four times a day (QID) | INTRAMUSCULAR | Status: DC | PRN

## 2017-02-11 MED ORDER — POLYETHYLENE GLYCOL 3350 17 G PO PACK
17.0000 g | PACK | Freq: Every day | ORAL | Status: DC | PRN
  Administered 2017-02-15: 17 g via ORAL
  Filled 2017-02-11 (×2): qty 1

## 2017-02-11 MED ORDER — FENTANYL CITRATE (PF) 100 MCG/2ML IJ SOLN
INTRAMUSCULAR | Status: AC
  Filled 2017-02-11: qty 2

## 2017-02-11 MED ORDER — INSULIN GLARGINE 100 UNIT/ML ~~LOC~~ SOLN
15.0000 [IU] | Freq: Every day | SUBCUTANEOUS | Status: DC
  Administered 2017-02-11 – 2017-02-15 (×5): 15 [IU] via SUBCUTANEOUS
  Filled 2017-02-11 (×6): qty 0.15

## 2017-02-11 MED ORDER — INSULIN ASPART 100 UNIT/ML ~~LOC~~ SOLN
0.0000 [IU] | Freq: Three times a day (TID) | SUBCUTANEOUS | Status: DC
  Administered 2017-02-12 – 2017-02-15 (×9): 3 [IU] via SUBCUTANEOUS
  Administered 2017-02-16: 5 [IU] via SUBCUTANEOUS
  Administered 2017-02-16 – 2017-02-17 (×2): 2 [IU] via SUBCUTANEOUS
  Administered 2017-02-17: 5 [IU] via SUBCUTANEOUS
  Administered 2017-02-17: 3 [IU] via SUBCUTANEOUS
  Administered 2017-02-18: 8 [IU] via SUBCUTANEOUS
  Administered 2017-02-18 (×2): 2 [IU] via SUBCUTANEOUS
  Administered 2017-02-19: 5 [IU] via SUBCUTANEOUS
  Administered 2017-02-19 – 2017-02-20 (×2): 3 [IU] via SUBCUTANEOUS
  Administered 2017-02-20: 2 [IU] via SUBCUTANEOUS
  Administered 2017-02-21: 3 [IU] via SUBCUTANEOUS
  Administered 2017-02-21 – 2017-02-23 (×5): 2 [IU] via SUBCUTANEOUS
  Administered 2017-02-23 – 2017-02-25 (×3): 3 [IU] via SUBCUTANEOUS
  Administered 2017-02-25: 2 [IU] via SUBCUTANEOUS
  Administered 2017-02-27 – 2017-02-28 (×3): 3 [IU] via SUBCUTANEOUS
  Administered 2017-03-01: 2 [IU] via SUBCUTANEOUS
  Administered 2017-03-01: 3 [IU] via SUBCUTANEOUS
  Administered 2017-03-02 – 2017-03-10 (×5): 2 [IU] via SUBCUTANEOUS

## 2017-02-11 MED ORDER — ESCITALOPRAM OXALATE 10 MG PO TABS
10.0000 mg | ORAL_TABLET | Freq: Every day | ORAL | Status: DC
  Administered 2017-02-12 – 2017-03-11 (×28): 10 mg via ORAL
  Filled 2017-02-11 (×29): qty 1

## 2017-02-11 MED ORDER — CLONIDINE HCL 0.1 MG PO TABS
0.1000 mg | ORAL_TABLET | Freq: Four times a day (QID) | ORAL | Status: DC | PRN
  Administered 2017-02-14: 0.1 mg via ORAL
  Filled 2017-02-11: qty 1

## 2017-02-11 MED ORDER — INSULIN ASPART 100 UNIT/ML ~~LOC~~ SOLN
0.0000 [IU] | Freq: Every day | SUBCUTANEOUS | Status: DC
  Administered 2017-02-14 – 2017-02-22 (×4): 2 [IU] via SUBCUTANEOUS

## 2017-02-11 MED ORDER — SODIUM CHLORIDE 0.9 % IV SOLN
INTRAVENOUS | Status: DC | PRN
  Administered 2017-02-11: 11:00:00 via INTRAVENOUS

## 2017-02-11 NOTE — Progress Notes (Signed)
STROKE TEAM PROGRESS NOTE   HPI: Frank Moss is a 46 y.o. 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 He was last seen normal sometime during this morning when he had his breakfast and was able to eat chew and swallow without problems.  He was [facility]/2017 for acute onset of this right ACA stroke. I was able to access the reports of the MRIs via care everywhere that showed right ACA infarct probably because of severe stenosis of the right ACA and diffuse intracerebral atherosclerosis. He was started on dual antibiotic therapy and high-dose statin along with medications to control his blood pressures and discharged to rehabilitation. According to his wife, he had been doing well in terms of participating with therapy in the rehabilitation facility up until this morning when this change happened. There was no report of seizure-like activity or loss of consciousness. There was no report of any worsening weakness. No report of preceding fevers chills or illnesses.   Patient was unable to provide any history reliably. History was obtained from his wife.  LKW: Before noon today Premorbid modified Rankin scale (mRS): 5  Patient was not administered IV t-PA secondary to recent stroke. He was admitted to General Neurology for further evaluation and treatment.   SUBJECTIVE (INTERVAL HISTORY) His family is not at bedside.  The patient just finished TEE and is sedated. TEE showed no cardiac source of embolism or PFO.  OBJECTIVE Temp:  [97.6 F (36.4 C)-99.7 F (37.6 C)] 97.6 F (36.4 C) (07/27 1159) Pulse Rate:  [64-84] 73 (07/27 1210) Cardiac Rhythm: Normal  sinus rhythm (07/27 1050) Resp:  [11-18] 16 (07/27 1210) BP: (121-207)/(72-100) 182/76 (07/27 1210) SpO2:  [95 %-100 %] 99 % (07/27 1210)  CBC:   Recent Labs Lab 02/08/17 1634 02/08/17 1646 02/10/17 0409  WBC 7.1  --  5.2  NEUTROABS 4.7  --   --   HGB 12.9* 13.3 11.5*  HCT 36.5* 39.0 34.6*  MCV 79.2  --  79.4  PLT 320  --  296    Basic Metabolic Panel:   Recent Labs Lab 02/08/17 1634 02/08/17 1646 02/10/17 0409  NA 136 136 134*  K 4.5 4.5 3.8  CL 100* 97* 100*  CO2 29  --  25  GLUCOSE 176* 174* 98  BUN 20 23* 20  CREATININE 1.59* 1.50* 1.60*  CALCIUM 8.9  --  8.7*    Lipid Panel:     Component Value Date/Time   CHOL 138 02/09/2017 0653   TRIG 128 02/09/2017 0653   HDL 39 (L) 02/09/2017 0653   CHOLHDL 3.5 02/09/2017 0653   VLDL 26 02/09/2017 0653   LDLCALC 73 02/09/2017 0653   HgbA1c:  Lab Results  Component Value Date   HGBA1C 11.2 01/21/2017   Urine Drug Screen: No results found for: LABOPIA, COCAINSCRNUR, LABBENZ, AMPHETMU, THCU, LABBARB  Alcohol Level No results found for: ETH  IMAGING  Ct Angio Head W Or Wo Contrast Ct Angio Neck W And/or Wo Contrast 02/08/2017 IMPRESSION: 1. Negative CTA for emergent large vessel occlusion. 2. Bilateral severe V4 stenoses as above. 3. Multifocal moderate to severe atheromatous is stenoses involving the bilateral A2 and P2 segments as above, most severe within  the right PCA. 4. Negative CTA of the neck without flow-limiting stenosis or significant atheromatous disease.    Dg Chest 2 View 02/08/2017 IMPRESSION: No active cardiopulmonary disease.  Mr Brain Wo Contrast 02/09/2017 IMPRESSION: 1. Subcentimeter acute infarction within right anterior thalamus. No acute hemorrhage. 2. Right ACA distribution subacute infarction. Mild local mass effect. No herniation. 3. Background of chronic microvascular ischemic changes and parenchymal volume loss of the brain, advanced for age. 4. Chronic right occipital infarction. 5.  Paranasal sinus disease with a right middle meatus obstructive pattern, direct visualization recommended.  Ct Head Code Stroke W/o Cm 02/08/2017 IMPRESSION: 1. Subacute to early chronic right ACA territory infarcts without hemorrhage. There is some volume loss. 2. More remote infarct in the inferior medial right occipital lobe. 3. Remote infarct of the right internal capsule. 4. No acute infarct. 5. Extensive anterior right-sided sinus disease 6. ASPECTS is 10/10  TEE : normal    PHYSICAL EXAM Pleasant middle-aged African-American male currently not in distress. . Afebrile. Head is nontraumatic. Neck is supple without bruit.    Cardiac exam no murmur or gallop. Lungs are clear to auscultation. Distal pulses are well felt. Neurological Exam :  Drowsy due to sedation for TEE. Able to follow simple midline and one-step commands. Extraocular movements are full range without nystagmus. Right lower facial weakness. Tongue midline. Left hemiplegia with 2/5 left upper extremity proximally in 1/5 distally. Left lower extremity grade 3-4/5  . Tone is diminished on left side. Sensation appears diminished in the left compared to the right. Deep tendon reflexes are symmetric. Plantars are downgoing. ASSESSMENT/PLAN Frank Moss is a 46 y.o. 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 He did not receive IV t-PA due to recent stroke.   Stroke: Subcentimeter acute infarction within right anterior thalamus, likely embolic in the setting of multiple prior infarcts in multiple distributions in the setting of severe A2 and P2 segment atheromatous stenosis, advanced small vessel disease, and poorly controlled  diabetes.  Resultant  Dysarthria and left hemiparesis  CT head:  No acute infarct  MRI head: Subcentimeter acute infarction within right anterior thalamus and subacute right anterior cerebral artery distribution infarct. Chronic right occipital infarct CTangio head   Multifocal moderate to severe atheromatous is stenoses involving the bilateral A2 and P2 segments as above, most severe within the  right PCA. CT angio neck : no flow-limiting stenosis or  significant atheromatous disease.  2D Echo   not ordered  LDL 73  HgbA1c 11.2  SCDs for VTE prophylaxis    aspirin 325 mg daily and clopidogrel 75 mg daily prior to admission, now on aspirin 325 mg daily and clopidogrel 75 mg daily  Patient counseled to be compliant with his antithrombotic medications  Ongoing aggressive stroke risk factor management  Therapy recommendations: SNF;Supervision/Assistance - 24 hour    Disposition:   pending  Hypertension  Stable  Permissive hypertension (OK if < 220/120) but gradually normalize in 5-7 days  Long-term BP goal normotensive  Hyperlipidemia  Home meds:  Atorvastatin 80mg  PO, resumed in hospital  LDL 73, goal < 70  Continue statin at discharge  Diabetes  HgbA1c 11.2, goal < 7.0  Uncontrolled  Other Stroke Risk Factors  Borderline obesity, Body mass index is  29.36 kg/m., recommend weight loss, diet and exercise as appropriate   Hx stroke/TIA  Other Active Problems  None  Hospital day # 2  I have personally examined this patient, reviewed notes, independently viewed imaging studies, participated in medical decision making and plan of care.ROS completed by me personally and pertinent positives fully documented  I have made any additions or clarifications directly to the above note.  He presented with increasing swelling difficulties yesterday without significant worsening of his left hemiparesis as per his wife. I have reviewed his electronic medical records for  [facility]. He was found to have a right ACA stroke with atherosclerotic changes described in the A2 segment of the anterior cerebral artery and posterior cerebral artery in the P2 segments. However there is no significant atherosclerosis noted in the aortic arch and carotid bifurcations or ICA terminus.  TEE was unremarkable. prolonged cardiac monitoring for A. fib which I think is necessary. Cardiology prefers 30 day external heart monitor over loop recorder. I had a long discussion with the patient and Dr Darnelle Catalan at the bedside and answered questions. Continue aspirin and Plavix for stroke prevention for now.  . Continue therapy consults.  Follow-up in the stroke clinic in 6 weeks. Stroke team will sign off. Kindly call for questions.  Delia Heady, MD Medical Director [facility] Pager: [phone] 02/11/2017 12:54 PM   To contact Stroke Continuity provider, please refer to [email or web address]. After hours, contact General Neurology

## 2017-02-11 NOTE — Progress Notes (Signed)
SLP Cancellation Note  Patient Details Name: Frank Moss MRN: [name] DOB: 1970/11/25   Cancelled treatment:       Reason Eval/Treat Not Completed: Medical issues which prohibited therapy; pt out of surgery and having post-anesthesia lethargy; wife requested to wait until a.m. And/or he was transferred to CIR.   Tressie StalkerPat Adams, M.S., CCC-SLP 02/11/2017, 3:47 PM

## 2017-02-11 NOTE — Anesthesia Preprocedure Evaluation (Addendum)
Anesthesia Evaluation  Patient identified by MRN, date of birth, ID band Patient awake    Reviewed: Allergy & Precautions, Patient's Chart, lab work & pertinent test results  Airway Mallampati: III       Dental  (+) Dental Advisory Given   Pulmonary    Pulmonary exam normal        Cardiovascular hypertension, Pt. on medications + CAD  Normal cardiovascular exam Rhythm:Regular Rate:Normal     Neuro/Psych CVA, Residual Symptoms    GI/Hepatic   Endo/Other  diabetes  Renal/GU Renal InsufficiencyRenal disease     Musculoskeletal   Abdominal   Peds  Hematology   Anesthesia Other Findings   Reproductive/Obstetrics                           Anesthesia Physical Anesthesia Plan  ASA: III  Anesthesia Plan: MAC   Post-op Pain Management:    Induction: Intravenous  PONV Risk Score and Plan:   Airway Management Planned:   Additional Equipment:   Intra-op Plan:   Post-operative Plan:   Informed Consent: I have reviewed the patients History and Physical, chart, labs and discussed the procedure including the risks, benefits and alternatives for the proposed anesthesia with the patient or authorized representative who has indicated his/her understanding and acceptance.   Dental advisory given  Plan Discussed with: CRNA and Anesthesiologist  Anesthesia Plan Comments:         Anesthesia Quick Evaluation

## 2017-02-11 NOTE — Care Management Note (Signed)
Case Management Note  Patient Details  Name: Frank Moss MRN: [id number] Date of Birth: [birth year]  Subjective/Objective:                    Action/Plan: Pt discharging to CIR today. No further needs per CM.   Expected Discharge Date:  02/11/17               Expected Discharge Plan:  IP Rehab Facility  In-House Referral:     Discharge planning Services     Post Acute Care Choice:    Choice offered to:     DME Arranged:    DME Agency:     HH Arranged:    HH Agency:     Status of Service:  Completed, signed off  If discussed at [employer] Meetings, dates discussed:    Additional Comments:  Kermit BaloKelli F Nazifa Trinka, RN 02/11/2017, 2:17 PM

## 2017-02-11 NOTE — CV Procedure (Signed)
    PROCEDURE NOTE:  Procedure:  Transesophageal echocardiogram Operator:  Armanda Magicraci Draedyn Weidinger, MD Indications:  CVA Complications: None  During this procedure the patient is administered a total of Propofol 230mg  to achieve and maintain sedation.  The patient's heart rate, blood pressure, and oxygen saturation are monitored continuously during the procedure.   Results: Normal LV size and function Normal RV size and function Normal RA Normal LA and normal LA appendage.  No evidence of thrombus. Normal TV Normal PV Normal MV Trileaflet AV with mild AV sclerosis with no stenosis Normal interatrial septum with no evidence of shunt by colorflow dopper  Normal thoracic and ascending aorta. Trivial pericardial effusion No vegetation or thrombus noted.   The patient tolerated the procedure well and was transferred back to their room in stable condition.  Signed: Armanda Magicraci Rudransh Bellanca, MD [facility]

## 2017-02-11 NOTE — Interval H&P Note (Signed)
History and Physical Interval Note:  02/11/2017 10:40 AM  Frank Moss  has presented today for surgery, with the diagnosis of stroke  The various methods of treatment have been discussed with the patient and family. After consideration of risks, benefits and other options for treatment, the patient has consented to  Procedure(s): TRANSESOPHAGEAL ECHOCARDIOGRAM (TEE) (N/A) as a surgical intervention .  The patient's history has been reviewed, patient examined, no change in status, stable for surgery.  I have reviewed the patient's chart and labs.  Questions were answered to the patient's satisfaction.     Armanda Magicraci Turner

## 2017-02-11 NOTE — H&P (Signed)
Physical Medicine and Rehabilitation Admission H&P    Chief Complaint  Patient presents with  .     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 He was admitted to [facility] and started on aspirin and Plavix for his stroke prophylaxis. Hemoglobin A1c was 11.2, LDL- 65, and triglycerides-  298. During that hospitalization, he had evidence of depression as well as urinary retention and a Foley catheter was placed. He was discharged to a skilled nursing facility for rehab. While at [facility] , patient had physical, occupational and speech therapy. On 02/08/2017, he had worsening of swallow function and was not able to swallow food or medications--no abnormal seizure type activity reported.  He was sent to [facility] ED and admitted after MRI demonstrated small acute infarct in the right anterior thalamus, a subacute right ACA infarct, chronic microvascular ischemic changes advanced for age and chronic right occipital infarct.  Cardiology evaluated the patient, recommended TEE. This was performed 7/27 and revealed normal LVF, no shunt, no thrombus or vegetation and trivial pericardial effusion. Stroke felt to be embolic due to severe A2 and P2 segment atheromatous stenosis, advanced SVD and poorly controlled diabetes. Cardiology recommending  30 day heart monitor over loop recorder. Therapy evaluation revealed need for Clarise Cruz Plus to maximum assist level needed for stand pivot transfers, bed mobility was mod assist X2 and occupational therapy and demonstrated max assist X 2 for toileting. He also demonstrated slow processing with decreased attention and no s/s of aspiration seen therefore on regular diet with thin liquids.    Review of Systems  HENT: Negative for hearing loss and tinnitus.   Eyes: Negative for blurred vision and double vision.  Respiratory:  Negative for cough and shortness of breath.   Cardiovascular: Negative for chest pain, palpitations and leg swelling.  Gastrointestinal: Negative for heartburn, nausea and vomiting.  Genitourinary: Negative for dysuria and urgency.  Musculoskeletal: Negative for back pain, joint pain and myalgias.  Skin: Negative for itching and rash.  Neurological: Positive for speech change, focal weakness and weakness. Negative for dizziness and headaches.      Past Medical History:  Diagnosis Date  . Abnormal ECG   . Acute ischemic right ACA stroke ([place])   . Coronary artery disease, occlusive   . Dysfunction of eustachian tube   . History of right ACA stroke   . Hyperlipidemia associated with type 2 diabetes mellitus ([place])   . Hypertension   . Impotence of organic origin   . Lumbago   . Lymphadenitis    Unspecified, except mesenteric  . Type 2 diabetes, uncontrolled, with retinopathy ([place])   . Urinary retention 01/2017    History reviewed. No pertinent surgical history.    Family History  Problem Relation Age of Onset  . Hypertension Mother   . Depression Mother   . Cancer Neg Hx   . Diabetes Neg Hx   . Stroke Neg Hx     Social History:  reports that he has never smoked. He has never used smokeless tobacco. He reports that he does not drink alcohol or use drugs.    Allergies:  Allergies  Allergen Reactions  . Beta Adrenergic Blockers Other (See Comments)    "Fatigue," but this isn't noted on the patient's MAR   Medications Prior to Admission  Medication Sig Dispense Refill  . aspirin 325 MG tablet Take  325 mg by mouth daily.    Marland Kitchen atorvastatin (LIPITOR) 80 MG tablet Take 80 mg by mouth at bedtime.     . bisacodyl (DULCOLAX) 10 MG suppository Place 10 mg rectally once as needed (FOR CONSTIPATION IF NO RELIEF FROM MILK OF MAGNESIA).    . cloNIDine (CATAPRES) 0.2 MG tablet Take 0.2 mg by mouth 3 (three) times daily.    . clopidogrel (PLAVIX) 75 MG tablet Take 75 mg by mouth  daily.    Marland Kitchen diltiazem (CARDIZEM) 90 MG tablet Take 90 mg by mouth every 6 (six) hours.    Marland Kitchen escitalopram (LEXAPRO) 10 MG tablet Take 10 mg by mouth daily.    . insulin glargine (LANTUS) 100 UNIT/ML injection Inject 15 Units into the skin at bedtime.    . insulin lispro (HUMALOG) 100 UNIT/ML injection Inject 5 Units into the skin See admin instructions. THREE TIMES A DAY BEFORE EACH MEAL IF BGL >180 MG/DL    . magnesium hydroxide (MILK OF MAGNESIA) 400 MG/5ML suspension Take 30 mLs by mouth once as needed (FOR CONSTIPATION).    Marland Kitchen metFORMIN (GLUCOPHAGE) 1000 MG tablet Take 1,000 mg by mouth daily.    . nitroGLYCERIN (NITROSTAT) 0.4 MG SL tablet Place 0.4 mg under the tongue every 5 (five) minutes x 3 doses as needed for chest pain.     Marland Kitchen ondansetron (ZOFRAN) 4 MG tablet Take 4 mg by mouth every 4 (four) hours as needed for nausea or vomiting.    . Sodium Phosphates (RA SALINE ENEMA) 19-7 GM/118ML ENEM Place 1 enema rectally once as needed (FOR CONSTIPATION NOT RELIEVED BY DULCOLAX SUPPOSITORY AND CALL MD IF NO RELIEF FROM ENEMA).      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)  Functional Status:  Mobility: Bed Mobility Overal bed mobility: Needs Assistance Bed Mobility: Rolling, Sidelying to Sit Rolling: Mod assist, +2 for safety/equipment Sidelying to sit: Mod assist, +2 for physical assistance General bed mobility comments: Pt required multimodal cueing for sequencing, and mod +2 assist for rolling and trunk elevation Transfers Overall transfer  level: Needs assistance Equipment used: 2 person hand held assist Transfers: Sit to/from Stand, Stand Pivot Transfers Sit to Stand: Mod assist, +2 physical assistance Stand pivot transfers: Max assist, +2 physical assistance, +2 safety/equipment General transfer comment: sit<>stand x2 from bed with blocking out of LLE and vc to bring body weight to the right. multimodal cues during stand pivot with max A +2 to the left, Pt with heavy lean to the left during transfers      ADL: ADL Overall ADL's : Needs assistance/impaired Eating/Feeding: NPO Grooming: Wash/dry hands, Minimal assistance, Sitting Grooming Details (indicate cue type and reason): mod A to sit EOB and perform grooming as Pt requires assist from RUE for seated balance Upper Body Bathing: Maximal assistance Lower Body Bathing: Maximal assistance Upper Body Dressing : Maximal assistance Lower Body Dressing: Maximal assistance Toilet Transfer: Maximal assistance, +2 for physical assistance, +2 for safety/equipment, Stand-pivot, BSC Toilet Transfer Details (indicate cue type and reason): simulated through recliner transfer. Pt has foley Toileting- Clothing Manipulation and Hygiene: Total assistance Functional mobility during  ADLs: Maximal assistance, +2 for physical assistance, +2 for safety/equipment, Cueing for safety, Cueing for sequencing  Cognition: Cognition Overall Cognitive Status: Impaired/Different from baseline Orientation Level: Oriented X4 Cognition Arousal/Alertness: Awake/alert Behavior During Therapy: Flat affect Overall Cognitive Status: Impaired/Different from baseline Area of Impairment: Attention, Following commands, Safety/judgement, Awareness Current Attention Level: Sustained Following Commands: Follows one step commands inconsistently, Follows one step commands with increased time Safety/Judgement: Decreased awareness of safety, Decreased awareness of deficits Awareness: Intellectual  Physical  Exam: Blood pressure (!) 182/76, pulse 73, temperature 97.6 F (36.4 C), temperature source Oral, resp. rate 16, height 6' 2" (1.88 m), weight 103.7 kg (228 lb 11.2 oz), SpO2 99 %. Physical Exam  Nursing note and vitals reviewed. Constitutional: He is oriented to person, place, and time. He appears well-developed and well-nourished.  HENT:  Head: Normocephalic and atraumatic.  Mouth/Throat: Oropharynx is clear and moist.  Eyes: Pupils are equal, round, and reactive to light. Conjunctivae are normal.  Neck: Normal range of motion. Neck supple.  Cardiovascular: Regular rhythm and intact distal pulses.   Respiratory: Effort normal and breath sounds normal. No stridor.  GI: Soft. Bowel sounds are normal. He exhibits no distension. There is no tenderness.  Neurological: He is alert and oriented to person, place, and time.  Left facial weakness with left gaze preference and right neglect. Able to turn eyes to right field with moderate cues and few beats horizontal nystagmus to lateral field.  Delayed processing with slow speech. Spastic left hemiparesis noted with 1-2 beats clonus. His attention is poor and he lacks awareness/insight into deficits. Still slow to process after TEE earlier today.   Skin: Skin is warm and dry.  Psychiatric: His affect is blunt. His speech is delayed. He is slowed. He expresses inappropriate judgment. He is inattentive.    Results for orders placed or performed during the hospital encounter of 02/08/17 (from the past 48 hour(s))  Glucose, capillary     Status: Abnormal   Collection Time: 02/09/17  5:04 PM  Result Value Ref Range   Glucose-Capillary 120 (H) 65 - 99 mg/dL  Glucose, capillary     Status: Abnormal   Collection Time: 02/09/17  9:55 PM  Result Value Ref Range   Glucose-Capillary 124 (H) 65 - 99 mg/dL   Comment 1 Notify RN    Comment 2 Document in Chart   CBC     Status: Abnormal   Collection Time: 02/10/17  4:09 AM  Result Value Ref Range   WBC 5.2  4.0 - 10.5 K/uL   RBC 4.36 4.22 - 5.81 MIL/uL   Hemoglobin 11.5 (L) 13.0 - 17.0 g/dL   HCT 34.6 (L) 39.0 - 52.0 %   MCV 79.4 78.0 - 100.0 fL   MCH 26.4 26.0 - 34.0 pg   MCHC 33.2 30.0 - 36.0 g/dL   RDW 13.7 11.5 - 15.5 %   Platelets 296 150 - 400 K/uL  Basic metabolic panel     Status: Abnormal   Collection Time: 02/10/17  4:09 AM  Result Value Ref Range   Sodium 134 (L) 135 - 145 mmol/L   Potassium 3.8 3.5 - 5.1 mmol/L   Chloride 100 (L) 101 - 111 mmol/L   CO2 25 22 - 32 mmol/L   Glucose, Bld 98 65 - 99 mg/dL   BUN 20 6 - 20 mg/dL   Creatinine, Ser 1.60 (H) 0.61 - 1.24 mg/dL   Calcium 8.7 (L) 8.9 - 10.3 mg/dL   GFR calc non Af  Amer 49 (L) >60 mL/min   GFR calc Af Amer 57 (L) >60 mL/min    Comment: (NOTE) The eGFR has been calculated using the CKD EPI equation. This calculation has not been validated in all clinical situations. eGFR's persistently <60 mL/min signify possible Chronic Kidney Disease.    Anion gap 9 5 - 15  Glucose, capillary     Status: Abnormal   Collection Time: 02/10/17  6:42 AM  Result Value Ref Range   Glucose-Capillary 102 (H) 65 - 99 mg/dL   Comment 1 Notify RN    Comment 2 Document in Chart   Glucose, capillary     Status: Abnormal   Collection Time: 02/10/17 11:26 AM  Result Value Ref Range   Glucose-Capillary 161 (H) 65 - 99 mg/dL  Glucose, capillary     Status: Abnormal   Collection Time: 02/10/17  4:59 PM  Result Value Ref Range   Glucose-Capillary 176 (H) 65 - 99 mg/dL  Glucose, capillary     Status: Abnormal   Collection Time: 02/10/17 10:07 PM  Result Value Ref Range   Glucose-Capillary 145 (H) 65 - 99 mg/dL   Comment 1 Notify RN    Comment 2 Document in Chart   Glucose, capillary     Status: Abnormal   Collection Time: 02/11/17  6:36 AM  Result Value Ref Range   Glucose-Capillary 115 (H) 65 - 99 mg/dL   Comment 1 Notify RN    Comment 2 Document in Chart    No results found.     Medical Problem List and Plan: 1.  Left  hemiparesis, hemisensory loss,  and cognitive deficits secondary to right ACA infarct, small right thalamic infarct  -admit to inpatient rehab 2.  DVT Prophylaxis/Anticoagulation: Pharmaceutical: Lovenox 3. H/o lumbago/Pain Management: Denies pain at this moment. Will use tylenol prn 4. Mood: LCSW to follow for evaluation and support.  5. Neuropsych: This patient is not capable of making decisions on his  own behalf. 6. Skin/Wound Care: routine pressure relief measures 7. Fluids/Electrolytes/Nutrition: Monitor I/O--offer supplements if intake poor.  Check lytes in am.  8.T2DM: monitor BS ac/hs. Continue lantus daily with meal coverage. Will use SSI for elevated BS.  9. HTN:  Monitor BP bid--on norvasc 72m daily,  10 H/o urinary retention: currently incontinent. Toilet patient every 4 hours and cath if retention noted.   11. Hyperlipidemia: On atorvastatin.  12. Question CKD: baseline SCr around 1.6 per records review. Continue to monitor.  Offer fluids between meals for adequate hydration.    Post Admission Physician Evaluation: 1. Functional deficits secondary  to Right ACA,thalamic infarcts. 2. Patient is admitted to receive collaborative, interdisciplinary care between the physiatrist, rehab nursing staff, and therapy team. 3. Patient's level of medical complexity and substantial therapy needs in context of that medical necessity cannot be provided at a lesser intensity of care such as a SNF. 4. Patient has experienced substantial functional loss from his/her baseline which was documented above under the "Functional History" and "Functional Status" headings.  Judging by the patient's diagnosis, physical exam, and functional history, the patient has potential for functional progress which will result in measurable gains while on inpatient rehab.  These gains will be of substantial and practical use upon discharge  in facilitating mobility and self-care at the household level. 5. Physiatrist  will provide 24 hour management of medical needs as well as oversight of the therapy plan/treatment and provide guidance as appropriate regarding the interaction of the two. 6. The Preadmission Screening  has been reviewed and patient status is unchanged unless otherwise stated above. 7. 24 hour rehab nursing will assist with bladder management, bowel management, safety, skin/wound care, disease management, medication administration, pain management and patient education  and help integrate therapy concepts, techniques,education, etc. 8. PT will assess and treat for/with: Lower extremity strength, range of motion, stamina, balance, functional mobility, safety, adaptive techniques and equipment, NMR, visual-spatial awareness, cognitive-behavioral, family ed.   Goals are: min assist. 9. OT will assess and treat for/with: ADL's, functional mobility, safety, upper extremity strength, adaptive techniques and equipment, NMR, family ed, visual-spatial awareness, cognition.   Goals are: min assist. Therapy may proceed with showering this patient. 10. SLP will assess and treat for/with: cognition, swallowing, family ed.  Goals are: mod I to supervision. 11. Case Management and Social Worker will assess and treat for psychological issues and discharge planning. 12. Team conference will be held weekly to assess progress toward goals and to determine barriers to discharge. 13. Patient will receive at least 3 hours of therapy per day at least 5 days per week. 14. ELOS: 22-26 days       15. Prognosis:  excellent     Meredith Staggers, MD, [place] [facility] 02/11/2017  Bary Leriche, Hershal Coria 02/11/2017

## 2017-02-11 NOTE — Anesthesia Postprocedure Evaluation (Signed)
Anesthesia Post Note  Patient: Frank Moss  Procedure(s) Performed: Procedure(s) (LRB): TRANSESOPHAGEAL ECHOCARDIOGRAM (TEE) (N/A)     Patient location during evaluation: PACU Anesthesia Type: MAC Level of consciousness: awake and alert Pain management: pain level controlled Vital Signs Assessment: post-procedure vital signs reviewed and stable Respiratory status: spontaneous breathing, nonlabored ventilation, respiratory function stable and patient connected to nasal cannula oxygen Cardiovascular status: stable and blood pressure returned to baseline Anesthetic complications: no    Last Vitals:  Vitals:   02/11/17 1159 02/11/17 1210  BP: (!) 154/72 (!) 182/76  Pulse: 75 73  Resp: 17 16  Temp: 36.4 C     Last Pain:  Vitals:   02/11/17 1159  TempSrc: Oral  PainSc:                  [name]

## 2017-02-11 NOTE — H&P (Signed)
Physical Medicine and Rehabilitation Admission H&P       Chief Complaint  Patient presents with  .     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 He was admitted to [facility] and started on aspirin and Plavix for his stroke prophylaxis. Hemoglobin A1c was 11.2, LDL- 65, and triglycerides-  298. During that hospitalization, he had evidence of depression as well as urinary retention and a Foley catheter was placed. He was discharged to a skilled nursing facility for rehab. While at [facility] , patient had physical, occupational and speech therapy. On 02/08/2017, he had worsening of swallow function and was not able to swallow food or medications--no abnormal seizure type activity reported. He was sent to [facility] ED and admitted after MRI demonstrated small acute infarct in the right anterior thalamus, a subacute right ACA infarct, chronic microvascular ischemic changes advanced for age and chronic right occipital infarct.  Cardiology evaluated the patient, recommended TEE. This was performed 7/27 and revealed normal LVF, no shunt, no thrombus or vegetation and trivial pericardial effusion. Stroke felt to be embolic due to severe A2 and P2 segment atheromatous stenosis, advanced SVD and poorly controlled diabetes. Cardiology recommending  30 day heart monitor over loop recorder. Therapy evaluation revealed need for Clarise Cruz Plus to maximum assist level needed for stand pivot transfers, bed mobility was mod assist X2 and occupational therapy and demonstrated max assist X 2 for toileting. He also demonstrated slow processing with decreased attention and no s/s of aspiration seen therefore on regular diet with thin liquids.    Review of Systems  HENT: Negative for hearing loss and tinnitus.   Eyes: Negative for blurred vision and double vision.    Respiratory: Negative for cough and shortness of breath.   Cardiovascular: Negative for chest pain, palpitations and leg swelling.  Gastrointestinal: Negative for heartburn, nausea and vomiting.  Genitourinary: Negative for dysuria and urgency.  Musculoskeletal: Negative for back pain, joint pain and myalgias.  Skin: Negative for itching and rash.  Neurological: Positive for speech change, focal weakness and weakness. Negative for dizziness and headaches.          Past Medical History:  Diagnosis Date  . Abnormal ECG   . Acute ischemic right ACA stroke ([place])   . Coronary artery disease, occlusive   . Dysfunction of eustachian tube   . History of right ACA stroke   . Hyperlipidemia associated with type 2 diabetes mellitus ([place])   . Hypertension   . Impotence of organic origin   . Lumbago   . Lymphadenitis    Unspecified, except mesenteric  . Type 2 diabetes, uncontrolled, with retinopathy ([place])   . Urinary retention 01/2017    History reviewed. No pertinent surgical history.         Family History  Problem Relation Age of Onset  . Hypertension Mother   . Depression Mother   . Cancer Neg Hx   . Diabetes Neg Hx   . Stroke Neg Hx     Social History:  reports that he has never smoked. He has never used smokeless tobacco. He reports that he does not drink alcohol or use drugs.    Allergies:       Allergies  Allergen Reactions  . Beta Adrenergic Blockers Other (See Comments)    "Fatigue," but this isn't noted on the patient's [facility]  Medications Prior to Admission  Medication Sig Dispense Refill  . aspirin 325 MG tablet Take 325 mg by mouth daily.    Marland Kitchen atorvastatin (LIPITOR) 80 MG tablet Take 80 mg by mouth at bedtime.     . bisacodyl (DULCOLAX) 10 MG suppository Place 10 mg rectally once as needed (FOR CONSTIPATION IF NO RELIEF FROM MILK OF MAGNESIA).    . cloNIDine (CATAPRES) 0.2 MG tablet Take 0.2 mg by mouth 3 (three)  times daily.    . clopidogrel (PLAVIX) 75 MG tablet Take 75 mg by mouth daily.    Marland Kitchen diltiazem (CARDIZEM) 90 MG tablet Take 90 mg by mouth every 6 (six) hours.    Marland Kitchen escitalopram (LEXAPRO) 10 MG tablet Take 10 mg by mouth daily.    . insulin glargine (LANTUS) 100 UNIT/ML injection Inject 15 Units into the skin at bedtime.    . insulin lispro (HUMALOG) 100 UNIT/ML injection Inject 5 Units into the skin See admin instructions. THREE TIMES A DAY BEFORE EACH MEAL IF BGL >180 MG/DL    . magnesium hydroxide (MILK OF MAGNESIA) 400 MG/5ML suspension Take 30 mLs by mouth once as needed (FOR CONSTIPATION).    Marland Kitchen metFORMIN (GLUCOPHAGE) 1000 MG tablet Take 1,000 mg by mouth daily.    . nitroGLYCERIN (NITROSTAT) 0.4 MG SL tablet Place 0.4 mg under the tongue every 5 (five) minutes x 3 doses as needed for chest pain.     Marland Kitchen ondansetron (ZOFRAN) 4 MG tablet Take 4 mg by mouth every 4 (four) hours as needed for nausea or vomiting.    . Sodium Phosphates (RA SALINE ENEMA) 19-7 GM/118ML ENEM Place 1 enema rectally once as needed (FOR CONSTIPATION NOT RELIEVED BY DULCOLAX SUPPOSITORY AND CALL MD IF NO RELIEF FROM ENEMA).      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[employer])  Functional Status:  Mobility: Bed Mobility Overal bed mobility: Needs Assistance Bed Mobility: Rolling, Sidelying to Sit Rolling: Mod assist, +2 for safety/equipment Sidelying to sit: Mod assist, +2 for physical assistance General bed mobility comments: Pt required multimodal cueing  for sequencing, and mod +2 assist for rolling and trunk elevation Transfers Overall transfer level: Needs assistance Equipment used: 2 person hand held assist Transfers: Sit to/from Stand, Stand Pivot Transfers Sit to Stand: Mod assist, +2 physical assistance Stand pivot transfers: Max assist, +2 physical assistance, +2 safety/equipment General transfer comment: sit<>stand x2 from bed with blocking out of LLE and vc to bring body weight to the right. multimodal cues during stand pivot with max A +2 to the left, Pt with heavy lean to the left during transfers  ADL: ADL Overall ADL's : Needs assistance/impaired Eating/Feeding: NPO Grooming: Wash/dry hands, Minimal assistance, Sitting Grooming Details (indicate cue type and reason): mod A to sit EOB and perform grooming as Pt requires assist from RUE for seated balance Upper Body Bathing: Maximal assistance Lower Body Bathing: Maximal assistance Upper Body Dressing : Maximal assistance Lower Body Dressing: Maximal assistance Toilet Transfer: Maximal assistance, +2 for physical assistance, +2 for safety/equipment, Stand-pivot, BSC Toilet Transfer Details (indicate cue type and reason): simulated through recliner transfer. Pt  has foley Toileting- Clothing Manipulation and Hygiene: Total assistance Functional mobility during ADLs: Maximal assistance, +2 for physical assistance, +2 for safety/equipment, Cueing for safety, Cueing for sequencing  Cognition: Cognition Overall Cognitive Status: Impaired/Different from baseline Orientation Level: Oriented X4 Cognition Arousal/Alertness: Awake/alert Behavior During Therapy: Flat affect Overall Cognitive Status: Impaired/Different from baseline Area of Impairment: Attention, Following commands, Safety/judgement, Awareness Current Attention Level: Sustained Following Commands: Follows one step commands inconsistently, Follows one step commands with increased time Safety/Judgement: Decreased  awareness of safety, Decreased awareness of deficits Awareness: Intellectual  Physical Exam: Blood pressure (!) 182/76, pulse 73, temperature 97.6 F (36.4 C), temperature source Oral, resp. rate 16, height _0  (1.88 m), weight 103.7 kg (228 lb 11.2 oz), SpO2 99 %. Physical Exam  Nursing note and vitals reviewed. Constitutional: He is oriented to person, place, and time. He appears well-developed and well-nourished.  HENT:  Head: Normocephalic and atraumatic.  Mouth/Throat: Oropharynx is clear and moist.  Eyes: Pupils are equal, round, and reactive to light. Conjunctivae are normal.  Neck: Normal range of motion. Neck supple.  Cardiovascular: Regular rhythm and intact distal pulses.   Respiratory: Effort normal and breath sounds normal. No stridor.  GI: Soft. Bowel sounds are normal. He exhibits no distension. There is no tenderness.  Neurological: He is alert and oriented to person, place, and time.  Left facial weakness with left gaze preference and right neglect. Able to turn eyes to right field with moderate cues and few beats horizontal nystagmus to lateral field.  Delayed processing with slow speech. Spastic left hemiparesis noted with 1-2 beats clonus. His attention is poor and he lacks awareness/insight into deficits. Still slow to process after TEE earlier today.   Skin: Skin is warm and dry.  Psychiatric: His affect is blunt. His speech is delayed. He is slowed. He expresses inappropriate judgment. He is inattentive.    Lab Results Last 48 Hours        Results for orders placed or performed during the hospital encounter of 02/08/17 (from the past 48 hour(s))  Glucose, capillary     Status: Abnormal   Collection Time: 02/09/17  5:04 PM  Result Value Ref Range   Glucose-Capillary 120 (H) 65 - 99 mg/dL  Glucose, capillary     Status: Abnormal   Collection Time: 02/09/17  9:55 PM  Result Value Ref Range   Glucose-Capillary 124 (H) 65 - 99 mg/dL   Comment 1 Notify RN     Comment 2 Document in Chart   CBC     Status: Abnormal   Collection Time: 02/10/17  4:09 AM  Result Value Ref Range   WBC 5.2 4.0 - 10.5 K/uL   RBC 4.36 4.22 - 5.81 MIL/uL   Hemoglobin 11.5 (L) 13.0 - 17.0 g/dL   HCT 34.6 (L) 39.0 - 52.0 %   MCV 79.4 78.0 - 100.0 fL   MCH 26.4 26.0 - 34.0 pg   MCHC 33.2 30.0 - 36.0 g/dL   RDW 13.7 11.5 - 15.5 %   Platelets 296 150 - 400 K/uL  Basic metabolic panel     Status: Abnormal   Collection Time: 02/10/17  4:09 AM  Result Value Ref Range   Sodium 134 (L) 135 - 145 mmol/L   Potassium 3.8 3.5 - 5.1 mmol/L   Chloride 100 (L) 101 - 111 mmol/L   CO2 25 22 - 32 mmol/L   Glucose, Bld 98 65 - 99 mg/dL   BUN 20 6 - 20 mg/dL  Creatinine, Ser 1.60 (H) 0.61 - 1.24 mg/dL   Calcium 8.7 (L) 8.9 - 10.3 mg/dL   GFR calc non Af Amer 49 (L) >60 mL/min   GFR calc Af Amer 57 (L) >60 mL/min    Comment: (NOTE) The eGFR has been calculated using the CKD EPI equation. This calculation has not been validated in all clinical situations. eGFR's persistently <60 mL/min signify possible Chronic Kidney Disease.    Anion gap 9 5 - 15  Glucose, capillary     Status: Abnormal   Collection Time: 02/10/17  6:42 AM  Result Value Ref Range   Glucose-Capillary 102 (H) 65 - 99 mg/dL   Comment 1 Notify RN    Comment 2 Document in Chart   Glucose, capillary     Status: Abnormal   Collection Time: 02/10/17 11:26 AM  Result Value Ref Range   Glucose-Capillary 161 (H) 65 - 99 mg/dL  Glucose, capillary     Status: Abnormal   Collection Time: 02/10/17  4:59 PM  Result Value Ref Range   Glucose-Capillary 176 (H) 65 - 99 mg/dL  Glucose, capillary     Status: Abnormal   Collection Time: 02/10/17 10:07 PM  Result Value Ref Range   Glucose-Capillary 145 (H) 65 - 99 mg/dL   Comment 1 Notify RN    Comment 2 Document in Chart   Glucose, capillary     Status: Abnormal   Collection Time: 02/11/17  6:36 AM  Result Value Ref Range    Glucose-Capillary 115 (H) 65 - 99 mg/dL   Comment 1 Notify RN    Comment 2 Document in Chart      Imaging Results (Last 48 hours)  No results found.       Medical Problem List and Plan: 1.  Left hemiparesis, hemisensory loss,  and cognitive deficits secondary to right ACA infarct, small right thalamic infarct             -admit to inpatient rehab 2.  DVT Prophylaxis/Anticoagulation: Pharmaceutical: Lovenox 3. H/o lumbago/Pain Management: Denies pain at this moment. Will use tylenol prn 4. Mood: LCSW to follow for evaluation and support.  5. Neuropsych: This patient is not capable of making decisions on his  own behalf. 6. Skin/Wound Care: routine pressure relief measures 7. Fluids/Electrolytes/Nutrition: Monitor I/O--offer supplements if intake poor.  Check lytes in am.  8.T2DM: monitor BS ac/hs. Continue lantus daily with meal coverage. Will use SSI for elevated BS.  9. HTN:  Monitor BP bid--on norvasc 31m daily,  10 H/o urinary retention: currently incontinent. Toilet patient every 4 hours and cath if retention noted.   11. Hyperlipidemia: On atorvastatin.  12. Question CKD: baseline SCr around 1.6 per records review. Continue to monitor.  Offer fluids between meals for adequate hydration.    Post Admission Physician Evaluation: 1. Functional deficits secondary  to Right ACA,thalamic infarcts. 2. Patient is admitted to receive collaborative, interdisciplinary care between the physiatrist, rehab nursing staff, and therapy team. 3. Patient's level of medical complexity and substantial therapy needs in context of that medical necessity cannot be provided at a lesser intensity of care such as a SNF. 4. Patient has experienced substantial functional loss from his/her baseline which was documented above under the "Functional History" and "Functional Status" headings.  Judging by the patient's diagnosis, physical exam, and functional history, the patient has potential for  functional progress which will result in measurable gains while on inpatient rehab.  These gains will be of substantial and practical use upon  discharge  in facilitating mobility and self-care at the household level. 5. Physiatrist will provide 24 hour management of medical needs as well as oversight of the therapy plan/treatment and provide guidance as appropriate regarding the interaction of the two. 6. The Preadmission Screening has been reviewed and patient status is unchanged unless otherwise stated above. 7. 24 hour rehab nursing will assist with bladder management, bowel management, safety, skin/wound care, disease management, medication administration, pain management and patient education  and help integrate therapy concepts, techniques,education, etc. 8. PT will assess and treat for/with: Lower extremity strength, range of motion, stamina, balance, functional mobility, safety, adaptive techniques and equipment, NMR, visual-spatial awareness, cognitive-behavioral, family ed.   Goals are: min assist. 9. OT will assess and treat for/with: ADL's, functional mobility, safety, upper extremity strength, adaptive techniques and equipment, NMR, family ed, visual-spatial awareness, cognition.   Goals are: min assist. Therapy may proceed with showering this patient. 10. SLP will assess and treat for/with: cognition, swallowing, family ed.  Goals are: mod I to supervision. 11. Case Management and Social Worker will assess and treat for psychological issues and discharge planning. 12. Team conference will be held weekly to assess progress toward goals and to determine barriers to discharge. 13. Patient will receive at least 3 hours of therapy per day at least 5 days per week. 14. ELOS: 22-26 days       15. Prognosis:  excellent     Meredith Staggers, MD, [facility] 02/11/2017  Bary Leriche, Hershal Coria 02/11/2017

## 2017-02-11 NOTE — Discharge Summary (Signed)
Physician Discharge Summary  Frank Moss ZOX:[id number] DOB: 25-Jan-1971 DOA: 02/08/2017  PCP: Pecola Lawless, MD  Admit date: 02/08/2017 Discharge date: 02/11/2017  Admitted From: SNF Discharge disposition: CIR   Recommendations for Outpatient Follow-Up:   1. Patient should follow-up in the stroke clinic in 6 weeks. 2. Follow-up transesophageal echocardiogram results. 3. Will need to follow-up with cardiology for a 30 day external heart monitor.   Discharge Diagnosis:   Principal Problem:   Dysphagia as late effect of stroke Active Problems:   DM (diabetes mellitus), type 2 with peripheral vascular complications (HCC)   Mixed hyperlipidemia   Accelerated hypertension   Acute ischemic right ACA stroke (HCC)   CKD (chronic kidney disease), stage III   Left hemiparesis (HCC)   Cognitive deficit due to recent stroke   Hemi-neglect of left side  Discharge Condition: Improved.  Diet recommendation: Low sodium, heart healthy.  Carbohydrate-modified.    History of Present Illness:   Frank Moss is an 46 y.o. male with a PMH of diabetes, hypertension, hyperlipidemia, CAD, chronic kidney disease, and recent ischemic right ACA stroke earlier this month for which he was sent to SNF for rehabilitation on 01/26/17, who returned 02/08/17 with a chief complaint of acute dysphasia. Repeat MRI showed a subcentimeter acute infarction within the right anterior thalamus.  Hospital Course by Problem:   Principal Problem:   Acute right anterior thalamus CVA resulting in dysphagia in the setting of significant cerebrovascular disease Previous MRI showed patchy right ACA infarcts and MRA showed multiple areas of intracranial stenosis. Repeat MRI showed a subcentimeter acute infarction in the right anterior thalamus. CT angiogram showed bilateral severe V4 stenosis and multifocal moderate to severe atheromatous stenoses involving the bilateral A2 and P2 segments with the most severe area  within the right PCA. Recent 2-D echo done for prior stroke evaluation. No need to repeat. No embolic source identified. Continue aspirin, Plavix. PT/OT/ST consultations. Neurologist recommended TEE/loop recorder placement, but due to concerns given his history of medical noncompliance, a 30 day event monitor was recommended by cardiology. If he is compliant with 30 day monitor, an implantable loop recorder will be considered. TEE Performed.Evaluated by CIR, and will be discharged to their program for further rehabilitation.  Active Problems:   DM (diabetes mellitus), type 2 with peripheral vascular complications (HCC) Recent hemoglobin A1c was 11.2%. Currently being managed with 15 units of Lantus and moderate scale SSI. CBGs well-controlled, 102-176.    Mixed hyperlipidemia Well-controlled on Lipitor 80 mg daily with recent LDL 73.    Accelerated hypertension Permissive hypertension in the acute phase. Blood pressure range 180-207/80-91. We will begin to lower blood pressure. Norvasc 5 mg ordered beginning today.    CKD (chronic kidney disease), stage III Stable.  HIV screening The patient falls between the ages of 13-64 and should be screened for HIV, therefore HIV testing done 02/08/17: Non- reactive.   Medical Consultants:    Cardiology  Neurology  Physical Rehabilitation   Discharge Exam:   Vitals:   02/11/17 1159 02/11/17 1210  BP: (!) 154/72 (!) 182/76  Pulse: 75 73  Resp: 17 16  Temp: 97.6 F (36.4 C)    Vitals:   02/11/17 1045 02/11/17 1050 02/11/17 1159 02/11/17 1210  BP: (!) 182/78 (!) 194/83 (!) 154/72 (!) 182/76  Pulse: 73 73 75 73  Resp: 17 18 17 16   Temp:   97.6 F (36.4 C)   TempSrc:   Oral   SpO2: 100% 100% 99% 99%  Weight:  Height:        General exam: Appears calm and comfortable.  Respiratory system: Clear to auscultation. Respiratory effort normal. Cardiovascular system: S1 & S2 Blomgren, RRR. No JVD,  rubs, gallops or clicks. II/VI  murmur. Gastrointestinal system: Abdomen is nondistended, soft and nontender. No organomegaly or masses felt. Normal bowel sounds Demery. Central nervous system: Alert. Dysarthric with left hemiparesis. Extremities: No clubbing,  or cyanosis. No edema. Skin: No rashes, lesions or ulcers. Psychiatry: Judgement and insight appear impaired. Mood & affect flat.    The results of significant diagnostics from this hospitalization (including imaging, microbiology, ancillary and laboratory) are listed below for reference.     Procedures and Diagnostic Studies:   Ct Angio Head W Or Wo Contrast  Result Date: 02/08/2017 CLINICAL DATA:  Initial evaluation for acute discs are. EXAM: CT ANGIOGRAPHY HEAD AND NECK TECHNIQUE: Multidetector CT imaging of the head and neck was performed using the standard protocol during bolus administration of intravenous contrast. Multiplanar CT image reconstructions and MIPs were obtained to evaluate the vascular anatomy. Carotid stenosis measurements (when applicable) are obtained utilizing NASCET criteria, using the distal internal carotid diameter as the denominator. CONTRAST:  50 cc of Isovue 370. COMPARISON:  Prior CT from earlier same day. FINDINGS: CTA NECK FINDINGS Aortic arch: Visualized aortic arch of normal caliber with normal 3 vessel morphology. No flow-limiting stenosis about the origin of the great vessels. Minimal plaque noted within the proximal descending intrathoracic aorta. Visualized subclavian artery is widely patent. Right carotid system: Right common and internal carotid artery's widely patent without stenosis, dissection, or occlusion. Left carotid system: Left common and internal carotid artery's widely patent without stenosis, dissection, or occlusion. Vertebral arteries: Both of the vertebral arteries arise from the subclavian arteries. Left vertebral artery slightly dominant. Vertebral arteries widely patent within the neck without stenosis, dissection, or  occlusion. Skeleton: No acute osseus abnormality. No worrisome lytic or blastic osseous lesions. Other neck: No acute soft tissue abnormality within the neck. Salivary glands normal. Thyroid normal. No adenopathy. Upper chest: Visualized upper chest within normal limits. Partially visualized lungs are clear. Review of the MIP images confirms the above findings CTA HEAD FINDINGS Anterior circulation: Petrous, cavernous, and supraclinoid segments of the internal carotid arteries are widely patent without flow-limiting stenosis. ICA termini widely patent. A1 segments patent bilaterally. Anterior communicating artery normal. Multifocal irregularity within the A2 segments bilaterally with moderate to severe stenoses (Series 7, image 75, 72), likely atheromatous. ACA is are patent to their distal aspects. M1 segments patent bilaterally without stenosis or occlusion MCA bifurcations normal. No proximal M2 occlusion. Distal MCA branches demonstrate atheromatous irregularity but are patent and symmetric bilaterally. Posterior circulation: Focal plaque with severe left V4 stenosis (series 8, image 123). Left vertebral artery patent distally to the vertebrobasilar junction. Focal severe stenosis within the diminutive right vertebral artery (series 8, image 123). Right vertebral artery otherwise patent to the vertebrobasilar junction. Posterior inferior cerebral arteries not well evaluated on this exam. Vertebrobasilar junction normal. Basilar artery widely patent to its distal aspect. Superior cerebral arteries patent bilaterally. Posterior cerebral arteries largely supplied via the basilar artery. Short-segment moderate mid left P2 stenosis (series 8, image 123). Left PCA irregular but patent 2 its distal aspects. Right PCA somewhat attenuated and irregular with moderate to severe multifocal stenoses involving the right P2 segment (series 8, image 128). Right PCA also patent to its distal aspect. Small left posterior  communicating artery noted. Venous sinuses: Not well evaluated on this exam due  to arterial bolus timing. Anatomic variants: None.  No aneurysm or vascular malformation. Delayed phase: No pathologic enhancement. Subacute to chronic right ACA territory infarcts again noted. Additional remote infarcts involving the right PCA territory. Remote right basal ganglia infarcts. Right maxillary sinus disease. Review of the MIP images confirms the above findings IMPRESSION: 1. Negative CTA for emergent large vessel occlusion. 2. Bilateral severe V4 stenoses as above. 3. Multifocal moderate to severe atheromatous is stenoses involving the bilateral A2 and P2 segments as above, most severe within the right PCA. 4. Negative CTA of the neck without flow-limiting stenosis or significant atheromatous disease. Electronically Signed   By: Rise Mu M.D.   On: 02/08/2017 20:36   Dg Chest 2 View  Result Date: 02/08/2017 CLINICAL DATA:  Recent TIA EXAM: CHEST  2 VIEW COMPARISON:  None. FINDINGS: Cardiac shadow is mildly prominent but accentuated by the frontal technique. No focal infiltrate or sizable effusion is seen. No bony abnormality is noted. IMPRESSION: No active cardiopulmonary disease. Electronically Signed   By: Alcide Clever M.D.   On: 02/08/2017 22:26   Ct Angio Neck W And/or Wo Contrast  Result Date: 02/08/2017 CLINICAL DATA:  Initial evaluation for acute discs are. EXAM: CT ANGIOGRAPHY HEAD AND NECK TECHNIQUE: Multidetector CT imaging of the head and neck was performed using the standard protocol during bolus administration of intravenous contrast. Multiplanar CT image reconstructions and MIPs were obtained to evaluate the vascular anatomy. Carotid stenosis measurements (when applicable) are obtained utilizing NASCET criteria, using the distal internal carotid diameter as the denominator. CONTRAST:  50 cc of Isovue 370. COMPARISON:  Prior CT from earlier same day. FINDINGS: CTA NECK FINDINGS Aortic arch:  Visualized aortic arch of normal caliber with normal 3 vessel morphology. No flow-limiting stenosis about the origin of the great vessels. Minimal plaque noted within the proximal descending intrathoracic aorta. Visualized subclavian artery is widely patent. Right carotid system: Right common and internal carotid artery's widely patent without stenosis, dissection, or occlusion. Left carotid system: Left common and internal carotid artery's widely patent without stenosis, dissection, or occlusion. Vertebral arteries: Both of the vertebral arteries arise from the subclavian arteries. Left vertebral artery slightly dominant. Vertebral arteries widely patent within the neck without stenosis, dissection, or occlusion. Skeleton: No acute osseus abnormality. No worrisome lytic or blastic osseous lesions. Other neck: No acute soft tissue abnormality within the neck. Salivary glands normal. Thyroid normal. No adenopathy. Upper chest: Visualized upper chest within normal limits. Partially visualized lungs are clear. Review of the MIP images confirms the above findings CTA HEAD FINDINGS Anterior circulation: Petrous, cavernous, and supraclinoid segments of the internal carotid arteries are widely patent without flow-limiting stenosis. ICA termini widely patent. A1 segments patent bilaterally. Anterior communicating artery normal. Multifocal irregularity within the A2 segments bilaterally with moderate to severe stenoses (Series 7, image 75, 72), likely atheromatous. ACA is are patent to their distal aspects. M1 segments patent bilaterally without stenosis or occlusion MCA bifurcations normal. No proximal M2 occlusion. Distal MCA branches demonstrate atheromatous irregularity but are patent and symmetric bilaterally. Posterior circulation: Focal plaque with severe left V4 stenosis (series 8, image 123). Left vertebral artery patent distally to the vertebrobasilar junction. Focal severe stenosis within the diminutive right  vertebral artery (series 8, image 123). Right vertebral artery otherwise patent to the vertebrobasilar junction. Posterior inferior cerebral arteries not well evaluated on this exam. Vertebrobasilar junction normal. Basilar artery widely patent to its distal aspect. Superior cerebral arteries patent bilaterally. Posterior cerebral arteries largely supplied  via the basilar artery. Short-segment moderate mid left P2 stenosis (series 8, image 123). Left PCA irregular but patent 2 its distal aspects. Right PCA somewhat attenuated and irregular with moderate to severe multifocal stenoses involving the right P2 segment (series 8, image 128). Right PCA also patent to its distal aspect. Small left posterior communicating artery noted. Venous sinuses: Not well evaluated on this exam due to arterial bolus timing. Anatomic variants: None.  No aneurysm or vascular malformation. Delayed phase: No pathologic enhancement. Subacute to chronic right ACA territory infarcts again noted. Additional remote infarcts involving the right PCA territory. Remote right basal ganglia infarcts. Right maxillary sinus disease. Review of the MIP images confirms the above findings IMPRESSION: 1. Negative CTA for emergent large vessel occlusion. 2. Bilateral severe V4 stenoses as above. 3. Multifocal moderate to severe atheromatous is stenoses involving the bilateral A2 and P2 segments as above, most severe within the right PCA. 4. Negative CTA of the neck without flow-limiting stenosis or significant atheromatous disease. Electronically Signed   By: Rise Mu M.D.   On: 02/08/2017 20:36   Mr Brain Wo Contrast  Result Date: 02/09/2017 CLINICAL DATA:  46 y/o M; acute dysphagia. Right ACA stroke earlier this month. EXAM: MRI HEAD WITHOUT CONTRAST TECHNIQUE: Multiplanar, multiecho pulse sequences of the brain and surrounding structures were obtained without intravenous contrast. COMPARISON:  02/08/2017 CT angiogram of the head. FINDINGS:  Brain: Right ACA distribution diffusion hyperintensity with few areas of mildly reduced diffusion, T2 FLAIR hyperintense signal abnormality, and local mass effect compatible with subacute infarction. Subcentimeter focus of clearly reduced diffusion within the right anterior thalamus compatible with acute infarction. Background of foci of T2 FLAIR hyperintense signal abnormality in white matter compatible with chronic microvascular ischemic changes, advanced for age and brain parenchymal volume loss. Chronic infarction within the right occipital lobe. No herniation or acute hemorrhage identified. Vascular: Normal flow voids. Skull and upper cervical spine: Normal marrow signal. Sinuses/Orbits: Right frontal, anterior ethmoid, and maxillary sinus opacification, right middle meatus obstructive pattern. No abnormal signal of mastoid air cells. Orbits are unremarkable. Other: None. IMPRESSION: 1. Subcentimeter acute infarction within right anterior thalamus. No acute hemorrhage. 2. Right ACA distribution subacute infarction. Mild local mass effect. No herniation. 3. Background of chronic microvascular ischemic changes and parenchymal volume loss of the brain, advanced for age. 4. Chronic right occipital infarction. 5. Paranasal sinus disease with a right middle meatus obstructive pattern, direct visualization recommended. These results will be called to the ordering clinician or representative by the Radiologist Assistant, and communication documented in the PACS or zVision Dashboard. Electronically Signed   By: Mitzi Hansen M.D.   On: 02/09/2017 06:36   Ct Head Code Stroke W/o Cm  Result Date: 02/08/2017 CLINICAL DATA:  Code stroke. New onset dysphagia. Previous ACA territory infarct. Left hemi paresis. EXAM: CT HEAD WITHOUT CONTRAST TECHNIQUE: Contiguous axial images were obtained from the base of the skull through the vertex without intravenous contrast. COMPARISON:  None. FINDINGS: Brain: A  subacute/early chronic right ACA territory infarct is noted. There is some volume loss. No associated hemorrhage is present. A more remote medial and inferior right occipital lobe infarct is present. Ischemic changes within the right internal capsule appear remote as well. No acute cortical infarct, hemorrhage, or mass lesion is present. The basal ganglia are otherwise intact. The insular ribbon is normal. No other focal cortical lesions are present. The ventricles are of normal size. No significant extra-axial fluid collection is present. Vascular: No hyperdense vessel  or unexpected calcification. Skull: The calvarium is intact. No focal lytic or blastic lesions are present. Sinuses/Orbits: The right maxillary sinus is opacified. The ostiomeatal complex is occluded. Anterior right ethmoid and frontal sinus opacification is noted as well. The left paranasal sinuses and bilateral sphenoid sinuses are clear. The mastoid air cells are clear. ASPECTS [facility]([place] Stroke Program Early CT Score) - Ganglionic level infarction (caudate, lentiform nuclei, internal capsule, insula, M1-M3 cortex): 7/7 - Supraganglionic infarction (M4-M6 cortex): 3/3 Total score (0-10 with 10 being normal): 10/10 IMPRESSION: 1. Subacute to early chronic right ACA territory infarcts without hemorrhage. There is some volume loss. 2. More remote infarct in the inferior medial right occipital lobe. 3. Remote infarct of the right internal capsule. 4. No acute infarct. 5. Extensive anterior right-sided sinus disease 6. ASPECTS is 10/10 The These results were called by telephone at the time of interpretation on 02/08/2017 at 4:27 pm to Dr. Wilford CornerARORA, who verbally acknowledged these results. Electronically Signed   By: Marin Robertshristopher  Mattern M.D.   On: 02/08/2017 16:28     Labs:   Basic Metabolic Panel:  Recent Labs Lab 02/08/17 1634 02/08/17 1646 02/10/17 0409  NA 136 136 134*  K 4.5 4.5 3.8  CL 100* 97* 100*  CO2 29  --  25  GLUCOSE 176* 174*  98  BUN 20 23* 20  CREATININE 1.59* 1.50* 1.60*  CALCIUM 8.9  --  8.7*   GFR Estimated Creatinine Clearance: 72.5 mL/min (A) (by C-G formula based on SCr of 1.6 mg/dL (H)). Liver Function Tests:  Recent Labs Lab 02/08/17 1634  AST 19  ALT 25  ALKPHOS 118  BILITOT 0.5  PROT 6.3*  ALBUMIN 3.1*   Coagulation profile  Recent Labs Lab 02/08/17 1634  INR 1.01    CBC:  Recent Labs Lab 02/08/17 1634 02/08/17 1646 02/10/17 0409  WBC 7.1  --  5.2  NEUTROABS 4.7  --   --   HGB 12.9* 13.3 11.5*  HCT 36.5* 39.0 34.6*  MCV 79.2  --  79.4  PLT 320  --  296   Cardiac Enzymes:  Recent Labs Lab 02/08/17 2240 02/09/17 0653 02/09/17 1157  TROPONINI <0.03 <0.03 <0.03   CBG:  Recent Labs Lab 02/10/17 0642 02/10/17 1126 02/10/17 1659 02/10/17 2207 02/11/17 0636  GLUCAP 102* 161* 176* 145* 115*   Lipid Profile  Recent Labs  02/09/17 0653  CHOL 138  HDL 39*  LDLCALC 73  TRIG 254128  CHOLHDL 3.5   Microbiology Recent Results (from the past 240 hour(s))  MRSA PCR Screening     Status: None   Collection Time: 02/08/17  9:55 PM  Result Value Ref Range Status   MRSA by PCR NEGATIVE NEGATIVE Final    Comment:        The GeneXpert MRSA Assay (FDA approved for NASAL specimens only), is one component of a comprehensive MRSA colonization surveillance program. It is not intended to diagnose MRSA infection nor to guide or monitor treatment for MRSA infections.      Discharge Instructions:   Discharge Instructions    Call MD for:  difficulty breathing, headache or visual disturbances    Complete by:  As directed    Call MD for:  extreme fatigue    Complete by:  As directed    Call MD for:  persistant dizziness or light-headedness    Complete by:  As directed    Diet - low sodium heart healthy    Complete by:  As directed  Diet Carb Modified    Complete by:  As directed    Increase activity slowly    Complete by:  As directed      Allergies as of  02/11/2017      Reactions   Beta Adrenergic Blockers Other (See Comments)   "Fatigue," but this isn't noted on the patient's [facility]      Medication List    STOP taking these medications   cloNIDine 0.2 MG tablet Commonly known as:  CATAPRES   HUMALOG 100 UNIT/ML injection Generic drug:  insulin lispro   metFORMIN 1000 MG tablet Commonly known as:  GLUCOPHAGE     TAKE these medications   aspirin 325 MG tablet Take 325 mg by mouth daily.   atorvastatin 80 MG tablet Commonly known as:  LIPITOR Take 80 mg by mouth at bedtime.   bisacodyl 10 MG suppository Commonly known as:  DULCOLAX Place 10 mg rectally once as needed (FOR CONSTIPATION IF NO RELIEF FROM MILK OF MAGNESIA).   clopidogrel 75 MG tablet Commonly known as:  PLAVIX Take 75 mg by mouth daily.   diltiazem 90 MG tablet Commonly known as:  CARDIZEM Take 90 mg by mouth every 6 (six) hours.   escitalopram 10 MG tablet Commonly known as:  LEXAPRO Take 10 mg by mouth daily.   insulin aspart 100 UNIT/ML injection Commonly known as:  novoLOG Inject 0-15 Units into the skin 3 (three) times daily with meals.   insulin aspart 100 UNIT/ML injection Commonly known as:  novoLOG Inject 0-5 Units into the skin at bedtime.   LANTUS 100 UNIT/ML injection Generic drug:  insulin glargine Inject 15 Units into the skin at bedtime.   magnesium hydroxide 400 MG/5ML suspension Commonly known as:  MILK OF MAGNESIA Take 30 mLs by mouth once as needed (FOR CONSTIPATION).   nitroGLYCERIN 0.4 MG SL tablet Commonly known as:  NITROSTAT Place 0.4 mg under the tongue every 5 (five) minutes x 3 doses as needed for chest pain.   ondansetron 4 MG tablet Commonly known as:  ZOFRAN Take 4 mg by mouth every 4 (four) hours as needed for nausea or vomiting.   RA SALINE ENEMA 19-7 GM/118ML Enem Place 1 enema rectally once as needed (FOR CONSTIPATION NOT RELIEVED BY DULCOLAX SUPPOSITORY AND CALL MD IF NO RELIEF FROM ENEMA).       Follow-up Information    Pecola Lawless, MD. Schedule an appointment as soon as possible for a visit.   Specialty:  Internal Medicine Why:  After d/c from rehab for follow up. Contact information: [address] [place] [address] [phone]            Time coordinating discharge: 35 minutes.  Signed:  Olivier Frayre  Pager [phone] [facility] 02/11/2017, 1:27 PM

## 2017-02-11 NOTE — PMR Pre-admission (Signed)
PMR Admission Coordinator Pre-Admission Assessment  Patient: Frank Moss is an 46 y.o., male MRN: [id number] DOB: January 17, 1971 Height: 6\' 2"  (188 cm) Weight: 103.7 kg (228 lb 11.2 oz)              Insurance Information HMO:     PPO: X     PCP:      IPA:      80/20:      OTHER:  PRIMARY: [employer]      Policy#: [id number]      Subscriber: Spouse CM Name: Mallie Snooks      Phone#: [phone] [id number]     Fax#: [phone] Pre-Cert#: [id number]  02/11/17-02/17/17, faxed clinical updates due 02/17/17    Employer: Full-Time Benefits:  Phone #: [phone]     Name: Janeice Robinson. Date: 07/19/12     Deduct: $700      Out of Pocket Max: $4,000      Life Max: N/A CIR: $100 co-pay per admission; 10% co-insurance       SNF: 10% co-insurance  Outpatient: PT/OT/SLP     Co-Pay: $20 Home Health: PT/OT/SLP      Co-Pay: 10% co-insurance  DME:      Co-Pay: 10% co-insurance  Providers: In-network  Medicaid Application Date:       Case Manager:  Disability Application Date:       Case Worker:   Emergency Conservator, museum/gallery Information    Name Relation Home Work Mobile   [name],[name] Spouse [phone]  [phone]     Current Medical History  Patient Admitting Diagnosis:Left hemiparesis, left neglect secondary to right ACA infarct, left hemisensory deficits made in addition be related to the small right thalamic infarct.   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 He was admitted to [facility] and started on aspirin and Plavix for his stroke prophylaxis. Hemoglobin A1c was 11.2, LDL- 65, and triglycerides-  298. During that hospitalization, he had evidence of depression as well as urinary retention and a Foley catheter was placed. He was discharged to a skilled nursing facility for rehab. While at [facility] , patient had physical,  occupational and speech therapy. On 02/08/2017, he had worsening of swallow function and was not able to swallow food or medications--no abnormal seizure type activity reported. He was sent to [facility] ED and admitted after MRI demonstrated small acute infarct in the right anterior thalamus, a subacute right ACA infarct, chronic microvascular ischemic changes advanced for age and chronic right occipital infarct.  Cardiology evaluated the patient, recommended TEE. This was performed 7/27 and revealed normal LVF, no shunt, no thrombus or vegetation and trivial pericardial effusion. Stroke felt to be embolic due to severe A2 and P2 segment atheromatous stenosis, advanced SVD and poorly controlled diabetes. Cardiology recommending  30 day heart monitor over loop recorder. Therapy evaluation revealed need for Huntley Dec Plus to maximum assist level needed for stand pivot transfers, bed mobility was mod assist X2 and occupational therapy and demonstrated max assist X2 for toileting. He also demonstrated slow processing with decreased attention and no s/s of aspiration seen therefore on regular diet with thin liquids. Patient admitted to IP Rehab 02/11/17.   NIH Total: 3    Past Medical History  Past Medical History:  Diagnosis Date  . Abnormal ECG   . Acute ischemic right ACA stroke (HCC)   . Coronary artery  disease, occlusive   . Dysfunction of eustachian tube   . History of right ACA stroke   . Hyperlipidemia associated with type 2 diabetes mellitus (HCC)   . Hypertension   . Impotence of organic origin   . Lumbago   . Lymphadenitis    Unspecified, except mesenteric  . Type 2 diabetes, uncontrolled, with retinopathy (HCC)   . Urinary retention 01/2017    Family History  family history includes Depression in his mother; Hypertension in his mother.  Prior Rehab/Hospitalizations:  Has the patient had major surgery during 100 days prior to admission? No  Current Medications   Current  Facility-Administered Medications:  .  acetaminophen (TYLENOL) tablet 650 mg, 650 mg, Oral, Q4H PRN, 650 mg at 02/09/17 2257 **OR** acetaminophen (TYLENOL) solution 650 mg, 650 mg, Per Tube, Q4H PRN **OR** acetaminophen (TYLENOL) suppository 650 mg, 650 mg, Rectal, Q4H PRN, Jonah BlueYates, Jennifer, MD .  amLODipine (NORVASC) tablet 5 mg, 5 mg, Oral, Daily, Rama, Maryruth Bunhristina P, MD .  aspirin EC tablet 325 mg, 325 mg, Oral, Daily, Jonah BlueYates, Jennifer, MD, 325 mg at 02/10/17 0936 .  atorvastatin (LIPITOR) tablet 80 mg, 80 mg, Oral, QHS, Jonah BlueYates, Jennifer, MD, 80 mg at 02/10/17 2209 .  bisacodyl (DULCOLAX) suppository 10 mg, 10 mg, Rectal, Once PRN, Jonah BlueYates, Jennifer, MD .  clopidogrel (PLAVIX) tablet 75 mg, 75 mg, Oral, Daily, Jonah BlueYates, Jennifer, MD, 75 mg at 02/10/17 0936 .  enoxaparin (LOVENOX) injection 40 mg, 40 mg, Subcutaneous, Q24H, Jonah BlueYates, Jennifer, MD, 40 mg at 02/10/17 2208 .  escitalopram (LEXAPRO) tablet 10 mg, 10 mg, Oral, Daily, Jonah BlueYates, Jennifer, MD, 10 mg at 02/10/17 0936 .  insulin aspart (novoLOG) injection 0-15 Units, 0-15 Units, Subcutaneous, TID WC, Vann, Jessica U, DO, 3 Units at 02/10/17 1742 .  insulin aspart (novoLOG) injection 0-5 Units, 0-5 Units, Subcutaneous, QHS, Vann, Jessica U, DO .  insulin glargine (LANTUS) injection 15 Units, 15 Units, Subcutaneous, QHS, Jonah BlueYates, Jennifer, MD, 15 Units at 02/10/17 2209 .  senna-docusate (Senokot-S) tablet 1 tablet, 1 tablet, Oral, QHS PRN, Jonah BlueYates, Jennifer, MD .  sodium phosphate (FLEET) 7-19 GM/118ML enema 1 enema, 1 enema, Rectal, Once PRN, Jonah BlueYates, Jennifer, MD  Patients Current Diet: Diet - low sodium heart healthy Diet Carb Modified  Precautions / Restrictions Precautions Precautions: Fall Restrictions Weight Bearing Restrictions: No   Has the patient had 2 or more falls or a fall with injury in the past year?Yes since his initial CVA in SNF and hospital   Prior Activity Level Community (5-7x/wk): Prior to initial stroke on 01/20/17 patient was fully  independent and working full time.   Home Assistive Devices / Equipment Home Assistive Devices/Equipment: Wheelchair  Prior Device Use: Indicate devices/aids used by the patient prior to current illness, exacerbation or injury? None of the above  Prior Functional Level Prior Function Level of Independence: Needs assistance Gait / Transfers Assistance Needed: unable to sit to stand alone ADL's / Homemaking Assistance Needed: assist for all ADL Comments: history obtained from notes; Prior to CVA in early July he was completely independent (has MBA and works in Engineer, manufacturingarchitecture)  Self Care: Did the patient need help bathing, dressing, using the toilet or eating? Independent  Indoor Mobility: Did the patient need assistance with walking from room to room (with or without device)? Independent  Stairs: Did the patient need assistance with internal or external stairs (with or without device)? Independent  Functional Cognition: Did the patient need help planning regular tasks such as shopping or remembering to take medications?  Independent  Current Functional Level Cognition  Overall Cognitive Status: Impaired/Different from baseline Current Attention Level: Sustained Orientation Level: Oriented X4 Following Commands: Follows one step commands inconsistently, Follows one step commands with increased time Safety/Judgement: Decreased awareness of safety, Decreased awareness of deficits    Extremity Assessment (includes Sensation/Coordination)  Upper Extremity Assessment: LUE deficits/detail LUE Deficits / Details: Pt grossly 2/5, able to make fist - but unable to hold. Pt reports no difference in sensory, however MD neurology states sensory loss on left LUE Sensation: decreased light touch LUE Coordination: decreased fine motor, decreased gross motor  Lower Extremity Assessment: LLE deficits/detail, Difficult to assess due to impaired cognition LLE Deficits / Details: noted weakness LLE  grossly 3/5 LLE Sensation: decreased light touch LLE Coordination: decreased fine motor, decreased gross motor    ADLs  Overall ADL's : Needs assistance/impaired Eating/Feeding: NPO Grooming: Wash/dry hands, Minimal assistance, Sitting Grooming Details (indicate cue type and reason): mod A to sit EOB and perform grooming as Pt requires assist from RUE for seated balance Upper Body Bathing: Maximal assistance Lower Body Bathing: Maximal assistance Upper Body Dressing : Maximal assistance Lower Body Dressing: Maximal assistance Toilet Transfer: Maximal assistance, +2 for physical assistance, +2 for safety/equipment, Stand-pivot, BSC Toilet Transfer Details (indicate cue type and reason): simulated through recliner transfer. Pt has foley Toileting- Clothing Manipulation and Hygiene: Total assistance Functional mobility during ADLs: Maximal assistance, +2 for physical assistance, +2 for safety/equipment, Cueing for safety, Cueing for sequencing    Mobility  Overal bed mobility: Needs Assistance Bed Mobility: Rolling, Sidelying to Sit Rolling: Mod assist, +2 for safety/equipment Sidelying to sit: Mod assist, +2 for physical assistance General bed mobility comments: Pt required multimodal cueing for sequencing, and mod +2 assist for rolling and trunk elevation    Transfers  Overall transfer level: Needs assistance Equipment used: 2 person hand held assist Transfers: Sit to/from Stand, Stand Pivot Transfers Sit to Stand: Mod assist, +2 physical assistance Stand pivot transfers: Max assist, +2 physical assistance, +2 safety/equipment General transfer comment: sit<>stand x2 from bed with blocking out of LLE and vc to bring body weight to the right. multimodal cues during stand pivot with max A +2 to the left, Pt with heavy lean to the left during transfers    Ambulation / Gait / Stairs / Engineer, drilling / Balance Dynamic Sitting Balance Sitting balance - Comments: cues  to use RUE to hold on at bedrail, when holding on, able to sit min guard - with no UE support Pt is a mod A to maintain sitting EOB Balance Overall balance assessment: Needs assistance Sitting-balance support: Single extremity supported, Feet supported Sitting balance-Leahy Scale: Poor Sitting balance - Comments: cues to use RUE to hold on at bedrail, when holding on, able to sit min guard - with no UE support Pt is a mod A to maintain sitting EOB Postural control: Left lateral lean, Posterior lean Standing balance support: Single extremity supported Standing balance-Leahy Scale: Poor Standing balance comment: reliant on support from 2 therapists to remain standing, heavy left lean, blocking on Left knee    Special needs/care consideration BiPAP/CPAP: CPAP at home PTA CPM: No Continuous Drip IV: No Dialysis: No        Life Vest: No Oxygen: No Special Bed: No Trach Size: No Wound Vac (area): No       Skin: Abrasion right lower leg  Bowel mgmt: Continent 02/10/17 Bladder mgmt: Incontinent  Diabetic mgmt: HgbA1c 11.2     [facility]: No Additional Comments: [facility]  Discharge Living Setting Plans for Discharge Living Setting: Patient's home, Lives with (comment) (spouse, daughter) Type of Home at Discharge: House Discharge Home Layout: Two level, 1/2 bath on main level Alternate Level Stairs-Rails: Right Alternate Level Stairs-Number of Steps: 12-13 Discharge Home Access: Stairs to enter Entrance Stairs-Rails: None Entrance Stairs-Number of Steps: 2-3 after a 5-6 foot inclined walkway  Discharge Bathroom Shower/Tub: Tub/shower unit Discharge Bathroom Toilet: Standard Discharge Bathroom Accessibility: Yes How Accessible: Accessible via walker Does the patient have any problems obtaining your medications?: No  Social/Family/Support Systems Patient  Roles: Spouse, Parent Contact Information: Spouse: Domingo CockingMiriam Peloquin (cell) [phone] Anticipated Caregiver: Spouse, daughter, and daughter's partner  Anticipated Caregiver's Contact Information: see above  Ability/Limitations of Caregiver: spouse works but all together they can provide 24/7 Caregiver Availability: 24/7 Discharge Plan Discussed with Primary Caregiver: Yes Is Caregiver In Agreement with Plan?: Yes Does Caregiver/Family have Issues with Lodging/Transportation while Pt is in Rehab?: No  Goals/Additional Needs Patient/Family Goal for Rehab: PT/OT Min A; SLP Mod I Expected length of stay: 21-25 days  Cultural Considerations: None Dietary Needs: Heart Healthy & Carb. Mod. Equipment Needs: TBD Special Service Needs: None Additional Information: None Pt/Family Agrees to Admission and willing to participate: Yes Program Orientation Provided & Reviewed with Pt/Caregiver Including Roles  & Responsibilities: Yes Additional Information Needs: None Information Needs to be Provided By: N/A  Barriers to Discharge: Home environment access/layout (12-13 steps to second floor bed and bath; 1/2 bath on main )  Decrease burden of Care through IP rehab admission: No  Possible need for SNF placement upon discharge: No  Patient Condition: This patient's condition remains as documented in the consult dated 02/10/17, in which the Rehabilitation Physician determined and documented that the patient's condition is appropriate for intensive rehabilitative care in an inpatient rehabilitation facility. Will admit to inpatient rehab today.  Preadmission Screen Completed By:  Fae PippinMelissa Raylene Carmickle, 02/11/2017 3:52 PM ______________________________________________________________________   Discussed status with Dr. Riley KillSwartz on 02/11/17 at 1600 and received telephone approval for admission today.  Admission Coordinator:  Fae PippinMelissa Khristen Cheyney, time 1600/Date 02/11/17

## 2017-02-11 NOTE — Progress Notes (Addendum)
Inpatient Rehabilitation  Met with patient and daughter to discuss team's recommendation for IP Rehab.  Shared booklets and answered questions.  I have received insurance authorization for admit today, have a bed to offer, and received medical clearance.  However, I await a call back from spouse.  Hopeful to follow up with her at 4pm to confirm admission.  Please call with questions.   Update: I have met and discussed program with spouse who is in agreement.  Will proceed with admission today.    Carmelia Roller., CCC/SLP Admission Coordinator  [place]  Cell [phone]

## 2017-02-11 NOTE — Progress Notes (Signed)
PT Cancellation Note  Patient Details Name: Frank Moss MRN: [id number] DOB: 11-04-1970   Cancelled Treatment:    Reason Eval/Treat Not Completed: Patient at procedure or test/unavailable. Pt off of the floor for Endo. PT will continue to f/u with pt as available.   [name] 02/11/2017, 9:59 AM

## 2017-02-11 NOTE — Transfer of Care (Signed)
Immediate Anesthesia Transfer of Care Note  Patient: Frank Moss  Procedure(s) Performed: Procedure(s): TRANSESOPHAGEAL ECHOCARDIOGRAM (TEE) (N/A)  Patient Location: Endoscopy Unit  Anesthesia Type:MAC  Level of Consciousness: awake and alert   Airway & Oxygen Therapy: Patient Spontanous Breathing and Patient connected to nasal cannula oxygen  Post-op Assessment: Report given to RN and Post -op Vital signs reviewed and stable  Post vital signs: Reviewed and stable  Last Vitals:  Vitals:   02/11/17 1045 02/11/17 1050  BP: (!) 182/78 (!) 194/83  Pulse: 73 73  Resp: 17 18  Temp:      Last Pain:  Vitals:   02/11/17 0955  TempSrc: Oral  PainSc:          Complications: No apparent anesthesia complications

## 2017-02-11 NOTE — Progress Notes (Signed)
Patient ID: Frank Moss, male   DOB: 08-19-1970, 46 y.o.   MRN: [id number] Patient arrived with RN from 44M via hospital bed. Family arrived shortly after. Patient and family oriented to room, rehab safety plan, health resource note book, fall prevention plan, and rehab process with verbal understanding. Patient resting comfortably in bed with call bell at side and dinner tray has been delivered.

## 2017-02-11 NOTE — Progress Notes (Signed)
OT Cancellation Note  Patient Details Name: Frank Moss MRN: [id number] DOB: [birth year]   Cancelled Treatment:    Reason Eval/Treat Not Completed: Patient at procedure or test/ unavailable. OT will continue to follow and see as schedule allows.   Evern BioLaura J Oval Cavazos 02/11/2017, 10:14 AM  Sherryl MangesLaura Rakiyah Esch OTR/L [phone]

## 2017-02-11 NOTE — Progress Notes (Signed)
  Echocardiogram Echocardiogram Transesophageal has been performed.  Frank Moss, Frank Moss 02/11/2017, 11:55 AM

## 2017-02-11 NOTE — Anesthesia Procedure Notes (Signed)
Procedure Name: MAC Date/Time: 02/11/2017 11:10 AM Performed by: Garrison Columbus T Pre-anesthesia Checklist: Patient identified, Emergency Drugs available, Suction available and Patient being monitored Patient Re-evaluated:Patient Re-evaluated prior to induction Oxygen Delivery Method: Nasal cannula Preoxygenation: Pre-oxygenation with 100% oxygen Induction Type: IV induction Placement Confirmation: positive ETCO2 and breath sounds checked- equal and bilateral Dental Injury: Teeth and Oropharynx as per pre-operative assessment

## 2017-02-12 ENCOUNTER — Inpatient Hospital Stay (HOSPITAL_COMMUNITY): Payer: 59 | Admitting: Occupational Therapy

## 2017-02-12 ENCOUNTER — Inpatient Hospital Stay (HOSPITAL_COMMUNITY): Payer: 59 | Admitting: Speech Pathology

## 2017-02-12 ENCOUNTER — Inpatient Hospital Stay (HOSPITAL_COMMUNITY): Payer: 59 | Admitting: Physical Therapy

## 2017-02-12 DIAGNOSIS — R339 Retention of urine, unspecified: Secondary | ICD-10-CM

## 2017-02-12 DIAGNOSIS — Z794 Long term (current) use of insulin: Secondary | ICD-10-CM

## 2017-02-12 DIAGNOSIS — E119 Type 2 diabetes mellitus without complications: Secondary | ICD-10-CM | POA: Insufficient documentation

## 2017-02-12 DIAGNOSIS — E46 Unspecified protein-calorie malnutrition: Secondary | ICD-10-CM

## 2017-02-12 DIAGNOSIS — D62 Acute posthemorrhagic anemia: Secondary | ICD-10-CM | POA: Diagnosis present

## 2017-02-12 DIAGNOSIS — E871 Hypo-osmolality and hyponatremia: Secondary | ICD-10-CM

## 2017-02-12 DIAGNOSIS — I10 Essential (primary) hypertension: Secondary | ICD-10-CM

## 2017-02-12 DIAGNOSIS — E1159 Type 2 diabetes mellitus with other circulatory complications: Secondary | ICD-10-CM

## 2017-02-12 DIAGNOSIS — E8809 Other disorders of plasma-protein metabolism, not elsewhere classified: Secondary | ICD-10-CM

## 2017-02-12 DIAGNOSIS — N183 Chronic kidney disease, stage 3 unspecified: Secondary | ICD-10-CM

## 2017-02-12 LAB — COMPREHENSIVE METABOLIC PANEL
ALBUMIN: 2.9 g/dL — AB (ref 3.5–5.0)
ALK PHOS: 110 U/L (ref 38–126)
ALT: 24 U/L (ref 17–63)
ANION GAP: 6 (ref 5–15)
AST: 24 U/L (ref 15–41)
BUN: 13 mg/dL (ref 6–20)
CO2: 28 mmol/L (ref 22–32)
Calcium: 8.5 mg/dL — ABNORMAL LOW (ref 8.9–10.3)
Chloride: 100 mmol/L — ABNORMAL LOW (ref 101–111)
Creatinine, Ser: 1.3 mg/dL — ABNORMAL HIGH (ref 0.61–1.24)
GFR calc Af Amer: >60 mL/min (ref >60)
GFR calc non Af Amer: >60 mL/min (ref >60)
GLUCOSE: 90 mg/dL (ref 65–99)
POTASSIUM: 4 mmol/L (ref 3.5–5.1)
SODIUM: 134 mmol/L — AB (ref 135–145)
TOTAL PROTEIN: 6.3 g/dL — AB (ref 6.5–8.1)
Total Bilirubin: 0.6 mg/dL (ref 0.3–1.2)

## 2017-02-12 LAB — CBC WITH DIFFERENTIAL/PLATELET
BASOS PCT: 1 %
Basophils Absolute: 0 10*3/uL (ref 0.0–0.1)
EOS ABS: 0.2 10*3/uL (ref 0.0–0.7)
Eosinophils Relative: 3 %
HCT: 36.2 % — ABNORMAL LOW (ref 39.0–52.0)
HEMOGLOBIN: 12.4 g/dL — AB (ref 13.0–17.0)
LYMPHS ABS: 2.1 10*3/uL (ref 0.7–4.0)
Lymphocytes Relative: 36 %
MCH: 26.8 pg (ref 26.0–34.0)
MCHC: 34.3 g/dL (ref 30.0–36.0)
MCV: 78.4 fL (ref 78.0–100.0)
Monocytes Absolute: 0.6 10*3/uL (ref 0.1–1.0)
Monocytes Relative: 10 %
NEUTROS PCT: 50 %
Neutro Abs: 3 10*3/uL (ref 1.7–7.7)
Platelets: 279 10*3/uL (ref 150–400)
RBC: 4.62 MIL/uL (ref 4.22–5.81)
RDW: 13.3 % (ref 11.5–15.5)
WBC: 5.9 10*3/uL (ref 4.0–10.5)

## 2017-02-12 MED ORDER — PRO-STAT SUGAR FREE PO LIQD
30.0000 mL | Freq: Two times a day (BID) | ORAL | Status: DC
  Administered 2017-02-12 – 2017-03-11 (×55): 30 mL via ORAL
  Filled 2017-02-12 (×56): qty 30

## 2017-02-12 MED ORDER — AMLODIPINE BESYLATE 5 MG PO TABS
7.5000 mg | ORAL_TABLET | Freq: Every day | ORAL | Status: DC
  Administered 2017-02-12 – 2017-02-13 (×2): 7.5 mg via ORAL
  Filled 2017-02-12 (×2): qty 1

## 2017-02-12 NOTE — Progress Notes (Signed)
[place] PHYSICAL MEDICINE & REHABILITATION     PROGRESS NOTE  Subjective/Complaints:  Patient seen lying in bed this morning. He is slow to process. He slept well overnight. He is ready for therapies today.  ROS: Denies CP, SOB, nausea, vomiting, diarrhea.  Objective: Vital Signs: Blood pressure (!) 194/88, pulse 72, temperature 98.2 F (36.8 C), temperature source Oral, resp. rate 18, height 6\' 2"  (1.88 m), weight 104.8 kg (231 lb), SpO2 98 %. No results found.  Recent Labs  02/10/17 0409 02/12/17 0521  WBC 5.2 5.9  HGB 11.5* 12.4*  HCT 34.6* 36.2*  PLT 296 279    Recent Labs  02/10/17 0409 02/12/17 0521  NA 134* 134*  K 3.8 4.0  CL 100* 100*  GLUCOSE 98 90  BUN 20 13  CREATININE 1.60* 1.30*  CALCIUM 8.7* 8.5*   CBG (last 3)   Recent Labs  02/10/17 2207 02/11/17 0636 02/11/17 1818  GLUCAP 145* 115* 82    Wt Readings from Last 3 Encounters:  02/11/17 104.8 kg (231 lb)  02/08/17 103.7 kg (228 lb 11.2 oz)  02/08/17 124.3 kg (274 lb)    Physical Exam:  BP (!) 194/88 (BP Location: Right Arm)   Pulse 72   Temp 98.2 F (36.8 C) (Oral)   Resp 18   Ht 6\' 2"  (1.88 m)   Wt 104.8 kg (231 lb)   SpO2 98%   BMI 29.66 kg/m  Constitutional: He appears well-developedand well-nourished.  HENT: Normocephalicand atraumatic.  Eyes: EOMI. No discharge. Cardiovascular: Regular rhythm. No JVD. Respiratory: Effort normaland breath sounds normal.  GI: Soft. Bowel sounds are normal.   Neurological: He is alertand oriented.  Left facial weakness with left gaze preference and right neglect.  Delayed processing with slow speech. His attention is poor and he lacks awareness/insight into deficits. Spastic left hemiparesis  Motor: Left lower extremity: 2+/5 proximal to distal Skin: Skin is warmand dry.  Psychiatric: His affect is blunt. His speech is delayed. He is slowed. He expresses inappropriate judgment. He is inattentive.    Assessment/Plan: 1. Functional  deficits secondary to right ACA infarct, small right thalamic infarct which require 3+ hours per day of interdisciplinary therapy in a comprehensive inpatient rehab setting. Physiatrist is providing close team supervision and 24 hour management of active medical problems listed below. Physiatrist and rehab team continue to assess barriers to discharge/monitor patient progress toward functional and medical goals.  Function:  Bathing Bathing position      Bathing parts      Bathing assist        Upper Body Dressing/Undressing Upper body dressing                    Upper body assist        Lower Body Dressing/Undressing Lower body dressing                                  Lower body assist        Toileting Toileting          Toileting assist     Transfers Chair/bed transfer             Locomotion Ambulation           Wheelchair          Cognition Comprehension Comprehension assist level: Understands basic 90% of the time/cues < 10% of the time  Expression Expression assist  level: Expresses complex 90% of the time/cues < 10% of the time, Expresses basic 90% of the time/requires cueing < 10% of the time.  Social Interaction Social Interaction assist level: Interacts appropriately 75 - 89% of the time - Needs redirection for appropriate language or to initiate interaction.  Problem Solving Problem solving assist level: Solves basic 75 - 89% of the time/requires cueing 10 - 24% of the time  Memory Memory assist level: Recognizes or recalls 90% of the time/requires cueing < 10% of the time    Medical Problem List and Plan: 1.  Left hemiparesis, hemisensory loss,  and cognitive deficits secondary to right ACA infarct, small right thalamic infarct on 7/24  Begin CIR  Notes reviewed,  images reviewed 2.  DVT Prophylaxis/Anticoagulation: Pharmaceutical: Lovenox 3. H/o lumbago/Pain Management: Denies pain at this moment. Tylenol prn 4. Mood:  LCSW to follow for evaluation and support.  5. Neuropsych: This patient is not capable of making decisions on his  own behalf. 6. Skin/Wound Care: routine pressure relief measures 7. Fluids/Electrolytes/Nutrition: Monitor I/Os--offer supplements if intake poor.   8.T2DM: monitor BS ac/hs. Continue lantus daily with meal coverage. Will use SSI for elevated BS.   Monitor with increased mobility 9. HTN:  Monitor BP bid  Norvasc 5mg  daily, increased to 7.5 on 7/28  Monitor with increased mobility,  10 H/o urinary retention: currently incontinent. Toilet patient every 4 hours and cath if retention noted.   11. Hyperlipidemia: On atorvastatin.  12. Question CKD: baseline SCr around 1.6 per records review. Continue to monitor.  Offer fluids between meals for adequate hydration.   Cr. 1.30 on 7/28  Cont to monitor 13. Hyponatremia  Sodium 134 on 7/28  Continue to monitor 14. Hypoalbuminemia  Supplement initiated 7/28 15. Acute blood loss anemia  Hemoglobin 12.4 on 7/28  Continue to monitor  LOS (Days) 1 A FACE TO FACE EVALUATION WAS PERFORMED  Frank Moss Frank Moss Frank Moss 02/12/2017 7:22 AM

## 2017-02-12 NOTE — Plan of Care (Signed)
Problem: RH BOWEL ELIMINATION Goal: RH STG MANAGE BOWEL WITH ASSISTANCE STG Manage Bowel with minimal [facility] to [facility].   Outcome: Not Progressing 2+ assist for transfer.

## 2017-02-12 NOTE — Evaluation (Signed)
Physical Therapy Assessment and Plan  Patient Details  Name: Frank Moss MRN: [id number] Date of Birth: 1971-07-02  PT Diagnosis: Abnormal posture, Abnormality of gait, Hemiplegia non-dominant and Impaired sensation Rehab Potential: Fair ELOS: 3.5-4 weeks    Today's Date: 02/12/2017 PT Individual Time: 3888-2800 PT Individual Time Calculation (min): 70 min    Problem List:  Patient Active Problem List   Diagnosis Date Noted  . Diabetes mellitus ([place])   . Benign essential HTN   . Urinary retention   . Stage 3 chronic kidney disease   . Hyponatremia   . Hypoalbuminemia due to protein-calorie malnutrition ([place])   . Acute blood loss anemia   . Acute thalamic infarction ([place]) 02/11/2017  . Left hemiparesis ([place]) 02/10/2017  . Cognitive deficit due to recent stroke 02/10/2017  . Hemi-neglect of left side 02/10/2017  . CKD (chronic kidney disease), stage III 02/09/2017  . Dysphagia as late effect of stroke 02/08/2017  . Acute ischemic right ACA stroke ([place]) 01/31/2017  . Mixed hyperlipidemia 11/17/2007  . Accelerated hypertension 11/17/2007  . DM (diabetes mellitus), type 2 with peripheral vascular complications ([place]) 34/91/7915  . ERECTILE DYSFUNCTION, MILD 10/09/2007  . SHOULDER PAIN, LEFT 10/09/2007  . COLONIC POLYPS, HX OF 10/09/2007    Past Medical History:  Past Medical History:  Diagnosis Date  . Abnormal ECG   . Acute ischemic right ACA stroke ([place])   . Coronary artery disease, occlusive   . Dysfunction of eustachian tube   . History of right ACA stroke   . Hyperlipidemia associated with type 2 diabetes mellitus ([place])   . Hypertension   . Impotence of organic origin   . Lumbago   . Lymphadenitis    Unspecified, except mesenteric  . Type 2 diabetes, uncontrolled, with retinopathy ([place])   . Urinary retention 01/2017   Past Surgical History: History reviewed. No pertinent surgical history.  Assessment & Plan Clinical Impression: Patient is a 46 year old right  handed male with history of T2DM with retinopathy, HTN, hyperlipidemia and medication noncompliance who had a right ACA stroke on 01/20/2017. He was admitted to [facility] and started on aspirin and Plavix for his stroke prophylaxis. Hemoglobin A1c was 11.2, LDL- 65, and triglycerides- 298. During that hospitalization, he had evidence of depression as well as urinary retention and a Foley catheter was placed. He was discharged to a skilled nursing facility for rehab. While at [facility] , patient had physical,occupational and speech therapy. On 02/08/2017, he had worsening of swallow function and was not able to swallow food or medications--no abnormal seizure type activity reported. He was sent to [facility] ED and admitted after MRI demonstrated small acute infarct in the right anterior thalamus, a subacute right ACA infarct, chronic microvascular ischemic changes advanced for age and chronic right occipital infarct. Cardiology evaluated the patient, recommended TEE. This was performed 7/27 and revealed normal LVF, no shunt, no thrombus or vegetation and trivial pericardial effusion. Stroke felt to be embolic due to severe A2 and P2 segment atheromatous stenosis, advanced SVD and poorly controlled diabetes. Cardiology recommending 30 day heart monitor over loop recorder. Therapy evaluation revealed need for Clarise Cruz Plus to maximum assist level needed for stand pivot transfers, bed mobility was mod assist X2 and occupational therapy and demonstrated max assist X 2 for toileting. He also demonstrated slow processing with decreased attention and no s/s of aspiration seen therefore on regular diet with thin liquids.   Patient transferred to CIR on 02/11/2017 .   Patient currently requires total  with mobility secondary to muscle weakness, impaired timing and sequencing, abnormal tone, unbalanced muscle activation, motor apraxia, decreased coordination and decreased motor planning, decreased midline orientation,  decreased attention to left, left side neglect, decreased motor planning and ideational apraxia, decreased initiation, decreased attention, decreased awareness, decreased problem solving, decreased safety awareness and delayed processing and decreased sitting balance, decreased standing balance, decreased postural control, hemiplegia and decreased balance strategies.  Prior to hospitalization, patient was independent  with mobility and lived with Spouse in a House home.  Home access is   .  Patient will benefit from skilled PT intervention to maximize safe functional mobility, minimize fall risk and decrease caregiver burden for planned discharge SNF.  Anticipate patient will SNF at discharge.  PT - End of Session Activity Tolerance: Tolerates < 10 min activity, no significant change in vital signs Endurance Deficit: Yes PT Assessment Rehab Potential (ACUTE/IP ONLY): Fair PT Barriers to Discharge: Inaccessible home environment;Decreased caregiver support;Home environment access/layout PT Patient demonstrates impairments in the following area(s): Balance;Behavior;Endurance;Motor;Nutrition;Pain;Perception;Safety;Sensory;Skin Integrity PT Transfers Functional Problem(s): Bed Mobility;Bed to Chair;Car;Furniture;Floor PT Locomotion Functional Problem(s): Ambulation;Wheelchair Mobility;Stairs PT Plan PT Intensity: Minimum of 1-2 x/day ,45 to 90 minutes PT Frequency: 5 out of 7 days PT Duration Estimated Length of Stay: 3.5-4 weeks  PT Treatment/Interventions: Ambulation/gait training;Balance/vestibular training;Cognitive remediation/compensation;Community reintegration;Discharge planning;Disease management/prevention;DME/adaptive equipment instruction;Functional electrical stimulation;Functional mobility training;Neuromuscular re-education;Pain management;Splinting/orthotics;Psychosocial support;Skin care/wound management;Patient/family education;Stair training;Therapeutic Activities;Therapeutic  Exercise;UE/LE Strength taining/ROM;UE/LE Coordination activities;Visual/perceptual remediation/compensation;Wheelchair propulsion/positioning PT Transfers Anticipated Outcome(s): Min assist with LRAD  PT Locomotion Anticipated Outcome(s): Min assist at [facility] level.  PT Recommendation Recommendations for Other Services: Neuropsych consult Follow Up Recommendations: Skilled nursing facility Patient destination: [place] (SNF) Equipment Recommended: Wheelchair cushion (measurements);Wheelchair (measurements);Rolling walker with 5" wheels;To be determined  Skilled Therapeutic Intervention PT instructed patient in PT Evaluation and initiated treatment intervention; see below for results. PT educated patient in [place], rehab potential, rehab goals, and discharge recommendations. Pt required max-total assist for [facility] transfer as well as total assist +2 sit<>stand and squat pivot as listed below. Pt demonstrated heavy pusher's syndrome throughout. Pt returned to bed at end of treatment. Sit>supine completed with total +2 assist as listed below and left supine in bed with call bell in reach and all needs met.     PT Evaluation Precautions/Restrictions Precautions Precautions: Fall Precaution Comments: pusher to the left, apraxia, decreased initiation, left hemiparesis Restrictions Weight Bearing Restrictions: No General   Vital Signs Pain Pain Assessment Pain Assessment: No/denies pain Pain Score: 0-No pain Home Living/Prior Functioning Home Living Available Help at Discharge:  (Pt reports that spouse works and is not sure if 24 hour is available) Type of Home: House Home Layout: Two level;1/2 bath on main level Bathroom Shower/Tub: Chiropodist: Standard  Lives With: Spouse Prior Function Level of Independence: Independent with basic ADLs  Able to Take Stairs?: Yes Driving: Yes Vocation: Full time employment Vision/Perception  Vision - Assessment Eye  Alignment: Within Functional Limits Ocular Range of Motion: Within Functional Limits Alignment/Gaze Preference: Within Defined Limits Tracking/Visual Pursuits: Decreased smoothness of vertical tracking;Decreased smoothness of horizontal tracking Saccades: Additional eye shifts occurred during testing Convergence: Impaired (comment) (convergenc not noted during testing) Perception Perception: Impaired Inattention/Neglect: Does not attend to left side of body Praxis Praxis: Impaired Praxis Impairment Details: Ideomotor Praxis-Other Comments: Pt with spontaneous movement of the LUE to scratch his face but demonstrated extreme difficulty getting it to move to command during testing.  Cognition Overall Cognitive Status: Impaired/Different from baseline Arousal/Alertness: Awake/alert  Attention: Focused;Sustained Focused Attention: Appears intact Sustained Attention: Impaired Sustained Attention Impairment: Functional basic;Functional complex Memory: Impaired Memory Impairment: Decreased recall of new information Awareness: Impaired Awareness Impairment: Intellectual impairment Problem Solving: Impaired Problem Solving Impairment: Functional basic Executive Function: Initiating;Sequencing Sequencing: Impaired Sequencing Impairment: Verbal complex Behaviors: Lability (Flat affect) Safety/Judgment: Impaired Sensation Sensation Light Touch: Impaired Detail Light Touch Impaired Details: Absent LUE;Absent RLE Stereognosis: Impaired Detail Stereognosis Impaired Details: Absent LUE Hot/Cold: Impaired Detail Hot/Cold Impaired Details: Absent LUE Proprioception: Impaired Detail Proprioception Impaired Details: Absent LUE;Absent RLE Additional Comments: unable to detect Light touch or propioception in LLE Coordination Gross Motor Movements are Fluid and Coordinated: No Fine Motor Movements are Fluid and Coordinated: No Coordination and Movement Description: Pt with severe motor apraxia  with attempted use of the LUE.  able to initate LUE movement but no movement on command.  Motor  Motor Motor: Abnormal tone;Hemiplegia;Motor apraxia;Abnormal postural alignment and control Motor - Skilled Clinical Observations: Pt pushes to the L and demonstrates  resistance to therapist assisting him back to midlined  Mobility Bed Mobility Bed Mobility: Rolling Right;Rolling Left;Sit to Supine;Supine to Sit Rolling Right: 1: +2 Total assist Rolling Right: Patient Percentage: 30% Rolling Right Details: Manual facilitation for weight shifting;Manual facilitation for placement;Verbal cues for safe use of DME/AE;Verbal cues for gait pattern;Verbal cues for precautions/safety;Manual facilitation for weight bearing Rolling Left: 1: +1 Total assist Rolling Left Details: Verbal cues for gait pattern;Verbal cues for safe use of DME/AE;Manual facilitation for placement;Verbal cues for precautions/safety;Manual facilitation for weight shifting Supine to Sit: 1: +2 Total assist Supine to Sit: Patient Percentage: 20% Supine to Sit Details: Verbal cues for safe use of DME/AE;Manual facilitation for weight shifting;Manual facilitation for placement;Verbal cues for precautions/safety;Verbal cues for sequencing;Verbal cues for technique Sit to Supine: 1: +2 Total assist Sit to Supine: Patient Percentage: 20% Sit to Supine - Details: Verbal cues for technique;Verbal cues for precautions/safety;Verbal cues for safe use of DME/AE;Manual facilitation for weight shifting;Manual facilitation for placement;Manual facilitation for weight bearing;Verbal cues for sequencing Transfers Transfers: Yes Sit to Stand: 1: +2 Total assist Sit to Stand Details: Tactile cues for placement;Tactile cues for weight beaing;Visual cues for safe use of DME/AE;Visual cues/gestures for precautions/safety;Visual cues/gestures for sequencing;Verbal cues for sequencing;Verbal cues for technique;Verbal cues for safe use of DME/AE;Verbal cues  for precautions/safety;Manual facilitation for weight shifting;Manual facilitation for placement;Manual facilitation for weight bearing Stand to Sit: 1: +2 Total assist Stand to Sit Details (indicate cue type and reason): Visual cues/gestures for sequencing;Verbal cues for sequencing;Verbal cues for technique;Verbal cues for precautions/safety;Verbal cues for safe use of DME/AE;Manual facilitation for placement;Manual facilitation for weight bearing;Manual facilitation for weight shifting Squat Pivot Transfers: 1: +2 Total assist Squat Pivot Transfer Details: Verbal cues for technique;Verbal cues for precautions/safety;Verbal cues for safe use of DME/AE;Manual facilitation for weight bearing;Manual facilitation for placement;Manual facilitation for weight shifting;Verbal cues for sequencing;Visual cues/gestures for sequencing;Tactile cues for weight beaing;Tactile cues for posture Locomotion  Ambulation Ambulation: No Gait Gait: No (unable to test) Stairs / Additional Locomotion Stairs: No (unsafe due to pushers syndrome) Wheelchair Mobility Wheelchair Mobility: No (unable to test. )  Trunk/Postural Assessment  Cervical Assessment Cervical Assessment: Exceptions to [facility] (cervical protraction at rest) Thoracic Assessment Thoracic Assessment: Exceptions to [facility] (left lateral trunk flexion at rest) Lumbar Assessment Lumbar Assessment: Exceptions to [facility] (maintains posterior pelvic tilt in sitting) Postural Control Postural Control: Deficits on evaluation Trunk Control: pt constantly pushing to the L through the RUE and the RLE  Balance Balance Balance Assessed: Yes  Static Sitting Balance Static Sitting - Balance Support: Feet supported;No upper extremity supported Static Sitting - Level of Assistance: 4: Min assist Dynamic Sitting Balance Dynamic Sitting - Balance Support: Feet supported;During functional activity Dynamic Sitting - Level of Assistance: 2: Max Dispensing optician Standing - Balance Support: Bilateral upper extremity supported Static Standing - Level of Assistance: 1: +1 Total assist;2: Max assist Extremity Assessment  RUE Assessment RUE Assessment: Within Functional Limits LUE Assessment LUE Assessment: Exceptions to [facility]       See Function Navigator for Current Functional Status.   Refer to Care Plan for Long Term Goals  Recommendations for other services: Neuropsych  Discharge Criteria: Patient will be discharged from PT if patient refuses treatment 3 consecutive times without medical reason, if treatment goals not met, if there is a change in medical status, if patient makes no progress towards goals or if patient is discharged from hospital.  The above assessment, treatment plan, treatment alternatives and goals were discussed and mutually agreed upon: by patient  Lorie Phenix 02/12/2017, 2:30 PM

## 2017-02-12 NOTE — Evaluation (Signed)
Speech Language Pathology Assessment and Plan  Patient Details  Name: Frank Moss MRN: [id number] Date of Birth: 09/24/1970  SLP Diagnosis: Cognitive Impairments  Rehab Potential: Fair ELOS: 21 days     Today's Date: 02/12/2017 SLP Individual Time: 81-1530 SLP Individual Time Calculation (min): 55 min   Problem List:  Patient Active Problem List   Diagnosis Date Noted  . Diabetes mellitus ([place])   . Benign essential HTN   . Urinary retention   . Stage 3 chronic kidney disease   . Hyponatremia   . Hypoalbuminemia due to protein-calorie malnutrition ([place])   . Acute blood loss anemia   . Acute thalamic infarction ([place]) 02/11/2017  . Left hemiparesis ([place]) 02/10/2017  . Cognitive deficit due to recent stroke 02/10/2017  . Hemi-neglect of left side 02/10/2017  . CKD (chronic kidney disease), stage III 02/09/2017  . Dysphagia as late effect of stroke 02/08/2017  . Acute ischemic right ACA stroke ([place]) 01/31/2017  . Mixed hyperlipidemia 11/17/2007  . Accelerated hypertension 11/17/2007  . DM (diabetes mellitus), type 2 with peripheral vascular complications ([place]) 23/76/2831  . ERECTILE DYSFUNCTION, MILD 10/09/2007  . SHOULDER PAIN, LEFT 10/09/2007  . COLONIC POLYPS, HX OF 10/09/2007   Past Medical History:  Past Medical History:  Diagnosis Date  . Abnormal ECG   . Acute ischemic right ACA stroke ([place])   . Coronary artery disease, occlusive   . Dysfunction of eustachian tube   . History of right ACA stroke   . Hyperlipidemia associated with type 2 diabetes mellitus ([employer])   . Hypertension   . Impotence of organic origin   . Lumbago   . Lymphadenitis    Unspecified, except mesenteric  . Type 2 diabetes, uncontrolled, with retinopathy ([place])   . Urinary retention 01/2017   Past Surgical History: History reviewed. No pertinent surgical history.  Assessment / Plan / Recommendation Clinical Impression   Frank Moss is a 46 year old right handed male with history of  T2DM with retinopathy, HTN, hyperlipidemia and medication noncompliance who had a right ACA stroke on 01/20/2017.  During that hospitalization, he had evidence of depression as well as urinary retention and a Foley catheter was placed. He was discharged to a skilled nursing facility for rehab. While at [facility] , patient had physical,occupational and speech therapy. On 02/08/2017, he had worsening of swallow function and was not able to swallow food or medications--no abnormal seizure type activity reported. He was sent to [facility] ED and admitted after MRI demonstrated small acute infarct in the right anterior thalamus, a subacute right ACA infarct, chronic microvascular ischemic changes advanced for age and chronic right occipital infarct.  Stroke felt to be embolic due to severe A2 and P2 segment atheromatous stenosis, advanced SVD and poorly controlled diabetes.  He demonstrated slow processing with decreased attention and no s/s of aspiration seen therefore on regular diet with thin liquids. Pt was admitted to CIR and SLP evaluation was completed on 02/12/2017 with the following results:  Pt presents with moderately severe cognitive impairments characterized by decreased sustained attention to tasks, decreased recall of information, and decreased functional problem solving.  As a result, pt would benefit from skilled ST while inpatient in order to maximize functional independence and reduce burden of care prior to discharge.  Pt has a very flat affect and slowed processing, answering in very short, simple replies, engaging only minimally with therapist; however, when asked if he is depressed or having difficulty coping or adjusting to his loss of independence, he  denies.  Anticipate that pt will need 24/7 supervision at discharge due to the extent of his cognitive impairments s/p CVA; however, it is unlikely that family will be able to provide this as pt's wife works full time and he endorses no other  family who can assist other than his 28 year old daughter who he reports is home from school for the summer.    Skilled Therapeutic Interventions          Cognitive-linguistic evaluation completed with results and recommendations reviewed with family.   Pt scored 18 out of 30 on the MoCA standardized cognitive assessment (n>/=26) with deficits most notable in the areas mentioned above.  Pt needed max assist/ choice of three to recall 5 out of 5 words on the delayed recall subtest and max assist on executive functioning and serial subtraction subtests.  Pt was left in bed with bed alarm set with call bell within reach.     SLP Assessment  Patient will need skilled [place] [facility] during CIR admission    Recommendations  Recommendations for Other Services: Neuropsych consult Patient destination: [place] (SNF) Follow up Recommendations: Skilled Nursing facility Equipment Recommended: None recommended by SLP    SLP Frequency 3 to 5 out of 7 days   SLP Duration  SLP Intensity  SLP Treatment/Interventions 21 days   Minumum of 1-2 x/day, 30 to 90 minutes  Cognitive remediation/compensation;Cueing hierarchy;Environmental controls;Functional tasks;Internal/external aids;Patient/family education    Pain Pain Assessment Pain Assessment: No/denies pain Pain Score: 0-No pain  Prior Functioning Cognitive/Linguistic Baseline: Within functional limits Type of Home: House  Lives With: Spouse Available Help at Discharge: Family;Available PRN/intermittently Education: master's degree in business  Vocation: Full time employment  Function:  Eating Eating                 Cognition Comprehension Comprehension assist level: Understands basic 90% of the time/cues < 10% of the time  Expression   Expression assist level: Expresses basic 75 - 89% of the time/requires cueing 10 - 24% of the time. Needs helper to occlude trach/needs to repeat words.  Social  Interaction Social Interaction assist level: Interacts appropriately 50 - 74% of the time - May be physically or verbally inappropriate.  Problem Solving Problem solving assist level: Solves basic 25 - 49% of the time - needs direction more than half the time to initiate, plan or complete simple activities  Memory Memory assist level: Recognizes or recalls 25 - 49% of the time/requires cueing 50 - 75% of the time   Short Term Goals: Week 1: SLP Short Term Goal 1 (Week 1): Pt will initiate and complete tasks in a timely manner during basic functional tasks with mod assist verbal cues.   SLP Short Term Goal 2 (Week 1): Pt will complete basic, familiar tasks with mod assist verbal cues for functional problem solving.   SLP Short Term Goal 3 (Week 1): Pt will utilize external aids to recall basic, daily information with mod assist verbal cues.   SLP Short Term Goal 4 (Week 1): Pt will sustain his attention to basic, familiar tasks for 5-7 minutes with mod assist verbal cues for redirection.    Refer to Care Plan for Long Term Goals  Recommendations for other services: Neuropsych  Discharge Criteria: Patient will be discharged from SLP if patient refuses treatment 3 consecutive times without medical reason, if treatment goals not met, if there is a change in medical status, if patient makes no progress towards  goals or if patient is discharged from hospital.  The above assessment, treatment plan, treatment alternatives and goals were discussed and mutually agreed upon: by patient  Emilio Math 02/12/2017, 5:09 PM

## 2017-02-12 NOTE — Progress Notes (Signed)
Nutrition Brief Note  Patient identified on the Malnutrition Screening Tool (MST) Report  Wt Readings from Last 15 Encounters:  02/11/17 231 lb (104.8 kg)  02/08/17 228 lb 11.2 oz (103.7 kg)  02/08/17 274 lb (124.3 kg)  02/07/17 274 lb (124.3 kg)  01/31/17 274 lb (124.3 kg)  01/28/17 274 lb (124.3 kg)  11/17/07 (!) 274 lb (124.3 kg)  10/09/07 (!) 275 lb (124.7 kg)    Body mass index is 29.66 kg/m.   Current diet order is Heart Healthy/Carb Modified, patient is consuming approximately 60-75% of meals at this time. Previously eating 100% of meals prior to transfer to Rehab. Labs and medications reviewed.   Nutrition-Focused physical exam completed. Findings are WDL for fat depletion, muscle depletion, and edema.   No nutrition interventions warranted at this time. If nutrition issues arise, please consult RD.   Romelle Starcherate Shacora Zynda MS, RD, LDN [phone] Pager  [phone] Weekend/On-Call Pager

## 2017-02-12 NOTE — Evaluation (Signed)
Occupational Therapy Assessment and Plan  Patient Details  Name: Frank Moss MRN: [id number] Date of Birth: 03-01-1971  OT Diagnosis: abnormal posture, altered mental status, apraxia, cognitive deficits, hemiplegia affecting non-dominant side and muscle weakness (generalized) Rehab Potential: Rehab Potential (ACUTE ONLY): Good ELOS: 21-23 days   Today's Date: 02/12/2017 OT Individual Time: 0800-0901 OT Individual Time Calculation (min): 61 min     Problem List:  Patient Active Problem List   Diagnosis Date Noted  . Diabetes mellitus ([place])   . Benign essential HTN   . Urinary retention   . Stage 3 chronic kidney disease   . Hyponatremia   . Hypoalbuminemia due to protein-calorie malnutrition ([place])   . Acute blood loss anemia   . Acute thalamic infarction ([place]) 02/11/2017  . Left hemiparesis ([place]) 02/10/2017  . Cognitive deficit due to recent stroke 02/10/2017  . Hemi-neglect of left side 02/10/2017  . CKD (chronic kidney disease), stage III 02/09/2017  . Dysphagia as late effect of stroke 02/08/2017  . Acute ischemic right ACA stroke ([place]) 01/31/2017  . Mixed hyperlipidemia 11/17/2007  . Accelerated hypertension 11/17/2007  . DM (diabetes mellitus), type 2 with peripheral vascular complications ([place]) 67/67/2094  . ERECTILE DYSFUNCTION, MILD 10/09/2007  . SHOULDER PAIN, LEFT 10/09/2007  . COLONIC POLYPS, HX OF 10/09/2007    Past Medical History:  Past Medical History:  Diagnosis Date  . Abnormal ECG   . Acute ischemic right ACA stroke ([place])   . Coronary artery disease, occlusive   . Dysfunction of eustachian tube   . History of right ACA stroke   . Hyperlipidemia associated with type 2 diabetes mellitus ([place])   . Hypertension   . Impotence of organic origin   . Lumbago   . Lymphadenitis    Unspecified, except mesenteric  . Type 2 diabetes, uncontrolled, with retinopathy ([place])   . Urinary retention 01/2017   Past Surgical History: History reviewed. No pertinent  surgical history.  Assessment & Plan Clinical Impression: Patient is a 46 y.o. year old male with recent admission to the hospital on 01/20/2017 at [facility] and started on aspirin and Plavix for his stroke prophylaxis. Hemoglobin A1c was 11.2, LDL- 65, and triglycerides- 298. During that hospitalization, he had evidence of depression as well as urinary retention and a Foley catheter was placed. He was discharged to a skilled nursing facility for rehab. While at [facility] , patient had physical,occupational and speech therapy. On 02/08/2017, he had worsening of swallow function and was not able to swallow food or medications--no abnormal seizure type activity reported. He was sent to [facility] ED and admitted after MRI demonstrated small acute infarct in the right anterior thalamus, a subacute right ACA infarct, chronic microvascular ischemic changes advanced for age and chronic right occipital infarct  Patient transferred to [place] on 02/11/2017 .    Patient currently requires total with basic self-care skills secondary to muscle weakness, impaired timing and sequencing, abnormal tone, unbalanced muscle activation, motor apraxia, decreased coordination and decreased motor planning, decreased midline orientation, left side neglect and decreased motor planning, decreased initiation, decreased attention, decreased awareness, decreased problem solving, decreased safety awareness, decreased memory and delayed processing and decreased sitting balance, decreased standing balance, decreased postural control, hemiplegia and decreased balance strategies.  Prior to hospitalization, patient could complete ADLs with independent .  Patient will benefit from skilled intervention to decrease level of assist with basic self-care skills and increase independence with basic self-care skills prior to discharge home with care partner.  Anticipate  patient will require minimal physical assistance and follow up home  health.  OT - End of Session Activity Tolerance: Tolerates 30+ min activity with multiple rests Endurance Deficit: No OT Assessment Rehab Potential (ACUTE ONLY): Good OT Barriers to Discharge: Decreased caregiver support;Home environment access/layout;Lack of/limited family support OT Patient demonstrates impairments in the following area(s): Balance;Motor;Pain;Perception;Safety;Endurance;Vision;Sensory OT Basic ADL's Functional Problem(s): Grooming;Bathing;Dressing;Toileting;Eating OT Transfers Functional Problem(s): Toilet;Tub/Shower OT Additional Impairment(s): Fuctional Use of Upper Extremity OT Plan OT Intensity: Minimum of 1-2 x/day, 45 to 90 minutes OT Frequency: 5 out of 7 days OT Duration/Estimated Length of Stay: 21-23 days OT Treatment/Interventions: Balance/vestibular training;Discharge planning;Functional electrical stimulation;Pain management;Self Care/advanced ADL retraining;Therapeutic Activities;Therapeutic Exercise;Patient/family education;Functional mobility training;Cognitive remediation/compensation;Community reintegration;DME/adaptive equipment instruction;Neuromuscular re-education;UE/LE Strength taining/ROM;Wheelchair propulsion/positioning OT Self Feeding Anticipated Outcome(s): modified independent OT Basic Self-Care Anticipated Outcome(s): min assist OT Toileting Anticipated Outcome(s): min assist OT Bathroom Transfers Anticipated Outcome(s): min assist OT Recommendation Patient destination: Home (unsure as pt states wife works) Follow Up Recommendations: 24 hour supervision/assistance Equipment Recommended: 3 in 1 bedside comode   Skilled Therapeutic Intervention Began working on selfcare retraining sit to stand from the EOB.  Pt with increased constant lean to the left in sitting with occasional LOB to the left and posterior during selfcare tasks.  Flat affect but able to follow most one step commands.  Decreased integration of the LUE into bathing tasks with  pt using one handed technique with the RUE to open deodorant and soap bottle.  Max hand over hand to use the LUE to wash the right arm.  Decreased initiation for sit to stand but with max to total assist he was able to maintain standing.  Increased flexor tone noted in the left knee as well with decrease activation to keep the left heel on the floor.  Total assist for squat pivot transfer to the wheelchair on the left side. Pt left in wheelchair at end of session with call button in reach and safety belt in place.  Half lap tray also issued for LUE positioning.     OT Evaluation Precautions/Restrictions  Precautions Precautions: Fall Precaution Comments: slight pusher to the left, apraxia, decreased initiation, left hemiparesis Restrictions Weight Bearing Restrictions: No Therapy Vitals Temp: 98.3 F (36.8 C) Temp Source: Oral Pulse Rate: 73 Resp: 18 BP: (!) 167/75 Patient Position (if appropriate): Lying Oxygen Therapy SpO2: 97 % O2 Device: Not Delivered Pain Pain Assessment Pain Assessment: No/denies pain Pain Score: 0-No pain No report of pain during session  Home Living/Prior [place] expects to be discharged to:: Private residence Living Arrangements: Spouse/significant other, Children Available Help at Discharge:  (Pt reports that spouse works and is not sure if 24 hour is available) Type of Home: House Home Layout: Two level, 1/2 bath on main level Bathroom Shower/Tub: Chiropodist: Standard  Lives With: Spouse IADL History Current License: Yes Mode of Transportation: Car Occupation: Full time employment Leisure and Hobbies: None per pt report Prior Function Level of Independence: Independent with basic ADLs  Able to Take Stairs?: Yes Driving: Yes Vocation: Full time employment ADL  See Function Section of chart for details Vision Baseline Vision/History: Wears glasses Wears Glasses: At all times Patient Visual  Report: No change from baseline Vision Assessment?: Yes Eye Alignment: Within Functional Limits Ocular Range of Motion: Within Functional Limits Alignment/Gaze Preference: Within Defined Limits Tracking/Visual Pursuits: Decreased smoothness of vertical tracking;Decreased smoothness of horizontal tracking Saccades: Additional eye shifts occurred during testing Convergence: Impaired (comment) (convergenc not noted during  testing) Visual Fields: Impaired-to be further tested in functional context (Difficult to assess secondary to pt's decreased initiation to identify objects in field as they appeared.) Perception  Perception: Impaired Inattention/Neglect: Does not attend to left side of body Praxis Praxis: Impaired Praxis Impairment Details: Ideomotor Praxis-Other Comments: Pt with spontaneous movement of the LUE to scratch his face but demonstrated extreme difficulty getting it to move to command during testing. Cognition Overall Cognitive Status: Impaired/Different from baseline Arousal/Alertness: Awake/alert Orientation Level: Person;Place;Situation Person: Oriented Place: Oriented Situation: Oriented Year: 2018 Month: July Day of Week: Incorrect (Wednesday) Memory: Impaired Memory Impairment: Decreased recall of new information Immediate Memory Recall: Sock;Blue;Bed Memory Recall: Bed;Blue;Sock Memory Recall Sock: Without Cue Memory Recall Blue: Without Cue Memory Recall Bed: Without Cue Attention: Focused;Sustained Focused Attention: Appears intact Sustained Attention: Impaired Sustained Attention Impairment: Functional basic;Functional complex Awareness: Impaired Awareness Impairment: Intellectual impairment Problem Solving: Impaired Problem Solving Impairment: Functional basic Executive Function: Initiating;Sequencing Sequencing: Impaired Sequencing Impairment: Verbal complex Behaviors: Lability (Flat affect) Safety/Judgment: Impaired Sensation Sensation Light  Touch: Impaired Detail Light Touch Impaired Details: Absent LUE Stereognosis: Impaired Detail Stereognosis Impaired Details: Absent LUE Hot/Cold: Impaired Detail Hot/Cold Impaired Details: Absent LUE Proprioception: Impaired Detail Proprioception Impaired Details: Absent LUE Additional Comments: Pt unable to detect any sensation in the LUE during testing for light touch, proprioception, and deep pressure Coordination Gross Motor Movements are Fluid and Coordinated: No Fine Motor Movements are Fluid and Coordinated: No Coordination and Movement Description: Pt with severe motor apraxia with attempted use of the LUE.  He could spontaneously hold washcloth and wash the right arm with max assist but could not initiate gross digit flexion or extension consitently to command.  Motor  Motor Motor: Abnormal tone;Hemiplegia;Motor apraxia;Abnormal postural alignment and control Motor - Skilled Clinical Observations: increased lean to the left in sitting with resistance to therapist assisting him back to midline Mobility  Transfers Transfers: Sit to Stand;Stand to Sit Sit to Stand: 2: Max assist;With upper extremity assist;From bed Stand to Sit: 2: Max assist;With upper extremity assist;To bed  Trunk/Postural Assessment  Cervical Assessment Cervical Assessment: Exceptions to [facility] (cervical protraction at rest) Thoracic Assessment Thoracic Assessment: Exceptions to [facility] (left lateral trunk flexion at rest) Lumbar Assessment Lumbar Assessment: Exceptions to [facility] (maintains posterior pelvic tilt in sitting) Postural Control Postural Control: Deficits on evaluation Trunk Control: increased weight shift over the left hip in sittting with increased LOB to the left and posteriorly during selfcare tasks.   Balance Balance Balance Assessed: Yes Static Sitting Balance Static Sitting - Balance Support: Feet supported;No upper extremity supported Static Sitting - Level of Assistance: 4: Min  assist Dynamic Sitting Balance Dynamic Sitting - Balance Support: Feet supported;During functional activity Dynamic Sitting - Level of Assistance: 3: Mod assist Static Standing Balance Static Standing - Balance Support: Right upper extremity supported Static Standing - Level of Assistance: 2: Max assist Extremity/Trunk Assessment RUE Assessment RUE Assessment: Within Functional Limits LUE Assessment LUE impaired Pt with Brunnstrum stage V movement in the arm and hand with gross assessment.  Pt with spontaneous movement in digits and arm for bringing hand to face to scratch his chin, however he could not move the UE at the arm and hand to command.  Increased flexor tone noted in the left elbow with resistance to PROM.  Motor planning deficits present.    See Function Navigator for Current Functional Status.   Refer to Care Plan for Long Term Goals  Recommendations for other services: None    Discharge Criteria:  Patient will be discharged from OT if patient refuses treatment 3 consecutive times without medical reason, if treatment goals not met, if there is a change in medical status, if patient makes no progress towards goals or if patient is discharged from hospital.  The above assessment, treatment plan, treatment alternatives and goals were discussed and mutually agreed upon: by patient  Kahlil Cowans OTR/L 02/12/2017, 3:50 PM

## 2017-02-13 DIAGNOSIS — E119 Type 2 diabetes mellitus without complications: Secondary | ICD-10-CM

## 2017-02-13 DIAGNOSIS — I169 Hypertensive crisis, unspecified: Secondary | ICD-10-CM | POA: Insufficient documentation

## 2017-02-13 NOTE — Progress Notes (Signed)
Family requested 4 siderails up when patient is in bed.

## 2017-02-13 NOTE — Plan of Care (Signed)
Problem: RH BOWEL ELIMINATION Goal: RH STG MANAGE BOWEL WITH ASSISTANCE STG Manage Bowel with minimal [facility].   Outcome: Not Progressing Requires  2+ assist for transfers  Problem: RH BLADDER ELIMINATION Goal: RH STG MANAGE BLADDER WITH ASSISTANCE STG Manage Bladder incontinence With minimal Assistance   Outcome: Not Progressing Requires total assistance with incontinent episodes

## 2017-02-13 NOTE — Progress Notes (Signed)
Erick Colace, MD Physician Signed Physical Medicine and Rehabilitation  Consult Note Date of Service: 02/10/2017 2:42 PM  Related encounter: ED to Hosp-Admission (Discharged) from 02/08/2017 in [facility] 68[facility] NEURO MEDICAL     Expand All Collapse All   [] Hide copied text [] Hover for attribution information  Physical Medicine and Rehabilitation Consult Reason for Consult: Left-sided weakness Referring Phsyician: Rama Pax Reasoner is an 46 y.o. male.   HPI: 46 year old male with history of diabetes, hypertension, hyperlipidemia and medication noncompliance who had a right ACA stroke on 01/20/2017 and was admitted to [facility]. He was started on aspirin and Plavix for his CVA. Initial hemoglobin A1c was 11 point 2. LDL was 165, and triglycerides 298. During that hospitalization, he had evidence of depression as well as urinary retention and a Foley catheter was placed. He was discharged to a skilled nursing facility. While at [facility] skilled nursing facility, patient had physical and occupational and speech therapy. On 02/08/2017. Patient had worsening of swallow function and was not able to swallow food or medications.  Patient was sent to the emergency department at [facility] and was admitted after MRI demonstrated small acute infarct in the right anterior thalamus. A subacute right ACA infarct was also demonstrated. Chronic microvascular ischemic changes advanced for age were noted. Chronic right occipital infarct was noted as well. Cardiology evaluated the patient, recommended transesophageal echocardiogram.  Physical therapy evaluated patient, transfers were assessed, Plus to maximum assist level needed for stand pivot transfers, bed mobility was mod assist times 2, occupational therapy and demonstrated max assist times 2 for toileting Speech therapy noted some slow assessing decreased alternating attention, initially was nothing by mouth, but after further  evaluation. Regular diet, thin liquid and meds crushed in applesauce. Recommended by Speech Review of Systems  Constitutional: Negative for chills, fever and weight loss.  HENT: Negative for hearing loss and nosebleeds.   Eyes: Negative for double vision, pain and redness.  Respiratory: Negative for cough, sputum production, shortness of breath, wheezing and stridor.   Cardiovascular: Negative for chest pain and palpitations.  Gastrointestinal: Negative for abdominal pain, constipation, diarrhea, heartburn, nausea and vomiting.  Genitourinary: Negative for dysuria, frequency and urgency.  Musculoskeletal: Negative for back pain, joint pain and neck pain.  Skin: Negative for itching and rash.  Neurological: Positive for sensory change, speech change and focal weakness.  Psychiatric/Behavioral: Negative for hallucinations. The patient is not nervous/anxious.         Past Medical History:  Diagnosis Date  . Abnormal ECG   . Acute ischemic right ACA stroke (HCC)   . Coronary artery disease, occlusive   . Dysfunction of eustachian tube   . History of right ACA stroke   . Hyperlipidemia associated with type 2 diabetes mellitus (HCC)   . Hypertension   . Impotence of organic origin   . Lumbago   . Lymphadenitis    Unspecified, except mesenteric  . Type 2 diabetes, uncontrolled, with retinopathy (HCC)   . Urinary retention 01/2017   History reviewed. No pertinent surgical history.      Family History  Problem Relation Age of Onset  . Hypertension Mother   . Depression Mother   . Cancer Neg Hx   . Diabetes Neg Hx   . Stroke Neg Hx    Social History:  reports that he has never smoked. He has never used smokeless tobacco. He reports that he does not drink alcohol or use drugs. Allergies:  Allergies  Allergen Reactions  . Beta Adrenergic Blockers Other (See Comments)    "Fatigue," but this isn't noted on the patient's MAR         Medications Prior  to Admission  Medication Sig Dispense Refill  . aspirin 325 MG tablet Take 325 mg by mouth daily.    Marland Kitchen atorvastatin (LIPITOR) 80 MG tablet Take 80 mg by mouth at bedtime.     . bisacodyl (DULCOLAX) 10 MG suppository Place 10 mg rectally once as needed (FOR CONSTIPATION IF NO RELIEF FROM MILK OF MAGNESIA).    . cloNIDine (CATAPRES) 0.2 MG tablet Take 0.2 mg by mouth 3 (three) times daily.    . clopidogrel (PLAVIX) 75 MG tablet Take 75 mg by mouth daily.    Marland Kitchen diltiazem (CARDIZEM) 90 MG tablet Take 90 mg by mouth every 6 (six) hours.    Marland Kitchen escitalopram (LEXAPRO) 10 MG tablet Take 10 mg by mouth daily.    . insulin glargine (LANTUS) 100 UNIT/ML injection Inject 15 Units into the skin at bedtime.    . insulin lispro (HUMALOG) 100 UNIT/ML injection Inject 5 Units into the skin See admin instructions. THREE TIMES A DAY BEFORE EACH MEAL IF BGL >180 MG/DL    . magnesium hydroxide (MILK OF MAGNESIA) 400 MG/5ML suspension Take 30 mLs by mouth once as needed (FOR CONSTIPATION).    Marland Kitchen metFORMIN (GLUCOPHAGE) 1000 MG tablet Take 1,000 mg by mouth daily.    . nitroGLYCERIN (NITROSTAT) 0.4 MG SL tablet Place 0.4 mg under the tongue every 5 (five) minutes x 3 doses as needed for chest pain.     Marland Kitchen ondansetron (ZOFRAN) 4 MG tablet Take 4 mg by mouth every 4 (four) hours as needed for nausea or vomiting.    . Sodium Phosphates (RA SALINE ENEMA) 19-7 GM/118ML ENEM Place 1 enema rectally once as needed (FOR CONSTIPATION NOT RELIEVED BY DULCOLAX SUPPOSITORY AND CALL MD IF NO RELIEF FROM ENEMA).      Home: Home Living Family/patient expects to be discharged to:: Skilled nursing facility Additional Comments: Pt from [facility] prior to current admission  Functional History: Prior Function Comments: history obtained from notes; Prior to CVA in early July he was completely independent (has MBA and works in Engineer, manufacturing) Functional Status:  Mobility:  ADL: ADL Grooming Details (indicate  cue type and reason): mod A to sit EOB and perform grooming as Pt requires assist from RUE for seated balance Toilet Transfer Details (indicate cue type and reason): simulated through recliner transfer. Pt has foley  Cognition: Cognition Overall Cognitive Status: Impaired/Different from baseline Orientation Level: Oriented X4 Cognition Arousal/Alertness: Awake/alert Behavior During Therapy: Flat affect Overall Cognitive Status: Impaired/Different from baseline Area of Impairment: Attention, Following commands, Safety/judgement, Awareness Current Attention Level: Sustained Following Commands: Follows one step commands inconsistently, Follows one step commands with increased time Safety/Judgement: Decreased awareness of safety, Decreased awareness of deficits Awareness: Intellectual  Blood pressure (!) 189/84, pulse 90, temperature 98.9 F (37.2 C), temperature source Oral, resp. rate 20, height 6\' 2"  (1.88 m), weight 103.7 kg (228 lb 11.2 oz), SpO2 97 %. Physical Exam  Nursing note and vitals reviewed. Constitutional: He is oriented to person, place, and time. He appears well-developed and well-nourished. No distress.  HENT:  Head: Normocephalic and atraumatic.  Eyes: Pupils are equal, round, and reactive to light. Conjunctivae are normal. Right eye exhibits no discharge. Left eye exhibits no discharge. No scleral icterus.  Neck: No JVD present.  Respiratory: No stridor.  Neurological: He is  alert and oriented to person, place, and time. A sensory deficit is present. Coordination and gait abnormal.  Left upper and left lower limb with reduced sensation. Left neglect noted on confrontation testing, but visual fields are intact.  Motor strength is 5/5 in the right deltoid, biceps, wrist and grip, hip flexion, knee extension, ankle dorsi flexion. Left upper extremity has 3 minus strength, difficulty with volitional movement. Left lower extremity has 0/5 strength. Mild triple flexor  reflex   Skin: He is not diaphoretic.  Psychiatric: His affect is blunt. His speech is delayed. He is slowed and withdrawn. He does not express impulsivity.  Oriented to person, place and time, as well as situation. He is inattentive.    Lab Results Last 24 Hours       Results for orders placed or performed during the hospital encounter of 02/08/17 (from the past 24 hour(s))  Glucose, capillary     Status: Abnormal   Collection Time: 02/09/17  5:04 PM  Result Value Ref Range   Glucose-Capillary 120 (H) 65 - 99 mg/dL  Glucose, capillary     Status: Abnormal   Collection Time: 02/09/17  9:55 PM  Result Value Ref Range   Glucose-Capillary 124 (H) 65 - 99 mg/dL   Comment 1 Notify RN    Comment 2 Document in Chart   CBC     Status: Abnormal   Collection Time: 02/10/17  4:09 AM  Result Value Ref Range   WBC 5.2 4.0 - 10.5 K/uL   RBC 4.36 4.22 - 5.81 MIL/uL   Hemoglobin 11.5 (L) 13.0 - 17.0 g/dL   HCT 16.1 (L) 09.6 - 04.5 %   MCV 79.4 78.0 - 100.0 fL   MCH 26.4 26.0 - 34.0 pg   MCHC 33.2 30.0 - 36.0 g/dL   RDW 40.9 81.1 - 91.4 %   Platelets 296 150 - 400 K/uL  Basic metabolic panel     Status: Abnormal   Collection Time: 02/10/17  4:09 AM  Result Value Ref Range   Sodium 134 (L) 135 - 145 mmol/L   Potassium 3.8 3.5 - 5.1 mmol/L   Chloride 100 (L) 101 - 111 mmol/L   CO2 25 22 - 32 mmol/L   Glucose, Bld 98 65 - 99 mg/dL   BUN 20 6 - 20 mg/dL   Creatinine, Ser 7.82 (H) 0.61 - 1.24 mg/dL   Calcium 8.7 (L) 8.9 - 10.3 mg/dL   GFR calc non Af Amer 49 (L) >60 mL/min   GFR calc Af Amer 57 (L) >60 mL/min   Anion gap 9 5 - 15  Glucose, capillary     Status: Abnormal   Collection Time: 02/10/17  6:42 AM  Result Value Ref Range   Glucose-Capillary 102 (H) 65 - 99 mg/dL   Comment 1 Notify RN    Comment 2 Document in Chart   Glucose, capillary     Status: Abnormal   Collection Time: 02/10/17 11:26 AM  Result Value Ref Range   Glucose-Capillary  161 (H) 65 - 99 mg/dL      Imaging Results (Last 48 hours)  Ct Angio Head W Or Wo Contrast  Result Date: 02/08/2017 CLINICAL DATA:  Initial evaluation for acute discs are. EXAM: CT ANGIOGRAPHY HEAD AND NECK TECHNIQUE: Multidetector CT imaging of the head and neck was performed using the standard protocol during bolus administration of intravenous contrast. Multiplanar CT image reconstructions and MIPs were obtained to evaluate the vascular anatomy. Carotid stenosis measurements (when applicable) are  obtained utilizing NASCET criteria, using the distal internal carotid diameter as the denominator. CONTRAST:  50 cc of Isovue 370. COMPARISON:  Prior CT from earlier same day. FINDINGS: CTA NECK FINDINGS Aortic arch: Visualized aortic arch of normal caliber with normal 3 vessel morphology. No flow-limiting stenosis about the origin of the great vessels. Minimal plaque noted within the proximal descending intrathoracic aorta. Visualized subclavian artery is widely patent. Right carotid system: Right common and internal carotid artery's widely patent without stenosis, dissection, or occlusion. Left carotid system: Left common and internal carotid artery's widely patent without stenosis, dissection, or occlusion. Vertebral arteries: Both of the vertebral arteries arise from the subclavian arteries. Left vertebral artery slightly dominant. Vertebral arteries widely patent within the neck without stenosis, dissection, or occlusion. Skeleton: No acute osseus abnormality. No worrisome lytic or blastic osseous lesions. Other neck: No acute soft tissue abnormality within the neck. Salivary glands normal. Thyroid normal. No adenopathy. Upper chest: Visualized upper chest within normal limits. Partially visualized lungs are clear. Review of the MIP images confirms the above findings CTA HEAD FINDINGS Anterior circulation: Petrous, cavernous, and supraclinoid segments of the internal carotid arteries are widely patent  without flow-limiting stenosis. ICA termini widely patent. A1 segments patent bilaterally. Anterior communicating artery normal. Multifocal irregularity within the A2 segments bilaterally with moderate to severe stenoses (Series 7, image 75, 72), likely atheromatous. ACA is are patent to their distal aspects. M1 segments patent bilaterally without stenosis or occlusion MCA bifurcations normal. No proximal M2 occlusion. Distal MCA branches demonstrate atheromatous irregularity but are patent and symmetric bilaterally. Posterior circulation: Focal plaque with severe left V4 stenosis (series 8, image 123). Left vertebral artery patent distally to the vertebrobasilar junction. Focal severe stenosis within the diminutive right vertebral artery (series 8, image 123). Right vertebral artery otherwise patent to the vertebrobasilar junction. Posterior inferior cerebral arteries not well evaluated on this exam. Vertebrobasilar junction normal. Basilar artery widely patent to its distal aspect. Superior cerebral arteries patent bilaterally. Posterior cerebral arteries largely supplied via the basilar artery. Short-segment moderate mid left P2 stenosis (series 8, image 123). Left PCA irregular but patent 2 its distal aspects. Right PCA somewhat attenuated and irregular with moderate to severe multifocal stenoses involving the right P2 segment (series 8, image 128). Right PCA also patent to its distal aspect. Small left posterior communicating artery noted. Venous sinuses: Not well evaluated on this exam due to arterial bolus timing. Anatomic variants: None.  No aneurysm or vascular malformation. Delayed phase: No pathologic enhancement. Subacute to chronic right ACA territory infarcts again noted. Additional remote infarcts involving the right PCA territory. Remote right basal ganglia infarcts. Right maxillary sinus disease. Review of the MIP images confirms the above findings IMPRESSION: 1. Negative CTA for emergent large  vessel occlusion. 2. Bilateral severe V4 stenoses as above. 3. Multifocal moderate to severe atheromatous is stenoses involving the bilateral A2 and P2 segments as above, most severe within the right PCA. 4. Negative CTA of the neck without flow-limiting stenosis or significant atheromatous disease. Electronically Signed   By: Rise Mu M.D.   On: 02/08/2017 20:36   Dg Chest 2 View  Result Date: 02/08/2017 CLINICAL DATA:  Recent TIA EXAM: CHEST  2 VIEW COMPARISON:  None. FINDINGS: Cardiac shadow is mildly prominent but accentuated by the frontal technique. No focal infiltrate or sizable effusion is seen. No bony abnormality is noted. IMPRESSION: No active cardiopulmonary disease. Electronically Signed   By: Alcide Clever M.D.   On: 02/08/2017 22:26  Ct Angio Neck W And/or Wo Contrast  Result Date: 02/08/2017 CLINICAL DATA:  Initial evaluation for acute discs are. EXAM: CT ANGIOGRAPHY HEAD AND NECK TECHNIQUE: Multidetector CT imaging of the head and neck was performed using the standard protocol during bolus administration of intravenous contrast. Multiplanar CT image reconstructions and MIPs were obtained to evaluate the vascular anatomy. Carotid stenosis measurements (when applicable) are obtained utilizing NASCET criteria, using the distal internal carotid diameter as the denominator. CONTRAST:  50 cc of Isovue 370. COMPARISON:  Prior CT from earlier same day. FINDINGS: CTA NECK FINDINGS Aortic arch: Visualized aortic arch of normal caliber with normal 3 vessel morphology. No flow-limiting stenosis about the origin of the great vessels. Minimal plaque noted within the proximal descending intrathoracic aorta. Visualized subclavian artery is widely patent. Right carotid system: Right common and internal carotid artery's widely patent without stenosis, dissection, or occlusion. Left carotid system: Left common and internal carotid artery's widely patent without stenosis, dissection, or  occlusion. Vertebral arteries: Both of the vertebral arteries arise from the subclavian arteries. Left vertebral artery slightly dominant. Vertebral arteries widely patent within the neck without stenosis, dissection, or occlusion. Skeleton: No acute osseus abnormality. No worrisome lytic or blastic osseous lesions. Other neck: No acute soft tissue abnormality within the neck. Salivary glands normal. Thyroid normal. No adenopathy. Upper chest: Visualized upper chest within normal limits. Partially visualized lungs are clear. Review of the MIP images confirms the above findings CTA HEAD FINDINGS Anterior circulation: Petrous, cavernous, and supraclinoid segments of the internal carotid arteries are widely patent without flow-limiting stenosis. ICA termini widely patent. A1 segments patent bilaterally. Anterior communicating artery normal. Multifocal irregularity within the A2 segments bilaterally with moderate to severe stenoses (Series 7, image 75, 72), likely atheromatous. ACA is are patent to their distal aspects. M1 segments patent bilaterally without stenosis or occlusion MCA bifurcations normal. No proximal M2 occlusion. Distal MCA branches demonstrate atheromatous irregularity but are patent and symmetric bilaterally. Posterior circulation: Focal plaque with severe left V4 stenosis (series 8, image 123). Left vertebral artery patent distally to the vertebrobasilar junction. Focal severe stenosis within the diminutive right vertebral artery (series 8, image 123). Right vertebral artery otherwise patent to the vertebrobasilar junction. Posterior inferior cerebral arteries not well evaluated on this exam. Vertebrobasilar junction normal. Basilar artery widely patent to its distal aspect. Superior cerebral arteries patent bilaterally. Posterior cerebral arteries largely supplied via the basilar artery. Short-segment moderate mid left P2 stenosis (series 8, image 123). Left PCA irregular but patent 2 its distal  aspects. Right PCA somewhat attenuated and irregular with moderate to severe multifocal stenoses involving the right P2 segment (series 8, image 128). Right PCA also patent to its distal aspect. Small left posterior communicating artery noted. Venous sinuses: Not well evaluated on this exam due to arterial bolus timing. Anatomic variants: None.  No aneurysm or vascular malformation. Delayed phase: No pathologic enhancement. Subacute to chronic right ACA territory infarcts again noted. Additional remote infarcts involving the right PCA territory. Remote right basal ganglia infarcts. Right maxillary sinus disease. Review of the MIP images confirms the above findings IMPRESSION: 1. Negative CTA for emergent large vessel occlusion. 2. Bilateral severe V4 stenoses as above. 3. Multifocal moderate to severe atheromatous is stenoses involving the bilateral A2 and P2 segments as above, most severe within the right PCA. 4. Negative CTA of the neck without flow-limiting stenosis or significant atheromatous disease. Electronically Signed   By: Rise Mu M.D.   On: 02/08/2017 20:36  Mr Brain Wo Contrast  Result Date: 02/09/2017 CLINICAL DATA:  46 y/o M; acute dysphagia. Right ACA stroke earlier this month. EXAM: MRI HEAD WITHOUT CONTRAST TECHNIQUE: Multiplanar, multiecho pulse sequences of the brain and surrounding structures were obtained without intravenous contrast. COMPARISON:  02/08/2017 CT angiogram of the head. FINDINGS: Brain: Right ACA distribution diffusion hyperintensity with few areas of mildly reduced diffusion, T2 FLAIR hyperintense signal abnormality, and local mass effect compatible with subacute infarction. Subcentimeter focus of clearly reduced diffusion within the right anterior thalamus compatible with acute infarction. Background of foci of T2 FLAIR hyperintense signal abnormality in white matter compatible with chronic microvascular ischemic changes, advanced for age and brain parenchymal  volume loss. Chronic infarction within the right occipital lobe. No herniation or acute hemorrhage identified. Vascular: Normal flow voids. Skull and upper cervical spine: Normal marrow signal. Sinuses/Orbits: Right frontal, anterior ethmoid, and maxillary sinus opacification, right middle meatus obstructive pattern. No abnormal signal of mastoid air cells. Orbits are unremarkable. Other: None. IMPRESSION: 1. Subcentimeter acute infarction within right anterior thalamus. No acute hemorrhage. 2. Right ACA distribution subacute infarction. Mild local mass effect. No herniation. 3. Background of chronic microvascular ischemic changes and parenchymal volume loss of the brain, advanced for age. 4. Chronic right occipital infarction. 5. Paranasal sinus disease with a right middle meatus obstructive pattern, direct visualization recommended. These results will be called to the ordering clinician or representative by the Radiologist Assistant, and communication documented in the PACS or zVision Dashboard. Electronically Signed   By: Mitzi HansenLance  Furusawa-Stratton M.D.   On: 02/09/2017 06:36   Ct Head Code Stroke W/o Cm  Result Date: 02/08/2017 CLINICAL DATA:  Code stroke. New onset dysphagia. Previous ACA territory infarct. Left hemi paresis. EXAM: CT HEAD WITHOUT CONTRAST TECHNIQUE: Contiguous axial images were obtained from the base of the skull through the vertex without intravenous contrast. COMPARISON:  None. FINDINGS: Brain: A subacute/early chronic right ACA territory infarct is noted. There is some volume loss. No associated hemorrhage is present. A more remote medial and inferior right occipital lobe infarct is present. Ischemic changes within the right internal capsule appear remote as well. No acute cortical infarct, hemorrhage, or mass lesion is present. The basal ganglia are otherwise intact. The insular ribbon is normal. No other focal cortical lesions are present. The ventricles are of normal size. No  significant extra-axial fluid collection is present. Vascular: No hyperdense vessel or unexpected calcification. Skull: The calvarium is intact. No focal lytic or blastic lesions are present. Sinuses/Orbits: The right maxillary sinus is opacified. The ostiomeatal complex is occluded. Anterior right ethmoid and frontal sinus opacification is noted as well. The left paranasal sinuses and bilateral sphenoid sinuses are clear. The mastoid air cells are clear. ASPECTS [facility]([place] Stroke Program Early CT Score) - Ganglionic level infarction (caudate, lentiform nuclei, internal capsule, insula, M1-M3 cortex): 7/7 - Supraganglionic infarction (M4-M6 cortex): 3/3 Total score (0-10 with 10 being normal): 10/10 IMPRESSION: 1. Subacute to early chronic right ACA territory infarcts without hemorrhage. There is some volume loss. 2. More remote infarct in the inferior medial right occipital lobe. 3. Remote infarct of the right internal capsule. 4. No acute infarct. 5. Extensive anterior right-sided sinus disease 6. ASPECTS is 10/10 The These results were called by telephone at the time of interpretation on 02/08/2017 at 4:27 pm to Dr. Wilford CornerARORA, who verbally acknowledged these results. Electronically Signed   By: Marin Robertshristopher  Mattern M.D.   On: 02/08/2017 16:28     Assessment/Plan: Diagnosis: Left hemiparesis, left neglect  secondary to right ACA infarct, left hemisensory deficits made in addition be related to the small right thalamic infarct 1. Does the need for close, 24 hr/day medical supervision in concert with the patient's rehab needs make it unreasonable for this patient to be served in a less intensive setting? Yes 2. Co-Morbidities requiring supervision/potential complications: Diabetes, hypertension 3. Due to bladder management, bowel management, safety, skin/wound care, disease management, medication administration, pain management and patient education, does the patient require 24 hr/day rehab nursing? Yes 4. Does  the patient require coordinated care of a physician, rehab nurse, PT (1-2 hrs/day, 5 days/week), OT (1-2 hrs/day, 5 days/week) and SLP (.5-1 hrs/day, 5 days/week) to address physical and functional deficits in the context of the above medical diagnosis(es)? Yes Addressing deficits in the following areas: balance, endurance, locomotion, strength, transferring, bowel/bladder control, bathing, dressing, feeding, grooming, toileting, cognition, speech, swallowing and psychosocial support 5. Can the patient actively participate in an intensive therapy program of at least 3 hrs of therapy per day at least 5 days per week? Yes 6. The potential for patient to make measurable gains while on inpatient rehab is excellent 7. Anticipated functional outcomes upon discharge from inpatients are min assist PT, min assist OT, min assist. Cognition, mod I swallow SLP 8. Estimated rehab length of stay to reach the above functional goals is: 21-25 days 9. Does the patient have adequate social supports to accommodate these discharge functional goals? Yes 10. Anticipated D/C setting: Home 11. Anticipated post D/C treatments: HH therapy 12. Overall Rehab/Functional Prognosis: good  RECOMMENDATIONS: This patient's condition is appropriate for continued rehabilitative care in the following setting: CIR Patient has agreed to participate in recommended program. Yes Note that insurance prior authorization may be required for reimbursement for recommended care.  Comment:   Erick ColaceKIRSTEINS,ANDREW E 02/10/2017     Routing History

## 2017-02-13 NOTE — Progress Notes (Signed)
[facility] PHYSICAL MEDICINE & REHABILITATION     PROGRESS NOTE  Subjective/Complaints:  Pt seen laying in bed this AM.  He states he slept well overnight and had a good first day of therapies yesterday.   ROS: Denies CP, SOB, nausea, vomiting, diarrhea.  Objective: Vital Signs: Blood pressure (!) 181/81, pulse 73, temperature 98.4 F (36.9 C), temperature source Oral, resp. rate 18, height 6\' 2"  (1.88 m), weight 102.1 kg (225 lb 1 oz), SpO2 99 %. No results found.  Recent Labs  02/12/17 0521  WBC 5.9  HGB 12.4*  HCT 36.2*  PLT 279    Recent Labs  02/12/17 0521  NA 134*  K 4.0  CL 100*  GLUCOSE 90  BUN 13  CREATININE 1.30*  CALCIUM 8.5*   CBG (last 3)   Recent Labs  02/10/17 2207 02/11/17 0636 02/11/17 1818  GLUCAP 145* 115* 82    Wt Readings from Last 3 Encounters:  02/12/17 102.1 kg (225 lb 1 oz)  02/08/17 103.7 kg (228 lb 11.2 oz)  02/08/17 124.3 kg (274 lb)    Physical Exam:  BP (!) 181/81 (BP Location: Right Arm)   Pulse 73   Temp 98.4 F (36.9 C) (Oral)   Resp 18   Ht 6\' 2"  (1.88 m)   Wt 102.1 kg (225 lb 1 oz)   SpO2 99%   BMI 28.90 kg/m  Constitutional: He appears well-developedand well-nourished.  HENT: Normocephalicand atraumatic.  Eyes: EOMI. No discharge. Cardiovascular: RRR. No JVD. Respiratory: Effort normal breath sounds normal.  GI: Soft. Bowel sounds are normal.   Neurological: He is alertand oriented.  Left facial weakness with left gaze preference and right neglect, improving.  Delayed processing with slow speech. His attention is poor and he lacks awareness/insight into deficits. Spastic left hemiparesis  Motor: Left lower extremity: 2+/5 proximal to distal Skin: Skin is warmand dry.  Psychiatric: His affect is blunt. His speech is delayed. He is slowed. He expresses inappropriate judgment. He is inattentive.    Assessment/Plan: 1. 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, small right thalamic infarct which  require 3+ hours per day of interdisciplinary therapy in a comprehensive inpatient rehab setting. Physiatrist is providing close team supervision and 24 hour management of active medical problems listed below. Physiatrist and rehab team continue to assess barriers to discharge/monitor patient progress toward functional and medical goals.  Function:  Bathing Bathing position   Position: Sitting EOB  Bathing parts Body parts bathed by patient: Left arm, Chest, Abdomen, Front perineal area, Right upper leg, Left upper leg Body parts bathed by helper: Right lower leg, Left lower leg, Buttocks, Right arm, Back  Bathing assist Assist Level: 2 helpers      Upper Body Dressing/Undressing Upper body dressing   What is the patient wearing?: Hospital gown                Upper body assist        Lower Body Dressing/Undressing Lower body dressing   What is the patient wearing?: Non-skid slipper socks           Non-skid slipper socks- Performed by helper: Don/doff right sock, Don/doff left sock                  Lower body assist Assist for lower body dressing: 2 Helpers      Toileting Toileting          Toileting assist     Transfers Chair/bed transfer   Chair/bed  transfer method: Squat pivot Chair/bed transfer assist level: 2 helpers       Locomotion Ambulation Ambulation activity did not occur: Safety/medical Investment banker, operationalconcerns         Wheelchair Wheelchair activity did not occur: Safety/medical concerns Type: Manual      Cognition Comprehension Comprehension assist level: Understands basic 90% of the time/cues < 10% of the time  Expression Expression assist level: Expresses basic 75 - 89% of the time/requires cueing 10 - 24% of the time. Needs helper to occlude trach/needs to repeat words.  Social Interaction Social Interaction assist level: Interacts appropriately 50 - 74% of the time - May be physically or verbally inappropriate.  Problem Solving Problem  solving assist level: Solves basic 25 - 49% of the time - needs direction more than half the time to initiate, plan or complete simple activities  Memory Memory assist level: Recognizes or recalls 75 - 89% of the time/requires cueing 10 - 24% of the time    Medical Problem List and Plan: 1.  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  DVT Prophylaxis/Anticoagulation: Pharmaceutical: Lovenox 3. H/o lumbago/Pain Management: Denies pain at this moment. Tylenol prn 4. Mood: LCSW to follow for evaluation and support.  5. Neuropsych: This patient is not capable of making decisions on his  own behalf. 6. Skin/Wound Care: routine pressure relief measures 7. Fluids/Electrolytes/Nutrition: Monitor I/Os--offer supplements if intake poor.   8.T2DM: monitor BS ac/hs. Continue lantus daily with meal coverage. Will use SSI for elevated BS.   Relatively controlled at present 9. HTN:  Monitor BP bid  Norvasc 5mg  daily, increased to 7.5 on 7/28  Remains elevated, will consider further increase tomorrow  10 H/o urinary retention: currently incontinent. Toilet patient every 4 hours and cath if retention noted.   11. Hyperlipidemia: On atorvastatin.  12. Question CKD: baseline SCr around 1.6 per records review. Continue to monitor.  Offer fluids between meals for adequate hydration.   Cr. 1.30 on 7/28  Cont to monitor 13. Hyponatremia  Sodium 134 on 7/28  Continue to monitor 14. Hypoalbuminemia  Supplement initiated 7/28 15. Acute blood loss anemia  Hemoglobin 12.4 on 7/28  Continue to monitor  LOS (Days) 2 A FACE TO FACE EVALUATION WAS PERFORMED  Mihran Lebarron Karis Jubanil Clairessa Boulet 02/13/2017 7:04 AM

## 2017-02-13 NOTE — Progress Notes (Signed)
[name] Admission Coordinator Signed Physical Medicine and Rehabilitation  PMR Pre-admission Date of Service: 02/11/2017 3:52 PM  Related encounter: ED to Hosp-Admission (Discharged) from 02/08/2017 in [facility] 51M NEURO MEDICAL       [] Hide copied text PMR Admission Coordinator Pre-Admission Assessment  Patient: Frank Moss is an 46 y.o., male MRN: [id number] DOB: October 05, 1970 Height: 6\' 2"  (188 cm) Weight: 103.7 kg (228 lb 11.2 oz)                                                                                                                                                  Insurance Information HMO:     PPO: X     PCP:      IPA:      80/20:      OTHER:  PRIMARY: [employer]      Policy#: [id number]      Subscriber: Spouse CM Name: Mallie Snooks      Phone#: [phone] [id number]     Fax#: [phone] Pre-Cert#: [id number]  02/11/17-02/17/17, faxed clinical updates due 02/17/17    Employer: [employer] Benefits:  Phone #: [phone]     Name: Janeice Robinson. Date: 07/19/12     Deduct: $700      Out of Pocket Max: $4,000      Life Max: N/A CIR: $100 co-pay per admission; 10% co-insurance       SNF: 10% co-insurance  Outpatient: PT/OT/SLP     Co-Pay: $20 Home Health: PT/OT/SLP      Co-Pay: 10% co-insurance  DME:      Co-Pay: 10% co-insurance  Providers: In-network  Medicaid Application Date:       Case Manager:  Disability Application Date:       Case Worker:   Emergency Actuary Information    Name Relation Home Work Mobile   Silva,[name] Spouse [phone]  [phone]     Current Medical History  Patient Admitting Diagnosis:Left hemiparesis, left neglect secondary to right ACA infarct, left hemisensory deficits made in addition be related to the small right thalamic infarct.   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 He was admitted to [facility] and started on aspirin and Plavix for his stroke prophylaxis. Hemoglobin A1c was 11.2, LDL- 65, and triglycerides- 298. During that hospitalization, he had evidence of depression as well as urinary retention and a Foley catheter was placed. He was discharged to a skilled nursing facility for rehab. While at [facility] , patient had physical,occupational and speech therapy. On 02/08/2017, he had worsening of swallow function and was not able to swallow food or medications--no abnormal seizure type activity reported. He was sent to [facility]  ED and admitted after MRI demonstrated small acute infarct in the right anterior thalamus, a subacute right ACA infarct, chronic microvascular ischemic changes advanced for age and chronic right occipital infarct. Cardiology evaluated the patient, recommended TEE. This was performed 7/27 and revealed normal LVF, no shunt, no thrombus or vegetation and trivial pericardial effusion. Stroke felt to be embolic due to severe A2 and P2 segment atheromatous stenosis, advanced SVD and poorly controlled diabetes. Cardiology recommending 30 day heart monitor over loop recorder. Therapy evaluation revealed need for Huntley Dec Plus to maximum assist level needed for stand pivot transfers, bed mobility was mod assist X2 and occupational therapy and demonstrated max assist X2 for toileting. He also demonstrated slow processing with decreased attention and no s/s of aspiration seen therefore on regular diet with thin liquids. Patient admitted to IP Rehab 02/11/17.   NIH Total: 3  Past Medical History      Past Medical History:  Diagnosis Date  . Abnormal ECG   . Acute ischemic right ACA stroke (HCC)   . Coronary artery disease, occlusive   . Dysfunction of eustachian tube   . History of right ACA stroke   . Hyperlipidemia associated with type 2 diabetes mellitus (HCC)   . Hypertension     . Impotence of organic origin   . Lumbago   . Lymphadenitis    Unspecified, except mesenteric  . Type 2 diabetes, uncontrolled, with retinopathy (HCC)   . Urinary retention 01/2017    Family History  family history includes Depression in his mother; Hypertension in his mother.  Prior Rehab/Hospitalizations:  Has the patient had major surgery during 100 days prior to admission? No  Current Medications   Current Facility-Administered Medications:  .  acetaminophen (TYLENOL) tablet 650 mg, 650 mg, Oral, Q4H PRN, 650 mg at 02/09/17 2257 **OR** acetaminophen (TYLENOL) solution 650 mg, 650 mg, Per Tube, Q4H PRN **OR** acetaminophen (TYLENOL) suppository 650 mg, 650 mg, Rectal, Q4H PRN, Jonah Blue, MD .  amLODipine (NORVASC) tablet 5 mg, 5 mg, Oral, Daily, Rama, Maryruth Bun, MD .  aspirin EC tablet 325 mg, 325 mg, Oral, Daily, Jonah Blue, MD, 325 mg at 02/10/17 0936 .  atorvastatin (LIPITOR) tablet 80 mg, 80 mg, Oral, QHS, Jonah Blue, MD, 80 mg at 02/10/17 2209 .  bisacodyl (DULCOLAX) suppository 10 mg, 10 mg, Rectal, Once PRN, Jonah Blue, MD .  clopidogrel (PLAVIX) tablet 75 mg, 75 mg, Oral, Daily, Jonah Blue, MD, 75 mg at 02/10/17 0936 .  enoxaparin (LOVENOX) injection 40 mg, 40 mg, Subcutaneous, Q24H, Jonah Blue, MD, 40 mg at 02/10/17 2208 .  escitalopram (LEXAPRO) tablet 10 mg, 10 mg, Oral, Daily, Jonah Blue, MD, 10 mg at 02/10/17 0936 .  insulin aspart (novoLOG) injection 0-15 Units, 0-15 Units, Subcutaneous, TID WC, Vann, Jessica U, DO, 3 Units at 02/10/17 1742 .  insulin aspart (novoLOG) injection 0-5 Units, 0-5 Units, Subcutaneous, QHS, Vann, Jessica U, DO .  insulin glargine (LANTUS) injection 15 Units, 15 Units, Subcutaneous, QHS, Jonah Blue, MD, 15 Units at 02/10/17 2209 .  senna-docusate (Senokot-S) tablet 1 tablet, 1 tablet, Oral, QHS PRN, Jonah Blue, MD .  sodium phosphate (FLEET) 7-19 GM/118ML enema 1 enema, 1 enema,  Rectal, Once PRN, Jonah Blue, MD  Patients Current Diet: Diet - low sodium heart healthy Diet Carb Modified  Precautions / Restrictions Precautions Precautions: Fall Restrictions Weight Bearing Restrictions: No   Has the patient had 2 or more falls or a fall with injury in the past year?Yes since his  initial CVA in SNF and hospital   Prior Activity Level Community (5-7x/wk): Prior to initial stroke on 01/20/17 patient was fully independent and working full time.   Home Assistive Devices / Equipment Home Assistive Devices/Equipment: Wheelchair  Prior Device Use: Indicate devices/aids used by the patient prior to current illness, exacerbation or injury? None of the above  Prior Functional Level Prior Function Level of Independence: Needs assistance Gait / Transfers Assistance Needed: unable to sit to stand alone ADL's / Homemaking Assistance Needed: assist for all ADL Comments: history obtained from notes; Prior to CVA in early July he was completely independent (has MBA and works in Engineer, manufacturing)  Self Care: Did the patient need help bathing, dressing, using the toilet or eating? Independent  Indoor Mobility: Did the patient need assistance with walking from room to room (with or without device)? Independent  Stairs: Did the patient need assistance with internal or external stairs (with or without device)? Independent  Functional Cognition: Did the patient need help planning regular tasks such as shopping or remembering to take medications? Independent  Current Functional Level Cognition  Overall Cognitive Status: Impaired/Different from baseline Current Attention Level: Sustained Orientation Level: Oriented X4 Following Commands: Follows one step commands inconsistently, Follows one step commands with increased time Safety/Judgement: Decreased awareness of safety, Decreased awareness of deficits    Extremity Assessment (includes Sensation/Coordination)   Upper Extremity Assessment: LUE deficits/detail LUE Deficits / Details: Pt grossly 2/5, able to make fist - but unable to hold. Pt reports no difference in sensory, however MD neurology states sensory loss on left LUE Sensation: decreased light touch LUE Coordination: decreased fine motor, decreased gross motor  Lower Extremity Assessment: LLE deficits/detail, Difficult to assess due to impaired cognition LLE Deficits / Details: noted weakness LLE grossly 3/5 LLE Sensation: decreased light touch LLE Coordination: decreased fine motor, decreased gross motor    ADLs  Overall ADL's : Needs assistance/impaired Eating/Feeding: NPO Grooming: Wash/dry hands, Minimal assistance, Sitting Grooming Details (indicate cue type and reason): mod A to sit EOB and perform grooming as Pt requires assist from RUE for seated balance Upper Body Bathing: Maximal assistance Lower Body Bathing: Maximal assistance Upper Body Dressing : Maximal assistance Lower Body Dressing: Maximal assistance Toilet Transfer: Maximal assistance, +2 for physical assistance, +2 for safety/equipment, Stand-pivot, BSC Toilet Transfer Details (indicate cue type and reason): simulated through recliner transfer. Pt has foley Toileting- Clothing Manipulation and Hygiene: Total assistance Functional mobility during ADLs: Maximal assistance, +2 for physical assistance, +2 for safety/equipment, Cueing for safety, Cueing for sequencing    Mobility  Overal bed mobility: Needs Assistance Bed Mobility: Rolling, Sidelying to Sit Rolling: Mod assist, +2 for safety/equipment Sidelying to sit: Mod assist, +2 for physical assistance General bed mobility comments: Pt required multimodal cueing for sequencing, and mod +2 assist for rolling and trunk elevation    Transfers  Overall transfer level: Needs assistance Equipment used: 2 person hand held assist Transfers: Sit to/from Stand, Stand Pivot Transfers Sit to Stand: Mod assist, +2  physical assistance Stand pivot transfers: Max assist, +2 physical assistance, +2 safety/equipment General transfer comment: sit<>stand x2 from bed with blocking out of LLE and vc to bring body weight to the right. multimodal cues during stand pivot with max A +2 to the left, Pt with heavy lean to the left during transfers    Ambulation / Gait / Stairs / Wheelchair Mobility       Posture / Balance Dynamic Sitting Balance Sitting balance - Comments: cues to use  RUE to hold on at bedrail, when holding on, able to sit min guard - with no UE support Pt is a mod A to maintain sitting EOB Balance Overall balance assessment: Needs assistance Sitting-balance support: Single extremity supported, Feet supported Sitting balance-Leahy Scale: Poor Sitting balance - Comments: cues to use RUE to hold on at bedrail, when holding on, able to sit min guard - with no UE support Pt is a mod A to maintain sitting EOB Postural control: Left lateral lean, Posterior lean Standing balance support: Single extremity supported Standing balance-Leahy Scale: Poor Standing balance comment: reliant on support from 2 therapists to remain standing, heavy left lean, blocking on Left knee    Special needs/care consideration BiPAP/CPAP: CPAP at home PTA CPM: No Continuous Drip IV: No Dialysis: No        Life Vest: No Oxygen: No Special Bed: No Trach Size: No Wound Vac (area): No       Skin: Abrasion right lower leg                               Bowel mgmt: Continent 02/10/17 Bladder mgmt: Incontinent  Diabetic mgmt: [id number]     [facility]: No Additional Comments: [facility] prior to current admission  Discharge Living Setting Plans for Discharge Living Setting: Patient's home, Lives with (comment) (spouse, daughter) Type of Home at Discharge: House Discharge Home Layout: Two level, 1/2 bath on main level Alternate Level  Stairs-Rails: Right Alternate Level Stairs-Number of Steps: 12-13 Discharge Home Access: Stairs to enter Entrance Stairs-Rails: None Entrance Stairs-Number of Steps: 2-3 after a 5-6 foot inclined walkway  Discharge Bathroom Shower/Tub: Tub/shower unit Discharge Bathroom Toilet: Standard Discharge Bathroom Accessibility: Yes How Accessible: Accessible via walker Does the patient have any problems obtaining your medications?: No  Social/Family/Support Systems Patient Roles: Spouse, Parent Contact Information: Spouse: Domingo CockingMiriam Revere (cell) [phone] Anticipated Caregiver: Spouse, daughter, and daughter's partner  Anticipated Caregiver's Contact Information: see above  Ability/Limitations of Caregiver: spouse works but all together they can provide 24/7 Caregiver Availability: 24/7 Discharge Plan Discussed with Primary Caregiver: Yes Is Caregiver In Agreement with Plan?: Yes Does Caregiver/Family have Issues with Lodging/Transportation while Pt is in Rehab?: No  Goals/Additional Needs Patient/Family Goal for Rehab: PT/OT Min A; SLP Mod I Expected length of stay: 21-25 days  Cultural Considerations: None Dietary Needs: Heart Healthy & Carb. Mod. Equipment Needs: TBD Special Service Needs: None Additional Information: None Pt/Family Agrees to Admission and willing to participate: Yes Program Orientation Provided & Reviewed with Pt/Caregiver Including Roles  & Responsibilities: Yes Additional Information Needs: None Information Needs to be Provided By: N/A  Barriers to Discharge: Home environment access/layout (12-13 steps to second floor bed and bath; 1/2 bath on main )  Decrease burden of Care through IP rehab admission: No  Possible need for SNF placement upon discharge: No  Patient Condition: This patient's condition remains as documented in the consult dated 02/10/17, in which the Rehabilitation Physician determined and documented that the patient's condition is appropriate  for intensive rehabilitative care in an inpatient rehabilitation facility. Will admit to inpatient rehab today.  Preadmission Screen Completed By:  Fae PippinMelissa Akshat Minehart, 02/11/2017 3:52 PM ______________________________________________________________________   Discussed status with Dr. Riley KillSwartz on 02/11/17 at 1600 and received telephone approval for admission today.  Admission Coordinator:  Fae PippinMelissa Armondo Cech, time 1600/Date 02/11/17       Cosigned by: Faith RogueSwartz, Zachary  T, MD at 02/11/2017 5:02 PM  Revision History

## 2017-02-14 ENCOUNTER — Inpatient Hospital Stay (HOSPITAL_COMMUNITY): Payer: 59 | Admitting: Speech Pathology

## 2017-02-14 ENCOUNTER — Encounter (HOSPITAL_COMMUNITY): Payer: Self-pay

## 2017-02-14 ENCOUNTER — Inpatient Hospital Stay (HOSPITAL_COMMUNITY): Payer: 59 | Admitting: Occupational Therapy

## 2017-02-14 ENCOUNTER — Inpatient Hospital Stay (HOSPITAL_COMMUNITY): Payer: 59 | Admitting: Physical Therapy

## 2017-02-14 DIAGNOSIS — I63521 Cerebral infarction due to unspecified occlusion or stenosis of right anterior cerebral artery: Secondary | ICD-10-CM

## 2017-02-14 LAB — GLUCOSE, CAPILLARY
GLUCOSE-CAPILLARY: 180 mg/dL — AB (ref 65–99)
GLUCOSE-CAPILLARY: 196 mg/dL — AB (ref 65–99)
GLUCOSE-CAPILLARY: 114 mg/dL — AB (ref 65–99)
GLUCOSE-CAPILLARY: 188 mg/dL — AB (ref 65–99)
GLUCOSE-CAPILLARY: 130 mg/dL — AB (ref 65–99)
GLUCOSE-CAPILLARY: 222 mg/dL — AB (ref 65–99)
Glucose-Capillary: 140 mg/dL — ABNORMAL HIGH (ref 65–99)
Glucose-Capillary: 103 mg/dL — ABNORMAL HIGH (ref 65–99)
Glucose-Capillary: 154 mg/dL — ABNORMAL HIGH (ref 65–99)
Glucose-Capillary: 166 mg/dL — ABNORMAL HIGH (ref 65–99)
Glucose-Capillary: 147 mg/dL — ABNORMAL HIGH (ref 65–99)
Glucose-Capillary: 107 mg/dL — ABNORMAL HIGH (ref 65–99)
Glucose-Capillary: 176 mg/dL — ABNORMAL HIGH (ref 65–99)
Glucose-Capillary: 163 mg/dL — ABNORMAL HIGH (ref 65–99)

## 2017-02-14 LAB — URINALYSIS, ROUTINE W REFLEX MICROSCOPIC
Bilirubin Urine: NEGATIVE
Glucose, UA: 50 mg/dL — AB
Ketones, ur: NEGATIVE mg/dL
NITRITE: NEGATIVE
PH: 5 (ref 5.0–8.0)
Protein, ur: >=300 mg/dL — AB
Specific Gravity, Urine: 1.023 (ref 1.005–1.030)
Squamous Epithelial / LPF: NONE SEEN

## 2017-02-14 MED ORDER — ACETAMINOPHEN 325 MG PO TABS
650.0000 mg | ORAL_TABLET | ORAL | Status: DC | PRN
  Administered 2017-02-14 – 2017-02-28 (×4): 650 mg via ORAL
  Filled 2017-02-14 (×4): qty 2

## 2017-02-14 MED ORDER — AMLODIPINE BESYLATE 10 MG PO TABS
10.0000 mg | ORAL_TABLET | Freq: Every day | ORAL | Status: DC
  Administered 2017-02-14 – 2017-03-11 (×26): 10 mg via ORAL
  Filled 2017-02-14 (×27): qty 1

## 2017-02-14 MED ORDER — TIZANIDINE HCL 2 MG PO TABS
2.0000 mg | ORAL_TABLET | Freq: Every day | ORAL | Status: DC
  Administered 2017-02-14 – 2017-02-16 (×3): 2 mg via ORAL
  Filled 2017-02-14 (×3): qty 1

## 2017-02-14 MED ORDER — TRAZODONE HCL 50 MG PO TABS
25.0000 mg | ORAL_TABLET | Freq: Every evening | ORAL | Status: DC | PRN
  Administered 2017-02-14: 25 mg via ORAL
  Filled 2017-02-14: qty 1

## 2017-02-14 MED ORDER — PROCHLORPERAZINE MALEATE 5 MG PO TABS: 5.0000 mg | ORAL_TABLET | Freq: Four times a day (QID) | ORAL | Status: DC | PRN

## 2017-02-14 MED ORDER — PROCHLORPERAZINE EDISYLATE 5 MG/ML IJ SOLN: 5.0000 mg | Freq: Four times a day (QID) | INTRAMUSCULAR | Status: DC | PRN

## 2017-02-14 MED ORDER — PROCHLORPERAZINE 25 MG RE SUPP: 12.5000 mg | Freq: Four times a day (QID) | RECTAL | Status: DC | PRN

## 2017-02-14 MED ORDER — DIPHENHYDRAMINE HCL 12.5 MG/5ML PO ELIX: 12.5000 mg | ORAL_SOLUTION | Freq: Four times a day (QID) | ORAL | Status: DC | PRN

## 2017-02-14 MED ORDER — TAMSULOSIN HCL 0.4 MG PO CAPS
0.4000 mg | ORAL_CAPSULE | Freq: Every day | ORAL | Status: DC
  Administered 2017-02-14 – 2017-02-17 (×4): 0.4 mg via ORAL
  Filled 2017-02-14 (×5): qty 1

## 2017-02-14 NOTE — Progress Notes (Signed)
Speech Language Pathology Daily Session Note  Patient Details  Name: Frank Moss MRN: [id number] Date of Birth: June 29, 1971  Today's Date: 02/14/2017 SLP Individual Time: 0815-0855 SLP Individual Time Calculation (min): 40 min  Short Term Goals: Week 1: SLP Short Term Goal 1 (Week 1): Pt will initiate and complete tasks in a timely manner during basic functional tasks with mod assist verbal cues.   SLP Short Term Goal 2 (Week 1): Pt will complete basic, familiar tasks with mod assist verbal cues for functional problem solving.   SLP Short Term Goal 3 (Week 1): Pt will utilize external aids to recall basic, daily information with mod assist verbal cues.   SLP Short Term Goal 4 (Week 1): Pt will sustain his attention to basic, familiar tasks for 5-7 minutes with mod assist verbal cues for redirection.    Skilled Therapeutic Interventions: Skilled treatment session focused on cognitive goals. SLP facilitated session by providing Max A verbal cues for initiation with breakfast meal of regular textures with thin liquids but was able to sustain attention to self-feeding for ~15 minutes with Min A verbal cues for redirection. Patient also participated in a basic money management task with Max A multimodal cues for sustained attention and problem solving due to fatigue. Will re-attempt task at next session. Patient left upright in bed with all needs within reach. Continue with current plan of care.      Function:   Cognition Comprehension Comprehension assist level: Understands basic 90% of the time/cues < 10% of the time  Expression   Expression assist level: Expresses basic 50 - 74% of the time/requires cueing 25 - 49% of the time. Needs to repeat parts of sentences.  Social Interaction Social Interaction assist level: Interacts appropriately 50 - 74% of the time - May be physically or verbally inappropriate.  Problem Solving Problem solving assist level: Solves basic 25 - 49% of the time - needs  direction more than half the time to initiate, plan or complete simple activities  Memory Memory assist level: Recognizes or recalls 75 - 89% of the time/requires cueing 10 - 24% of the time    Pain No/Denies Pain   Therapy/Group: Individual Therapy  Frank Moss 02/14/2017, 3:14 PM

## 2017-02-14 NOTE — IPOC Note (Addendum)
Overall Plan of Care [facility](IPOC) Patient Details Name: Frank Moss MRN: [id number] DOB: July 26, 1970  Admitting Diagnosis: CVA  Hospital Problems: Active Problems:   Acute thalamic infarction (HCC)   Diabetes mellitus (HCC)   Benign essential HTN   Urinary retention   Stage 3 chronic kidney disease   Hyponatremia   Hypoalbuminemia due to protein-calorie malnutrition (HCC)   Acute blood loss anemia   Diabetes mellitus type 2 in nonobese [facility](HCC)   Hypertensive crisis     Functional Problem List: Nursing Behavior, Bladder, Bowel, Endurance, Perception, Safety, Sensory  PT Balance, Behavior, Endurance, Motor, Nutrition, Pain, Perception, Safety, Sensory, Skin Integrity  OT Balance, Motor, Pain, Perception, Safety, Endurance, Vision, Sensory  SLP Cognition  TR         Basic ADL's: OT Grooming, Bathing, Dressing, Toileting, Eating     Advanced  ADL's: OT       Transfers: PT Bed Mobility, Bed to Chair, Car, State Street CorporationFurniture, Civil Service fast streamerloor  OT Toilet, Research scientist (life sciences)Tub/Shower     Locomotion: PT Ambulation, Psychologist, prison and probation servicesWheelchair Mobility, Stairs     Additional Impairments: OT Fuctional Use of Upper Extremity  SLP Social Cognition   Problem Solving, Memory, Attention, Awareness  TR      Anticipated Outcomes Item Anticipated Outcome  Self Feeding modified independent  Swallowing      Basic self-care  min assist  Toileting  min assist   Bathroom Transfers min assist  Bowel/Bladder  minimum assistance and continent of bowel and bladder  Transfers  Min assist with LRAD   Locomotion  [facility].   Communication     Cognition  min assist   Pain  manageable pain 2 of 0-10  Safety/Judgment  To call for assistance before getting out of bed, to be free of fall and injury while in the rehab.   Therapy Plan: PT Intensity: Minimum of 1-2 x/day ,45 to 90 minutes PT Frequency: 5 out of 7 days PT Duration Estimated Length of Stay: 3.5-4 weeks  OT Intensity: Minimum of 1-2 x/day, 45 to 90  minutes OT Frequency: 5 out of 7 days OT Duration/Estimated Length of Stay: 21-23 days SLP Intensity: Minumum of 1-2 x/day, 30 to 90 minutes SLP Frequency: 3 to 5 out of 7 days SLP Duration/Estimated Length of Stay: 21 days     Team Interventions: Nursing Interventions Patient/Family Education, Bladder Management, Bowel Management, Pain Management, Skin Care/Wound Management, Psychosocial Support, Medication Management, Dysphagia/Aspiration Precaution Training  PT interventions Ambulation/gait training, Warden/rangerBalance/vestibular training, Cognitive remediation/compensation, Community reintegration, Discharge planning, Disease management/prevention, DME/adaptive equipment instruction, Functional electrical stimulation, Functional mobility training, Neuromuscular re-education, Pain management, Splinting/orthotics, Psychosocial support, Skin care/wound management, Patient/family education, Stair training, Therapeutic Activities, Therapeutic Exercise, UE/LE Strength taining/ROM, UE/LE Coordination activities, Visual/perceptual remediation/compensation, Wheelchair propulsion/positioning  OT Interventions Warden/rangerBalance/vestibular training, Discharge planning, Functional electrical stimulation, Pain management, Self Care/advanced ADL retraining, Therapeutic Activities, Therapeutic Exercise, Patient/family education, Functional mobility training, Cognitive remediation/compensation, Community reintegration, Fish farm managerDME/adaptive equipment instruction, Neuromuscular re-education, UE/LE Strength taining/ROM, Wheelchair propulsion/positioning  SLP Interventions Cognitive remediation/compensation, Financial traderCueing hierarchy, Environmental controls, Functional tasks, Internal/external aids, Patient/family education  TR Interventions    SW/CM Interventions Discharge Planning, Psychosocial Support, Patient/Family Education   Barriers to Discharge MD  Medical stability and Home enviroment access/loayout  Nursing Medical stability Incontinent of  bowel and bladder, cognition impairement  PT Inaccessible home environment, Decreased caregiver support, Home environment access/layout    OT Decreased caregiver support, Home environment access/layout, Lack of/limited family support    SLP Decreased caregiver support, Lack of/limited family support pt's wife  works full time and they have a young son   SW       Team Discharge Planning: Destination: PT-Skilled Nursing Facility (SNF) ,OT- Home (unsure as pt states wife works) , Engineer, manufacturingLP-Skilled Nursing Facility (SNF) Projected Follow-up: PT-Skilled nursing facility, OT-  24 hour supervision/assistance, SLP-Skilled Nursing facility Projected Equipment Needs: PT-Wheelchair cushion (measurements), Wheelchair (measurements), Rolling walker with 5" wheels, To be determined, OT- 3 in 1 bedside comode, SLP-None recommended by SLP Equipment Details: PT- , OT-  Patient/family involved in discharge planning: PT- Patient,  OT-Patient, SLP-Patient  MD ELOS: 19-22d Medical Rehab Prognosis:  Good Assessment:  46 year old right handed male with history of T2DM with retinopathy, HTN, hyperlipidemia and medication noncompliance who had a right ACA stroke on 01/20/2017. [facility] and started on aspirin and Plavix for his stroke prophylaxis. Hemoglobin A1c was 11.2, LDL- 65, and triglycerides- 298. During that hospitalization, he had evidence of depression as well as urinary retention and a Foley catheter was placed. He was discharged to a skilled nursing facility for rehab. [facility] , patient had physical,occupational and speech therapy. On 02/08/2017, he had worsening of swallow function and was not able to swallow food or medications--no abnormal seizure type activity reported. [facility] and [facility] after MRI demonstrated small acute infarct in the right anterior thalamus, a subacute right ACA infarct, chronic microvascular ischemic changes advanced for age and  chronic right occipital infarct. Cardiology evaluated the patient, recommended TEE. This was performed 7/27 and revealed normal LVF, no shunt, no thrombus or vegetation and trivial pericardial effusion. Stroke felt to be embolic due to severe A2 and P2 segment atheromatous stenosis, advanced SVD and poorly controlled diabetes. Cardiology recommending 30 day heart monitor over loop recorder. Therapy evaluation revealed need for Huntley DecSara Plus to maximum assist level needed for stand pivot transfers, bed mobility was mod assist X2 and occupational therapy and demonstrated max assist X 2 for toileting   See Team Conference Notes for weekly updates to the plan of care

## 2017-02-14 NOTE — Progress Notes (Addendum)
Physical Therapy Session Note  Patient Details  Name: Frank Moss MRN: [id number] Date of Birth: 13-May-1971  Today's Date: 02/14/2017 PT Individual Time: 0900-1000 and 1530-1600 PT Individual Time Calculation (min): 60 min and 30 min (90 min total)  Short Term Goals: Week 1:  PT Short Term Goal 1 (Week 1): Pt will perform bed mobility with max assist of 1 consistently  PT Short Term Goal 2 (Week 1): Pt Will perform squat pivot transfer with total assist of 1 consistently  PT Short Term Goal 3 (Week 1): Pt will initate WC mobility  PT Short Term Goal 4 (Week 1): Pt will perform sit<>stand with max assist of one consistently   Skilled Therapeutic Interventions/Progress Updates:  Session 1:    no c/o pain.  Session focus on motor planning, initiation, visual scanning, and positioning.    Pt rolls L with supervision/R with max assist to don clean brief.  Dressing from bed level (LB-max assist) and w/c (UB-mod assist).  Squat/pivot from EOB>w/c on pt's R with max assist, multimodal cues for forward weight shift, and completion of transfer.  PT adjusted pt's leg rests and provided positioning aid for neutral LLE rotation in w/c.  Pt requires +2 assist to scoot back in w/c on 2 attempts due to apraxia.  Dynavision x2 trials mode A focus on visual scanning, initiation, and forward weight shift.  Pt scores 11 hits on first trial and 10 hits on second trial with verbal cues to continue task and to maintain hips positioned safely in chair (pt with tendency to scoot hips forward).  Pt requires increased time to initiate/complete all tasks 2/2 motor planning deficits.  Pt returned to room at end of session and positioned in tilt in space w/c with QRB in place, call bell in reach, and needs met.   Session 2:  Pt on toilet with nursing staff on arrival, apparently with great difficulty coming to stand in stedy.  Pt unable to forward weight shift or initiate stand and ultimately required +2 total assist for  forward weight shift and to come to standing, with immediate reports of pain in LLE (?flexor withdraw vs tone).  Transfer back to edge of bed with stedy.  Pt able to come to standing x1 in stedy from EOB with increased time and max assist.  Sit>supine with mod assist for BLEs 2/2 pain in LLE.  Attempted to provide PROM to LLE but pt with increased flexor tone/withdraw in LLE with knee/hip extension.  PT noted large trigger point in pt's adductor as well.  Positioned to comfort with call bell in reach and needs met.   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   Therapy/Group: Individual Therapy  Nico Rogness E Penven-Crew 02/14/2017, 10:00 AM

## 2017-02-14 NOTE — Progress Notes (Signed)
[place] PHYSICAL MEDICINE & REHABILITATION     PROGRESS NOTE  Subjective/Complaints:   No issues overnite, very tired this am awakes to voice  ROS: Denies CP, SOB, nausea, vomiting, diarrhea.  Objective: Vital Signs: Blood pressure (!) 186/91, pulse 78, temperature 98.6 F (37 C), temperature source Oral, resp. rate 18, height 6\' 2"  (1.88 m), weight 102.7 kg (226 lb 6 oz), SpO2 100 %. No results found.  Recent Labs  02/12/17 0521  WBC 5.9  HGB 12.4*  HCT 36.2*  PLT 279    Recent Labs  02/12/17 0521  NA 134*  K 4.0  CL 100*  GLUCOSE 90  BUN 13  CREATININE 1.30*  CALCIUM 8.5*   CBG (last 3)   Recent Labs  02/11/17 1818  GLUCAP 82    Wt Readings from Last 3 Encounters:  02/13/17 102.7 kg (226 lb 6 oz)  02/08/17 103.7 kg (228 lb 11.2 oz)  02/08/17 124.3 kg (274 lb)    Physical Exam:  BP (!) 186/91 (BP Location: Right Arm)   Pulse 78   Temp 98.6 F (37 C) (Oral)   Resp 18   Ht 6\' 2"  (1.88 m)   Wt 102.7 kg (226 lb 6 oz)   SpO2 100%   BMI 29.06 kg/m  Constitutional: He appears well-developedand well-nourished.  HENT: Normocephalicand atraumatic.  Eyes: EOMI. No discharge. Cardiovascular: RRR. No JVD. Respiratory: Effort normal breath sounds normal.  GI: Soft. Bowel sounds are normal.   Neurological: He is alertand oriented.  Left facial weakness with left gaze preference and right neglect, improving.  Delayed processing with slow speech. His attention is poor and he lacks awareness/insight into deficits. Spastic left hemiparesis  Motor: Left lower extremity: 2+/5 proximal to distal Skin: Skin is warmand dry.  Psychiatric: His affect is blunt. His speech is delayed. He is slowed. He expresses inappropriate judgment. He is inattentive.    Assessment/Plan: 1. 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 Physiatrist is providing close team supervision and 24 hour management of active medical problems listed below. Physiatrist and rehab team continue to assess barriers to discharge/monitor patient progress toward functional and medical goals.  Function:  Bathing Bathing position   Position: Sitting EOB  Bathing parts Body parts bathed by patient: Left arm, Chest, Abdomen, Front perineal area, Right upper leg, Left upper leg Body parts bathed by helper: Right lower leg, Left lower leg, Buttocks, Right arm, Back  Bathing assist Assist Level: 2 helpers      Upper Body Dressing/Undressing Upper body dressing   What is the patient wearing?: Hospital gown                Upper body assist        Lower Body Dressing/Undressing Lower body dressing   What is the patient wearing?: Non-skid slipper socks           Non-skid slipper socks- Performed by helper: Don/doff right sock, Don/doff left sock                  Lower body assist Assist for lower body dressing: 2 Helpers      Toileting Toileting          Toileting assist     Transfers Chair/bed transfer   Chair/bed transfer method: Squat pivot Chair/bed transfer assist level: 2 helpers       Librarian, academicLocomotion Ambulation Ambulation  activity did not occur: Safety/medical Investment banker, operationalconcerns         Wheelchair Wheelchair activity did not occur: Safety/medical concerns Type: Manual      Cognition Comprehension Comprehension assist level: Understands basic 90% of the time/cues < 10% of the time  Expression Expression assist level: Expresses basic 75 - 89% of the time/requires cueing 10 - 24% of the time. Needs helper to occlude trach/needs to repeat words.  Social Interaction Social Interaction assist level: Interacts appropriately 50 - 74% of the time - May be physically or verbally inappropriate.  Problem Solving Problem solving assist level: Solves basic 25 - 49% of the time - needs direction more than half the  time to initiate, plan or complete simple activities  Memory Memory assist level: Recognizes or recalls 75 - 89% of the time/requires cueing 10 - 24% of the time    Medical Problem List and Plan: 1.  Left hemiparesis, hemisensory loss,  and cognitive deficits secondary to right ACA infarct, small right thalamic infarct on 7/24  Cont CIR PT, OT SLP 2.  DVT Prophylaxis/Anticoagulation: Pharmaceutical: Lovenox 3. H/o lumbago/Pain Management: Denies pain at this moment. Tylenol prn 4. Mood: LCSW to follow for evaluation and support.  5. Neuropsych: This patient is not capable of making decisions on his  own behalf. 6. Skin/Wound Care: routine pressure relief measures 7. Fluids/Electrolytes/Nutrition: Monitor I/Os--offer supplements if intake poor.   8.T2DM: monitor BS ac/hs. Continue lantus daily with meal coverage. Will use SSI for elevated BS.    CBG (last 3) not being monitored will check ac TID  Recent Labs  02/11/17 1818  GLUCAP 82    9. HTN:  Monitor BP bid  Norvasc 5mg  daily, increased to 7.5 on 7/28, will increase to 10mg  7/30   Vitals:   02/13/17 1522 02/14/17 0452  BP: (!) 185/86 (!) 186/91  Pulse: 85 78  Resp: 20 18  Temp: 99 F (37.2 C) 98.6 F (37 C)   10 H/o urinary retention: currently incontinent. Toilet patient every 4 hours and cath if retention noted.   11. Hyperlipidemia: On atorvastatin.  12. Question CKD: baseline SCr around 1.6 per records review. Continue to monitor.  Offer fluids between meals for adequate hydration.   Cr. 1.30 on 7/28  Cont to monitor 13. Hyponatremia  Sodium 134 on 7/28  Continue to monitor 14. Hypoalbuminemia  Supplement initiated 7/28 15. Acute blood loss anemia  Hemoglobin 12.4 on 7/28  Continue to monitor 16.  Spasticity- Left side , L hanstring most affected start night time zanaflex LOS (Days) 3 A FACE TO FACE EVALUATION WAS PERFORMED  Oni Dietzman E 02/14/2017 7:00 AM

## 2017-02-14 NOTE — Progress Notes (Signed)
Occupational Therapy Session Note  Patient Details  Name: Frank Moss MRN: [id number] Date of Birth: 1970-12-12  Today's Date: 02/14/2017 OT Individual Time: 1302-1400 OT Individual Time Calculation (min): 58 min    Short Term Goals: Week 1:  OT Short Term Goal 1 (Week 1): Pt will maintain static sitting balance EOB with supervision for 3 mins in preparation for transfers or selfcare.  OT Short Term Goal 2 (Week 1): Pt will maintain dynamic sitting balance unsupported EOB or EOC with min assist during bathing and dressing tasks.  OT Short Term Goal 3 (Week 1): Pt will complete UB dressing with min assist to donn pullover shirt.  OT Short Term Goal 4 (Week 1): Pt will donn pull up pants with mod assist sit to stand.  OT Short Term Goal 5 (Week 1): Pt will use the LUE as an active assist for bathing tasks with no more than mod assist.   Skilled Therapeutic Interventions/Progress Updates:    OT treatment session focused on attention, initiation, motor planning, and coordination within bathing/dressing tasks. Bed level LB bathing/dressing completed with overall Max/total A. Guided assist to integrate L UE into bathing/dressing tasks with instructional cues to motor plan movements from L side. Pt initiated washing face, chest, and R leg with min cues, but needed multimodal cues to complete L side. Min A to roll to L, then was able to assist with washing buttocks. Total A to roll to the R to change brief and pull pants over hips. Sup<>sit with Max A and multimodal cues to motor plan. Squat-pivot transfer to R with Max A and hand-over hand A for hand placement and initiation. Difficulty maintaining anterior weight shift with transfer. Pt left seated in TIS wc with needs met and safety belt on.   Therapy Documentation Precautions:  Precautions Precautions: Fall Precaution Comments: pusher to the left, apraxia, decreased initiation, left hemiparesis Restrictions Weight Bearing Restrictions: No     See Function Navigator for Current Functional Status.   Therapy/Group: Individual Therapy  Valma Cava 02/14/2017, 3:45 PM

## 2017-02-14 NOTE — Plan of Care (Signed)
Problem: RH BLADDER ELIMINATION Goal: RH STG MANAGE BLADDER WITH ASSISTANCE STG Manage Bladder incontinence With minimal Assistance   Outcome: Not Progressing Moderate with 2 assistance, patient does not follow commands  Problem: RH PAIN MANAGEMENT Goal: RH STG PAIN MANAGED AT OR BELOW PT'S PAIN GOAL Patient's pain level managed at a tolerable level.  Outcome: Progressing Denies pain

## 2017-02-14 NOTE — Progress Notes (Signed)
02/14/17  1115  Left message on voicemail for Dr Wynn BankerKirsteins [phone],stating that patient wife is concerned that patient will develope a UTI if we continue to In and out Cath. Wife requesting urology to see patient. Waiting for orders.

## 2017-02-15 ENCOUNTER — Inpatient Hospital Stay (HOSPITAL_COMMUNITY): Payer: 59 | Admitting: Occupational Therapy

## 2017-02-15 ENCOUNTER — Inpatient Hospital Stay (HOSPITAL_COMMUNITY): Payer: 59 | Admitting: Speech Pathology

## 2017-02-15 ENCOUNTER — Inpatient Hospital Stay (HOSPITAL_COMMUNITY): Payer: 59 | Admitting: Physical Therapy

## 2017-02-15 ENCOUNTER — Encounter (HOSPITAL_COMMUNITY): Payer: Self-pay

## 2017-02-15 LAB — GLUCOSE, CAPILLARY
GLUCOSE-CAPILLARY: 178 mg/dL — AB (ref 65–99)
GLUCOSE-CAPILLARY: 199 mg/dL — AB (ref 65–99)
GLUCOSE-CAPILLARY: 224 mg/dL — AB (ref 65–99)
Glucose-Capillary: 188 mg/dL — ABNORMAL HIGH (ref 65–99)

## 2017-02-15 MED ORDER — TIZANIDINE HCL 2 MG PO TABS: 1.0000 mg | ORAL_TABLET | Freq: Every day | ORAL | Status: DC

## 2017-02-15 MED ORDER — TIZANIDINE HCL 2 MG PO TABS
1.0000 mg | ORAL_TABLET | Freq: Two times a day (BID) | ORAL | Status: DC
Start: 2017-02-16 — End: 2017-02-17
  Administered 2017-02-16 – 2017-02-17 (×3): 1 mg via ORAL
  Filled 2017-02-15 (×3): qty 1

## 2017-02-15 NOTE — Care Management Note (Signed)
Inpatient Rehabilitation Center Individual Statement of Services  Patient Name:  Frank Moss  Date:  02/15/2017  Welcome to the Inpatient Rehabilitation Center.  Our goal is to provide you with an individualized program based on your diagnosis and situation, designed to meet your specific needs.  With this comprehensive rehabilitation program, you will be expected to participate in at least 3 hours of rehabilitation therapies Monday-Friday, with modified therapy programming on the weekends.  Your rehabilitation program will include the following services:  Physical Therapy (PT), Occupational Therapy (OT), Speech Therapy (ST), 24 hour per day rehabilitation nursing, Therapeutic Recreaction (TR), Neuropsychology, Case Management (Social Worker), Rehabilitation Medicine, Nutrition Services and Pharmacy Services  Weekly team conferences will be held on Wednesdays to discuss your progress.  Your Social Worker will talk with you frequently to get your input and to update you on team discussions.  Team conferences with you and your family in attendance may also be held.  Expected length of stay: 3-4 weeks  Overall anticipated outcome: minimal assistance  Depending on your progress and recovery, your program may change. Your Social Worker will coordinate services and will keep you informed of any changes. Your Social Worker's name and contact numbers are listed  below.  The following services may also be recommended but are not provided by the Inpatient Rehabilitation Center:   Driving Evaluations  Home Health Rehabiltiation Services  Outpatient Rehabilitation Services  Vocational Rehabilitation   Arrangements will be made to provide these services after discharge if needed.  Arrangements include referral to agencies that provide these services.  Your insurance has been verified to be:  [place] Your primary doctor is:  None (will establish primary MD prior to rehab discharge)  Pertinent  information will be shared with your doctor and your insurance company.  Social Worker:  Hall SummitLucy Loribeth [place], [place] [phone] or ([phone]) [phone]   Information discussed with and copy given to patient by: Amada JupiterHOYLE, Kevante Lunt, 02/15/2017, 4:02 PM

## 2017-02-15 NOTE — Progress Notes (Signed)
Physical Therapy Session Note  Patient Details  Name: Frank Moss MRN: [id number] Date of Birth: [birth year]  Today's Date: 02/15/2017 PT Individual Time: 0930-1030 PT Individual Time Calculation (min): 60 min   Short Term Goals: Week 1:  PT Short Term Goal 1 (Week 1): Pt will perform bed mobility with max assist of 1 consistently  PT Short Term Goal 2 (Week 1): Pt Will perform squat pivot transfer with total assist of 1 consistently  PT Short Term Goal 3 (Week 1): Pt will initate WC mobility  PT Short Term Goal 4 (Week 1): Pt will perform sit<>stand with max assist of one consistently   Skilled Therapeutic Interventions/Progress Updates:    no c/o pain at rest but pt with significant pain with LLE movement (both passive and active) throughout session 2/2 tone.  Session focus on NMR, postural control, and transfers.   Pt transfers squat/pivot to R and L with +2 assist.  Attempted slide board transfer but pt continues to require +2 assist today, possibly due to LLE pain.  PT performs PROM stretching to LLE for tone management for hamstrings and hip flexors/adductors.  Attempted prone hip flexor stretch but pt unable to tolerate position.  Pt requires +2 for sit<>supine.  Seated sitting balance during reaching task focus on return to midline, visual scanning, use of BUE, and trunk rotation/elongation/shortening.  Pt demos dynamic sitting balance with overall supervision, occasional max assist for LOB posteriorly.  Sit<>stand x2 from edge of mat with +2 mod assist on best attempt, mirror for visual feedback on second attempt.  Multimodal cues for upright posture, R weight shift, and LLE activation.  Pt handoff to SLP at end of session.    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   Therapy/Group: Individual Therapy  Luke Rigsbee E Penven-Crew 02/15/2017, 11:44 AM

## 2017-02-15 NOTE — Progress Notes (Signed)
Speech Language Pathology Daily Session Note  Patient Details  Name: Frank Moss MRN: [id number] Date of Birth: 1971-03-16  Today's Date: 02/15/2017 SLP Individual Time: 1030-1100 SLP Individual Time Calculation (min): 30 min  Short Term Goals: Week 1: SLP Short Term Goal 1 (Week 1): Pt will initiate and complete tasks in a timely manner during basic functional tasks with mod assist verbal cues.   SLP Short Term Goal 2 (Week 1): Pt will complete basic, familiar tasks with mod assist verbal cues for functional problem solving.   SLP Short Term Goal 3 (Week 1): Pt will utilize external aids to recall basic, daily information with mod assist verbal cues.   SLP Short Term Goal 4 (Week 1): Pt will sustain his attention to basic, familiar tasks for 5-7 minutes with mod assist verbal cues for redirection.    Skilled Therapeutic Interventions: Skilled treatment session focused on cognitive goals. SLP facilitated session by providing supervision verbal cues for basic problem solving during a money management task and Min-Mod A verbal cues recall and sustained attention. SLP also initiated use of a memory notebook to maximize recall of functional information in which patient required Max A to recall events from previous therapy sessions.  Patient left reclined in wheelchair with quick release belt in place and all needs within reach. Continue with current plan of care.      Function:  Cognition Comprehension Comprehension assist level: Understands basic 90% of the time/cues < 10% of the time  Expression   Expression assist level: Expresses basic 50 - 74% of the time/requires cueing 25 - 49% of the time. Needs to repeat parts of sentences.  Social Interaction Social Interaction assist level: Interacts appropriately 50 - 74% of the time - May be physically or verbally inappropriate.  Problem Solving Problem solving assist level: Solves basic 25 - 49% of the time - needs direction more than half the  time to initiate, plan or complete simple activities  Memory Memory assist level: Recognizes or recalls 75 - 89% of the time/requires cueing 10 - 24% of the time    Pain No/Denies Pain  Therapy/Group: Individual Therapy  Virgia Kelner 02/15/2017, 12:12 PM

## 2017-02-15 NOTE — Progress Notes (Signed)
Occupational Therapy Session Note  Patient Details  Name: Frank Moss MRN: [id number] Date of Birth: 07/19/1971  Today's Date: 02/15/2017 OT Individual Time: 4098-11910845-0930 OT Individual Time Calculation (min): 45 min    Short Term Goals: Week 1:  OT Short Term Goal 1 (Week 1): Pt will maintain static sitting balance EOB with supervision for 3 mins in preparation for transfers or selfcare.  OT Short Term Goal 2 (Week 1): Pt will maintain dynamic sitting balance unsupported EOB or EOC with min assist during bathing and dressing tasks.  OT Short Term Goal 3 (Week 1): Pt will complete UB dressing with min assist to donn pullover shirt.  OT Short Term Goal 4 (Week 1): Pt will donn pull up pants with mod assist sit to stand.  OT Short Term Goal 5 (Week 1): Pt will use the LUE as an active assist for bathing tasks with no more than mod assist.   Skilled Therapeutic Interventions/Progress Updates:    1:1 Pt in bed when arrived with left LE abducted with knee flexed and pain and unable to fully extend at midline.  Focus on LB bating and dressing with visual and body attention to left, bed mobility for peri hygiene and clothing management. Came to EOB with max A.  Sit to stand into STEADY and transitioned to the sink. Pt performed UB/LB bathing and dressing at sink while sitting in the STEADY.  Mod cuing for maintaining sitting balance at midline using mirror for visual feedback. Also focused on functional use of left UE as gross assist with minA due to apraxia and motor planning. Transitioned on the STEDY to the bathroom for attempt to void sitting on the toilet (with urinal) with no success.  Transitioned to the tilt in space w/c via STEADY.    2nd session 13:00-13:30 (30 min) 1:1 Pt received in the w/c.  Focus on sit to stand and standing tolerance in the SARA PLUS with emphasis on standing for stretching of his left hamstring and calf with weight shifting on and off left LE and ability to relax mm and  work through flexor tone. Pt able to tolerate enough even able to take some steps in the SARA with +3 with max tactile cues for stepping and promoting hip and knee extension.   Therapy Documentation Precautions:  Precautions Precautions: Fall Precaution Comments: pusher to the left, apraxia, decreased initiation, left hemiparesis Restrictions Weight Bearing Restrictions: No Pain: Ongoing pain in left Le with hip/ knee extension at midline - flexor withdrawal in both sessions. See Function Navigator for Current Functional Status.   Therapy/Group: Individual Therapy  [name], Dylon Correa [facility] 02/15/2017, 2:10 PM

## 2017-02-15 NOTE — Plan of Care (Signed)
Problem: RH BOWEL ELIMINATION Goal: RH STG MANAGE BOWEL WITH ASSISTANCE STG Manage Bowel with minimal Assistance to [facility] or bathroom.   Outcome: Not Progressing Incontinent, LBM 7/27  Problem: RH BLADDER ELIMINATION Goal: RH STG MANAGE BLADDER WITH ASSISTANCE STG Manage Bladder incontinence With minimal Assistance   Outcome: Not Progressing Total assist -changed brief

## 2017-02-15 NOTE — Progress Notes (Addendum)
Physical Therapy Session Note  Patient Details  Name: Frank Moss MRN: [id number] Date of Birth: [birth year]  Today's Date: 02/15/2017 PT Individual Time: [id number] PT Individual Time Calculation (min): 27 min   Short Term Goals: Week 1:  PT Short Term Goal 1 (Week 1): Pt will perform bed mobility with max assist of 1 consistently  PT Short Term Goal 2 (Week 1): Pt Will perform squat pivot transfer with total assist of 1 consistently  PT Short Term Goal 3 (Week 1): Pt will initate WC mobility  PT Short Term Goal 4 (Week 1): Pt will perform sit<>stand with max assist of one consistently   Skilled Therapeutic Interventions/Progress Updates:  Pt received in TIS w/c & agreeable to tx. Transported pt to [employer] gym where pt engaged in dynavision with about equal reaction time to L & R quadrants. Pt with L lateral lean and reports he is not aware of this. Transitioned to balance and midline orientation task with pt reaching for objects outside of BOS while sitting in w/c. Utilized Ship brokermirror for visual feedback for midline posture but pt continues to require mod cuing to return to midline. Pt also required max multimodal cuing for anterior weight shift and to scoot back in w/c seat. Pt also easily distracted in gym environment. At end of session pt left sitting in w/c with QRB donned & all needs within reach.   Therapy Documentation Precautions:  Precautions Precautions: Fall Precaution Comments: pusher to the left, apraxia, decreased initiation, left hemiparesis Restrictions Weight Bearing Restrictions: No  Pain: Pt c/o LLE pain following ambulation during previous tx session.   See Function Navigator for Current Functional Status.   Therapy/Group: Individual Therapy  Sandi MariscalVictoria M Marlee Armenteros 02/15/2017, 3:00 PM

## 2017-02-15 NOTE — Progress Notes (Signed)
Patient with flexor tone and inability to WB on LLE. Blood pressures stable and no sedation seen with dose of Zanaflex last pm. Will start am doses to help with tone during the day.

## 2017-02-15 NOTE — Plan of Care (Signed)
Problem: RH BOWEL ELIMINATION Goal: RH STG MANAGE BOWEL WITH ASSISTANCE STG Manage Bowel with minimal Assistance to [facility] or bathroom.   No bowel this shift   Problem: RH BLADDER ELIMINATION Goal: RH STG MANAGE BLADDER WITH ASSISTANCE STG Manage Bladder incontinence With minimal Assistance   Outcome: Not Progressing Incontinent of bladder, tonight, scanned with 80 ml in bladder  Problem: RH PAIN MANAGEMENT Goal: RH STG PAIN MANAGED AT OR BELOW PT'S PAIN GOAL Patient's pain level managed at a tolerable level.  Outcome: Progressing Medicated once for left leg pain with mod. relief  Problem: RH KNOWLEDGE DEFICIT Goal: RH STG INCREASE KNOWLEDGE OF DIABETES Patient and family will demonstrate increased knowledge of diabetes and associated medications, s/s of hypo/hyperglycemia, dietary restrictions, and follow-up with the MD post discharge with resources/reminders.   Outcome: Not Progressing Cognition impaired

## 2017-02-15 NOTE — Progress Notes (Signed)
[place] PHYSICAL MEDICINE & REHABILITATION     PROGRESS NOTE  Subjective/Complaints:   No issues overnite, wife was reportedly concerned about ICP performed yesterday Pt without abd pain  ROS: Denies CP, SOB, nausea, vomiting, diarrhea.  Objective: Vital Signs: Blood pressure (!) 179/82, pulse 84, temperature 98.2 F (36.8 C), temperature source Oral, resp. rate 18, height 6\' 2"  (1.88 m), weight 102.7 kg (226 lb 6 oz), SpO2 96 %. No results found. No results for input(s): WBC, HGB, HCT, PLT in the last 72 hours. No results for input(s): NA, K, CL, GLUCOSE, BUN, CREATININE, CALCIUM in the last 72 hours.  Invalid input(s): CO CBG (last 3)   Recent Labs  02/14/17 1642 02/14/17 2029 02/15/17 0603  GLUCAP 163* 222* 178*    Wt Readings from Last 3 Encounters:  02/13/17 102.7 kg (226 lb 6 oz)  02/08/17 103.7 kg (228 lb 11.2 oz)  02/08/17 124.3 kg (274 lb)    Physical Exam:  BP (!) 179/82 (BP Location: Right Arm)   Pulse 84   Temp 98.2 F (36.8 C) (Oral)   Resp 18   Ht 6\' 2"  (1.88 m)   Wt 102.7 kg (226 lb 6 oz)   SpO2 96%   BMI 29.06 kg/m  Constitutional: He appears well-developedand well-nourished.  HENT: Normocephalicand atraumatic.  Eyes: EOMI. No discharge. Cardiovascular: RRR. No JVD. Respiratory: Effort normal breath sounds normal.  GI: Soft. Bowel sounds are normal.   Neurological: He is alertand oriented.  Left facial weakness with left gaze preference and right neglect, improving.  Delayed processing with slow speech. His attention is poor and he lacks awareness/insight into deficits. Spastic left hemiparesis  Motor: Left lower extremity: 2+/5 proximal to distal Skin: Skin is warmand dry.  Psychiatric: His affect is blunt. His speech is delayed. He is slowed. He expresses inappropriate judgment. He is inattentive.    Assessment/Plan: 1. 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, small right thalamic infarct which require 3+ hours per day  of interdisciplinary therapy in a comprehensive inpatient rehab setting. Physiatrist is providing close team supervision and 24 hour management of active medical problems listed below. Physiatrist and rehab team continue to assess barriers to discharge/monitor patient progress toward functional and medical goals.  Function:  Bathing Bathing position   Position: Bed (and sitting EOB)  Bathing parts Body parts bathed by patient: Left arm, Chest, Abdomen, Front perineal area, Right upper leg, Left upper leg Body parts bathed by helper: Right lower leg, Left lower leg, Buttocks, Right arm, Back  Bathing assist Assist Level: 2 helpers      Upper Body Dressing/Undressing Upper body dressing   What is the patient wearing?: Pull over shirt/dress     Pull over shirt/dress - Perfomed by patient: Put head through opening Pull over shirt/dress - Perfomed by helper: Pull shirt over trunk, Thread/unthread left sleeve, Thread/unthread right sleeve        Upper body assist Assist Level: Touching or steadying assistance(Pt > 75%)      Lower Body Dressing/Undressing Lower body dressing   What is the patient wearing?: Pants, Non-skid slipper socks     Pants- Performed by patient: Thread/unthread right pants leg Pants- Performed by helper: Thread/unthread left pants leg, Pull pants up/down   Non-skid slipper socks- Performed by helper: Don/doff right sock, Don/doff left sock                  Lower body assist Assist for lower body dressing:  (mod)  Toileting Toileting     Toileting steps completed by helper: Adjust clothing prior to toileting, Performs perineal hygiene, Adjust clothing after toileting (Per Armeniahina Ingram, NT report)    Toileting assist Assist level: Two helpers (Per Armeniahina Ingram, NT report)   Transfers Chair/bed transfer   Chair/bed transfer method: Squat pivot Chair/bed transfer assist level: Maximal assist (Pt 25 - 49%/lift and lower) Chair/bed transfer  assistive device: Armrests     Locomotion Ambulation Ambulation activity did not occur: Safety/medical concerns         Wheelchair Wheelchair activity did not occur: Safety/medical concerns Type: Manual      Cognition Comprehension Comprehension assist level: Understands basic 90% of the time/cues < 10% of the time  Expression Expression assist level: Expresses basic 50 - 74% of the time/requires cueing 25 - 49% of the time. Needs to repeat parts of sentences.  Social Interaction Social Interaction assist level: Interacts appropriately 50 - 74% of the time - May be physically or verbally inappropriate.  Problem Solving Problem solving assist level: Solves basic 25 - 49% of the time - needs direction more than half the time to initiate, plan or complete simple activities  Memory Memory assist level: Recognizes or recalls 75 - 89% of the time/requires cueing 10 - 24% of the time    Medical Problem List and Plan: 1.  Left hemiparesis, hemisensory loss,  and cognitive deficits secondary to right ACA infarct, small right thalamic infarct on 7/24  Cont CIR PT, OT SLP, team conf in am 2.  DVT Prophylaxis/Anticoagulation: Pharmaceutical: Lovenox 3. H/o lumbago/Pain Management: Denies pain at this moment. Tylenol prn 4. Mood: LCSW to follow for evaluation and support.  5. Neuropsych: This patient is not capable of making decisions on his  own behalf. 6. Skin/Wound Care: routine pressure relief measures 7. Fluids/Electrolytes/Nutrition: Monitor I/Os--offer supplements if intake poor.   8.T2DM: monitor BS ac/hs. Continue lantus daily with meal coverage. Will use SSI for elevated BS.    CBG (last 3) not being monitored will check ac TID  Recent Labs  02/14/17 1642 02/14/17 2029 02/15/17 0603  GLUCAP 163* 222* 178*    9. HTN:  Monitor BP bid  Norvasc 5mg  daily, increased to 7.5 on 7/28, will increase to 10mg  7/30- monitor for effect   Vitals:   02/14/17 1409 02/15/17 0500  BP: (!)  158/75 (!) 179/82  Pulse: 81 84  Resp: 20 18  Temp: 99.3 F (37.4 C) 98.2 F (36.8 C)   10 H/o urinary retention: currently incontinent. Toilet patient every 4 hours and cath if retention noted.   11. Hyperlipidemia: On atorvastatin.  12. Question CKD: baseline SCr around 1.6 per records review. Continue to monitor.  Offer fluids between meals for adequate hydration.   Cr. 1.30 on 7/28  Cont to monitor 13. Hyponatremia  Sodium 134 on 7/28  Continue to monitor 14. Hypoalbuminemia  Supplement initiated 7/28 15. Acute blood loss anemia  Hemoglobin 12.4 on 7/28  Continue to monitor 16.  Spasticity- Left side , L hanstring most affected start night time zanaflex 17.  Urinary incont- occ retention now on flomax with low PVR this am, cannot inform nsg of need to void LOS (Days) 4 A FACE TO FACE EVALUATION WAS PERFORMED  Claudette LawsKIRSTEINS,Keishawna Carranza E 02/15/2017 7:22 AM

## 2017-02-15 NOTE — Progress Notes (Signed)
Social Work Social Work Assessment and Plan  Patient Details  Name: Frank Moss MRN: [id number] Date of Birth: 04/24/1971  Today's Date: 02/15/2017  Problem List:  Patient Active Problem List   Diagnosis Date Noted  . Diabetes mellitus type 2 in nonobese (HCC)   . Hypertensive crisis   . Diabetes mellitus (HCC)   . Benign essential HTN   . Urinary retention   . Stage 3 chronic kidney disease   . Hyponatremia   . Hypoalbuminemia due to protein-calorie malnutrition (HCC)   . Acute blood loss anemia   . Acute thalamic infarction (HCC) 02/11/2017  . Left hemiparesis (HCC) 02/10/2017  . Cognitive deficit due to recent stroke 02/10/2017  . Hemi-neglect of left side 02/10/2017  . CKD (chronic kidney disease), stage III 02/09/2017  . Dysphagia as late effect of stroke 02/08/2017  . Acute ischemic right ACA stroke (HCC) 01/31/2017  . Mixed hyperlipidemia 11/17/2007  . Accelerated hypertension 11/17/2007  . DM (diabetes mellitus), type 2 with peripheral vascular complications (HCC) 10/09/2007  . ERECTILE DYSFUNCTION, MILD 10/09/2007  . SHOULDER PAIN, LEFT 10/09/2007  . COLONIC POLYPS, HX OF 10/09/2007   Past Medical History:  Past Medical History:  Diagnosis Date  . Abnormal ECG   . Acute ischemic right ACA stroke (HCC)   . Coronary artery disease, occlusive   . Dysfunction of eustachian tube   . History of right ACA stroke   . Hyperlipidemia associated with type 2 diabetes mellitus (HCC)   . Hypertension   . Impotence of organic origin   . Lumbago   . Lymphadenitis    Unspecified, except mesenteric  . Type 2 diabetes, uncontrolled, with retinopathy (HCC)   . Urinary retention 01/2017   Past Surgical History:  Past Surgical History:  Procedure Laterality Date  . TEE WITHOUT CARDIOVERSION N/A 02/11/2017   Procedure: TRANSESOPHAGEAL ECHOCARDIOGRAM (TEE);  Surgeon: Quintella Reicherturner, Traci R, MD;  [facility];  Service: Cardiovascular;  Laterality: N/A;  . TEE WITHOUT  CARDIOVERSION N/A 02/11/2017   Procedure: TRANSESOPHAGEAL ECHOCARDIOGRAM (TEE);  Surgeon: Quintella Reicherturner, Traci R, MD;  [facility];  Service: Cardiovascular;  Laterality: N/A;   Social History:  reports that he has never smoked. He has never used smokeless tobacco. He reports that he does not drink alcohol or use drugs.  Family / Support Systems Marital Status: Married Patient Roles: Spouse, Parent Spouse/Significant Other: wife, Frank CockingMiriam Moss@ [phone] or Frank Moss(C) [phone] Children: adult daughter, Frank IvoryGabrielle and 196 yo son, Frank Hollingsheadlexander Anticipated Caregiver: Spouse, daughter, and daughter's partner  Ability/Limitations of Caregiver: spouse works but all together they can provide 24/7 Caregiver Availability: 24/7 Family Dynamics: Pt describes all family as very supportive and feels they can provide assistance.  Wife and daughter are very involved/ concerned and good advocates for pt.  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  Is fatigued from full day of therapies.  Denies any s/s of emotional distress, however, will request neuropsychology consult. Recent Psychosocial Issues: None Pyschiatric  History: None Substance Abuse History: None  Patient / Family Perceptions, Expectations & Goals Pt/Family understanding of illness & functional  limitations: Pt with basic understanding of his CVA and current functional limitations/ need for CIR.  Wife and daughter with good understanding of the extent of his stroke and deficits. Premorbid pt/family roles/activities: Pt completely independent and both working f/t. Anticipated changes in roles/activities/participation: Goals being set for min assist overall.  Wife, daughter and dtr's partner are planning to share in provision of 24/7 assist. Pt/family expectations/goals: "I need to get stronger and get back to work."  [employer] Resources Community Agencies: None Premorbid Home Care/DME Agencies: None Transportation available at discharge: yes Resource referrals recommended: Neuropsychology, Support group (specify)  Discharge Planning Living Arrangements: Spouse/significant other, Children Support Systems: Spouse/significant other, Children, Friends/neighbors Type of Residence: Private residence Civil engineer, contractingnsurance Resources: Media plannerrivate Insurance (specify) Counselling psychologist([employer]) Financial Resources: Employment Surveyor, quantityinancial Screen Referred: No Living Expenses: Database administratorMotgage Money Management: Patient Does the patient have any problems obtaining your medications?: No Home Management: pt and family Patient/Family Preliminary Plans: Pt to d/c home with family to provide 24/7 assistance. Social Work Anticipated Follow Up Needs: HH/OP Expected length of stay: 21-25 days   Clinical Impression Unfortunate gentleman here following a CVA and with significant deficits.  His wife and family are supportive but all still working f/t.  They are making arrangements to provide 24/7 support given min assist goals.  Pt denies any s/s of emotional distress, however, this is a major life change and will have neuropsychologist consult.  Will follow for support and d/c planning needs.  Frank Moss 02/15/2017, 4:04

## 2017-02-16 ENCOUNTER — Inpatient Hospital Stay (HOSPITAL_COMMUNITY): Payer: 59 | Admitting: Occupational Therapy

## 2017-02-16 ENCOUNTER — Inpatient Hospital Stay (HOSPITAL_COMMUNITY): Payer: 59 | Admitting: Physical Therapy

## 2017-02-16 ENCOUNTER — Inpatient Hospital Stay (HOSPITAL_COMMUNITY): Payer: 59 | Admitting: Speech Pathology

## 2017-02-16 LAB — GLUCOSE, CAPILLARY
GLUCOSE-CAPILLARY: 176 mg/dL — AB (ref 65–99)
Glucose-Capillary: 143 mg/dL — ABNORMAL HIGH (ref 65–99)
Glucose-Capillary: 108 mg/dL — ABNORMAL HIGH (ref 65–99)
Glucose-Capillary: 227 mg/dL — ABNORMAL HIGH (ref 65–99)

## 2017-02-16 LAB — CBC
HEMATOCRIT: 34.9 % — AB (ref 39.0–52.0)
HEMOGLOBIN: 11.7 g/dL — AB (ref 13.0–17.0)
MCH: 26.6 pg (ref 26.0–34.0)
MCHC: 33.5 g/dL (ref 30.0–36.0)
MCV: 79.3 fL (ref 78.0–100.0)
Platelets: 264 10*3/uL (ref 150–400)
RBC: 4.4 MIL/uL (ref 4.22–5.81)
RDW: 13.3 % (ref 11.5–15.5)
WBC: 5.3 10*3/uL (ref 4.0–10.5)

## 2017-02-16 LAB — BASIC METABOLIC PANEL
ANION GAP: 6 (ref 5–15)
BUN: 21 mg/dL — ABNORMAL HIGH (ref 6–20)
CALCIUM: 8.7 mg/dL — AB (ref 8.9–10.3)
CO2: 30 mmol/L (ref 22–32)
CREATININE: 1.28 mg/dL — AB (ref 0.61–1.24)
Chloride: 98 mmol/L — ABNORMAL LOW (ref 101–111)
GFR calc Af Amer: >60 mL/min (ref >60)
GFR calc non Af Amer: >60 mL/min (ref >60)
Glucose, Bld: 139 mg/dL — ABNORMAL HIGH (ref 65–99)
Potassium: 4.1 mmol/L (ref 3.5–5.1)
Sodium: 134 mmol/L — ABNORMAL LOW (ref 135–145)

## 2017-02-16 MED ORDER — LIDOCAINE HCL 2 % EX GEL
CUTANEOUS | Status: DC | PRN
  Administered 2017-02-28 – 2017-03-02 (×3): 5 via TOPICAL
  Filled 2017-02-16: qty 10
  Filled 2017-02-16 (×10): qty 5

## 2017-02-16 MED ORDER — TEMAZEPAM 7.5 MG PO CAPS
7.5000 mg | ORAL_CAPSULE | Freq: Every evening | ORAL | Status: DC | PRN
Start: 2017-02-16 — End: 2017-03-11
  Administered 2017-02-16: 7.5 mg via ORAL
  Filled 2017-02-16: qty 1

## 2017-02-16 MED ORDER — INSULIN GLARGINE 100 UNIT/ML ~~LOC~~ SOLN
20.0000 [IU] | Freq: Every day | SUBCUTANEOUS | Status: DC
  Administered 2017-02-16: 20 [IU] via SUBCUTANEOUS
  Filled 2017-02-16: qty 0.2

## 2017-02-16 NOTE — Progress Notes (Signed)
Physical Therapy Session Note  Patient Details  Name: Frank Moss MRN: [id number] Date of Birth: 01/10/1971  Today's Date: 02/16/2017 PT Individual Time: 1300-1400 PT Individual Time Calculation (min): 60 min   Short Term Goals: Week 1:  PT Short Term Goal 1 (Week 1): Pt will perform bed mobility with max assist of 1 consistently  PT Short Term Goal 2 (Week 1): Pt Will perform squat pivot transfer with total assist of 1 consistently  PT Short Term Goal 3 (Week 1): Pt will initate WC mobility  PT Short Term Goal 4 (Week 1): Pt will perform sit<>stand with max assist of one consistently   Skilled Therapeutic Interventions/Progress Updates:    no c/o pain, pt scooted self to far L side of w/c while in room and had overpowered R ELR causing it to crack and be positioned in swing away position.  Pt able to reposition in chair with max multimodal cues.  Session focus on attention, visual scanning, sustained L hamstring/calf stretch, and transfers.    Pt taken to day room for quieter environment and engaged in mildly complex sorting task with mod multimodal cues for scanning, attention, and use of LUE.  Sit<>stand with +1 mod assist in hallway, standing tolerance x2 minutes with mirror for visual feedback, +2 assist for safety with mutlimodal cues for R weight shift and LLE extension.  Transition to standing frame and pt able to stand to table with min assist, therapist applied sling to maintain pt upright for prolonged hamstring stretch.  Pt tolerates x5 minutes and able to maintain L heel contact with floor about 50% of the time but continues to demonstrate L knee flexion.  Noted decreased in tone affecting therapy today.  Pt returned to room in w/c at end of session for handoff to OT.  Therapy Documentation Precautions:  Precautions Precautions: Fall Precaution Comments: pusher to the left, apraxia, decreased initiation, left hemiparesis Restrictions Weight Bearing Restrictions: No    See  Function Navigator for Current Functional Status.   Therapy/Group: Individual Therapy  Ladora DanielCaitlin E Penven-Crew 02/16/2017, 5:25 PM

## 2017-02-16 NOTE — Progress Notes (Signed)
Speech Language Pathology Daily Session Note  Patient Details  Name: Frank Moss MRN: [id number] Date of Birth: 1971-06-09  Today's Date: 02/16/2017 SLP Individual Time: 0900-1000 SLP Individual Time Calculation (min): 60 min  Short Term Goals: Week 1: SLP Short Term Goal 1 (Week 1): Pt will initiate and complete tasks in a timely manner during basic functional tasks with mod assist verbal cues.   SLP Short Term Goal 2 (Week 1): Pt will complete basic, familiar tasks with mod assist verbal cues for functional problem solving.   SLP Short Term Goal 3 (Week 1): Pt will utilize external aids to recall basic, daily information with mod assist verbal cues.   SLP Short Term Goal 4 (Week 1): Pt will sustain his attention to basic, familiar tasks for 5-7 minutes with mod assist verbal cues for redirection.    Skilled Therapeutic Interventions: Skilled treatment session focused on cognitive goals. SLP facilitated session by providing Mod A verbal cues for problem solving during a 6 step picture sequencing task and for sustained attention to task for ~2 minute intervals. Patient initiated task with supervision verbal cues. Patient more verbal and engaged this session. Patient left upright in wheelchair with quick release belt in place and all needs within reach. Continue with current plan of care.      Function:   Cognition Comprehension Comprehension assist level: Understands basic 90% of the time/cues < 10% of the time  Expression   Expression assist level: Expresses basic 50 - 74% of the time/requires cueing 25 - 49% of the time. Needs to repeat parts of sentences.  Social Interaction Social Interaction assist level: Interacts appropriately 50 - 74% of the time - May be physically or verbally inappropriate.  Problem Solving Problem solving assist level: Solves basic 25 - 49% of the time - needs direction more than half the time to initiate, plan or complete simple activities  Memory Memory assist  level: Recognizes or recalls 50 - 74% of the time/requires cueing 25 - 49% of the time    Pain No/Denies Pain   Therapy/Group: Individual Therapy  [name] 02/16/2017, 11:17 AM

## 2017-02-16 NOTE — Progress Notes (Signed)
Occupational Therapy Session Note  Patient Details  Name: Frank Moss MRN: [id number] Date of Birth: 08/25/1970  Today's Date: 02/16/2017  Session 1 OT Individual Time: 0800-0900 OT Individual Time Calculation (min): 60 min   Session 2 OT Individual Time: 1404-1432 OT Individual Time Calculation (min): 28 min    Short Term Goals: Week 1:  OT Short Term Goal 1 (Week 1): Pt will maintain static sitting balance EOB with supervision for 3 mins in preparation for transfers or selfcare.  OT Short Term Goal 2 (Week 1): Pt will maintain dynamic sitting balance unsupported EOB or EOC with min assist during bathing and dressing tasks.  OT Short Term Goal 3 (Week 1): Pt will complete UB dressing with min assist to donn pullover shirt.  OT Short Term Goal 4 (Week 1): Pt will donn pull up pants with mod assist sit to stand.  OT Short Term Goal 5 (Week 1): Pt will use the LUE as an active assist for bathing tasks with no more than mod assist.   Skilled Therapeutic Interventions/Progress Updates:  Session 1  OT treatment session focused on L side attention,  Functional use of L UE, initiation, attention, within bathing/dressing tasks. Attempted to come sup<>sit on R side of bed, but pt with difficult head-hips relationship and difficulty advancing L side of body toward R side 2/2 L hip flexor tone and pain. Sup<>sit on L side of bed with Max A. Max A sitting balance and trunk control while pt reaching forward to try to thread pant leg. Total A to thread L pant leg and max for R. Multimodal cued and increased time to initiate. Sit<>stand using STEDY with increased time to initiate and Max A. Pt with increased pain and flexor tone in LLE when standing. Facilitated weight bearing through L LE with reaching task. Pt transferred onto toilet but unable to void bowel or bladder MaX A to stand in STEDY and total A for clothing management. UB bathing completed sitting in STEDY at the sink with instructional cues for  each step of task. Improved initiated of use of L UE with guided assist. Pt left seated in TIS wc at end of session with safety belt on and needs met.   Session 2 Pt greeted handoff from PT. OT treatment session focused on B UE coordination, attention, and initiation within functional towel folding task.  Incorporated music therapy with pt preferred music of :Prince" Pt more engaged this afternoon, asking therapist questions about herself and engaged in music. Guided A to integrate L UE within folding and reaching task. Pt then completed stand-pivot back to bed on R side with increased time to initiate and mod A. Max A to lift B LE's back into bed. Pt left with call bell and bed alarm on.   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   Therapy/Group: Individual Therapy   S  02/16/2017, 2:34 PM 

## 2017-02-16 NOTE — Progress Notes (Addendum)
[place] PHYSICAL MEDICINE & REHABILITATION     PROGRESS NOTE  Subjective/Complaints:  No issues overnite except cathed x 2 . Voided during day yesterday   ROS: Denies CP, SOB, nausea, vomiting, diarrhea.  Objective: Vital Signs: Blood pressure (!) 145/72, pulse 83, temperature 98.9 F (37.2 C), temperature source Oral, resp. rate 18, height _0  (1.88 m), weight 103 kg (227 lb 0.7 oz), SpO2 99 %. No results found. No results for input(s): WBC, HGB, HCT, PLT in the last 72 hours. No results for input(s): NA, K, CL, GLUCOSE, BUN, CREATININE, CALCIUM in the last 72 hours.  Invalid input(s): CO CBG (last 3)   Recent Labs  02/15/17 1627 02/15/17 2153 02/16/17 0635  GLUCAP 199* 224* 143*    Wt Readings from Last 3 Encounters:  02/16/17 103 kg (227 lb 0.7 oz)  02/08/17 103.7 kg (228 lb 11.2 oz)  02/08/17 124.3 kg (274 lb)    Physical Exam:  BP (!) 145/72 (BP Location: Right Arm)   Pulse 83   Temp 98.9 F (37.2 C) (Oral)   Resp 18   Ht _1  (1.88 m)   Wt 103 kg (227 lb 0.7 oz)   SpO2 99%   BMI 29.15 kg/m  Constitutional: He appears well-developedand well-nourished.  HENT: Normocephalicand atraumatic.  Eyes: EOMI. No discharge. Cardiovascular: RRR. No JVD. Respiratory: Effort normal breath sounds normal.  GI: Soft. Bowel sounds are normal.   Neurological: He is alertand oriented.  Left facial weakness with left gaze preference and right neglect, improving.  Delayed processing with slow speech. His attention is poor and he lacks awareness/insight into deficits. Spastic left hemiparesis  Motor: Left lower extremity: 2+/5 proximal to distal Skin: Skin is warmand dry.  Psychiatric: His affect is blunt. His speech is delayed. He is slowed. He expresses inappropriate judgment. He is inattentive.    Assessment/Plan: 1. 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 Physiatrist is providing close team supervision and 24 hour management of active medical problems listed below. Physiatrist and rehab team continue to assess barriers to discharge/monitor patient progress toward functional and medical goals.  Function:  Bathing Bathing position   Position:  (UB in steady / LB in bed)  Bathing parts Body parts bathed by patient: Left arm, Chest, Abdomen, Front perineal area, Right upper leg, Left upper leg Body parts bathed by helper: Right lower leg, Left lower leg, Back, Buttocks, Right arm  Bathing assist Assist Level: Touching or steadying assistance(Pt > 75%)      Upper Body Dressing/Undressing Upper body dressing   What is the patient wearing?: Pull over shirt/dress     Pull over shirt/dress - Perfomed by patient: Put head through opening, Thread/unthread right sleeve Pull over shirt/dress - Perfomed by helper: Pull shirt over trunk, Thread/unthread left sleeve        Upper body assist Assist Level: Touching or steadying assistance(Pt > 75%)      Lower Body Dressing/Undressing Lower body dressing   What is the patient wearing?: Pants, Non-skid slipper socks, Shoes     Pants- Performed by patient: Thread/unthread right pants leg Pants- Performed by helper: Thread/unthread right pants leg, Thread/unthread left pants leg, Pull pants up/down   Non-skid slipper socks- Performed by helper: Don/doff right sock, Don/doff left sock       Shoes - Performed by helper: Don/doff right shoe, Don/doff left shoe, Fasten right, [employer]  left          Lower body assist Assist for lower body dressing: Touching or steadying assistance (Pt > 75%)      Toileting Toileting     Toileting steps completed by helper: Adjust clothing prior to toileting, Adjust clothing after toileting, Performs perineal hygiene    Toileting assist Assist level: Touching or steadying assistance (Pt.75%)   Transfers Chair/bed  transfer   Chair/bed transfer method: Squat pivot, Lateral scoot Chair/bed transfer assist level: 2 helpers Chair/bed transfer assistive device: Sliding board     Locomotion Ambulation Ambulation activity did not occur: Safety/medical concerns         Wheelchair Wheelchair activity did not occur: Safety/medical concerns Type: Manual      Cognition Comprehension Comprehension assist level: Understands basic 90% of the time/cues < 10% of the time  Expression Expression assist level: Expresses basic 50 - 74% of the time/requires cueing 25 - 49% of the time. Needs to repeat parts of sentences.  Social Interaction Social Interaction assist level: Interacts appropriately 50 - 74% of the time - May be physically or verbally inappropriate.  Problem Solving Problem solving assist level: Solves basic 25 - 49% of the time - needs direction more than half the time to initiate, plan or complete simple activities  Memory Memory assist level: Recognizes or recalls 50 - 74% of the time/requires cueing 25 - 49% of the time    Medical Problem List and Plan: 1.  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 Formulized individual treatment plan based on medical history, underlying problem and comorbidities. 2.  DVT Prophylaxis/Anticoagulation: Pharmaceutical: Lovenox 3. H/o lumbago/Pain Management: Denies pain at this moment. Tylenol prn 4. Mood: LCSW to follow for evaluation and support.  5. Neuropsych: This patient is not capable of making decisions on his  own behalf. 6. Skin/Wound Care: routine pressure relief measures 7. Fluids/Electrolytes/Nutrition: Monitor I/Os--offer supplements if intake poor.   8.T2DM: monitor BS ac/hs. Continue lantus  daily with meal coverage. Will use SSI for elevated BS.    CBG (last 3) not being monitored will check ac TID, Increase lantus to 20U  Recent Labs  02/15/17 1627 02/15/17 2153 02/16/17 0635  GLUCAP 199* 224* 143*    9. HTN:  Monitor BP bid  Norvasc 59m daily, increased to 7.5 on 7/28, will increase to 128m7/30- monitor for effect, systolic improving this am  Vitals:   02/15/17 2300 02/16/17 0515  BP: (!) 155/72 (!) 145/72  Pulse:  83  Resp:  18  Temp:  98.9 F (37.2 C)   10 H/o urinary retention: currently incontinent. Toilet patient every 4 hours and cath if retention noted.   11. Hyperlipidemia: On atorvastatin.  12. Question CKD: baseline SCr around 1.6 per records review. Continue to monitor.  Offer fluids between meals for adequate hydration.   Cr. 1.30 on 7/28  Cont to monitor 13. Hyponatremia  Sodium 134 on 7/28  Continue to monitor 14. Hypoalbuminemia  Supplement initiated 7/28 15. Acute blood loss anemia  Hemoglobin 12.4 on 7/28  Continue to monitor 16.  Spasticity- Left side , L hanstring most affected has daytime spasticity as well on TID zanaflex as of 8/1 17.  Urinary incont- occ retention now on flomax , voids during the day, add  urecholine LOS (Days) 5 A FACE TO FACE EVALUATION WAS PERFORMED  Charlett Blake 02/16/2017 6:47 AM

## 2017-02-17 ENCOUNTER — Inpatient Hospital Stay (HOSPITAL_COMMUNITY): Payer: 59 | Admitting: Speech Pathology

## 2017-02-17 ENCOUNTER — Inpatient Hospital Stay (HOSPITAL_COMMUNITY): Payer: 59 | Admitting: Occupational Therapy

## 2017-02-17 ENCOUNTER — Encounter (HOSPITAL_COMMUNITY): Payer: Self-pay

## 2017-02-17 ENCOUNTER — Inpatient Hospital Stay (HOSPITAL_COMMUNITY): Payer: 59 | Admitting: Physical Therapy

## 2017-02-17 LAB — GLUCOSE, CAPILLARY
GLUCOSE-CAPILLARY: 142 mg/dL — AB (ref 65–99)
Glucose-Capillary: 151 mg/dL — ABNORMAL HIGH (ref 65–99)
Glucose-Capillary: 230 mg/dL — ABNORMAL HIGH (ref 65–99)
Glucose-Capillary: 121 mg/dL — ABNORMAL HIGH (ref 65–99)

## 2017-02-17 LAB — URINE CULTURE

## 2017-02-17 MED ORDER — BETHANECHOL CHLORIDE 10 MG PO TABS
10.0000 mg | ORAL_TABLET | Freq: Three times a day (TID) | ORAL | Status: DC
  Administered 2017-02-17 (×3): 10 mg via ORAL
  Filled 2017-02-17 (×3): qty 1

## 2017-02-17 MED ORDER — INSULIN GLARGINE 100 UNIT/ML ~~LOC~~ SOLN
25.0000 [IU] | Freq: Every day | SUBCUTANEOUS | Status: DC
  Administered 2017-02-17 – 2017-02-18 (×2): 25 [IU] via SUBCUTANEOUS
  Filled 2017-02-17 (×2): qty 0.25

## 2017-02-17 MED ORDER — TIZANIDINE HCL 2 MG PO TABS
2.0000 mg | ORAL_TABLET | Freq: Two times a day (BID) | ORAL | Status: DC
  Administered 2017-02-17 – 2017-03-11 (×45): 2 mg via ORAL
  Filled 2017-02-17 (×44): qty 1

## 2017-02-17 MED ORDER — TIZANIDINE HCL 2 MG PO TABS
4.0000 mg | ORAL_TABLET | Freq: Every day | ORAL | Status: DC
  Administered 2017-02-17 – 2017-03-10 (×22): 4 mg via ORAL
  Filled 2017-02-17 (×22): qty 2

## 2017-02-17 NOTE — Progress Notes (Addendum)
Inpatient Diabetes Program Recommendations  AACE/ADA: New Consensus Statement on Inpatient Glycemic Control (2015)  Target Ranges:  Prepandial:   less than 140 mg/dL      Peak postprandial:   less than 180 mg/dL (1-2 hours)      Critically ill patients:  140 - 180 mg/dL   Lab Results  Component Value Date   GLUCAP 151 (H) 02/17/2017   HGBA1C 11.2 01/21/2017    Review of Glycemic Control  Diabetes history: DM2 Outpatient Diabetes medications: Lantus 15 units QHS, Novolog 0-15 units tidwc and HS Current orders for Inpatient glycemic control: Lantus 25 units QHS, Novolog 0-15 units tidwc and hs  May benefit from meal coverage insulin.  Inpatient Diabetes Program Recommendations:    Add Novolog 3 units tidwc if pt eats > 50% meal.  Will speak with pt and family about importance of healthy diet, counting CHOs, eating a variety of foods throughout the day.  Will continue to follow.  Thank you. Ailene Ardshonda Anvi Mangal, RD, LDN, CDE Inpatient Diabetes Coordinator [phone]  Addendum: Family unavailable this afternoon. Will f/u in am.

## 2017-02-17 NOTE — Progress Notes (Signed)
[place] PHYSICAL MEDICINE & REHABILITATION     PROGRESS NOTE  Subjective/Complaints:     ROS: Denies CP, SOB, nausea, vomiting, diarrhea.  Objective: Vital Signs: Blood pressure (!) 170/81, pulse 85, temperature 98.5 F (36.9 C), temperature source Oral, resp. rate 16, height 6\' 2"  (1.88 m), weight 103 kg (227 lb 0.7 oz), SpO2 99 %. No results found.  Recent Labs  02/16/17 0510  WBC 5.3  HGB 11.7*  HCT 34.9*  PLT 264    Recent Labs  02/16/17 0510  NA 134*  K 4.1  CL 98*  GLUCOSE 139*  BUN 21*  CREATININE 1.28*  CALCIUM 8.7*   CBG (last 3)   Recent Labs  02/16/17 1644 02/16/17 2108 02/17/17 0604  GLUCAP 227* 176* 151*    Wt Readings from Last 3 Encounters:  02/16/17 103 kg (227 lb 0.7 oz)  02/08/17 103.7 kg (228 lb 11.2 oz)  02/08/17 124.3 kg (274 lb)    Physical Exam:  BP (!) 170/81 (BP Location: Right Arm)   Pulse 85   Temp 98.5 F (36.9 C) (Oral)   Resp 16   Ht 6\' 2"  (1.88 m)   Wt 103 kg (227 lb 0.7 oz)   SpO2 99%   BMI 29.15 kg/m  Constitutional: He appears well-developedand well-nourished.  HENT: Normocephalicand atraumatic.  Eyes: EOMI. No discharge. Cardiovascular: RRR. No JVD. Respiratory: Effort normal breath sounds normal.  GI: Soft. Bowel sounds are normal.   Neurological: He is alertand oriented.  Left facial weakness with left gaze preference and right neglect, improving.  Delayed processing with slow speech. His attention is poor and he lacks awareness/insight into deficits. Spastic left hemiparesis  Motor: Left lower extremity: 2+/5 proximal to distal Skin: Skin is warmand dry.  Psychiatric: His affect is blunt. His speech is delayed. He is slowed. He expresses inappropriate judgment. He is inattentive.    Assessment/Plan: 1. 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 Physiatrist is providing close team supervision and 24 hour management of active medical problems listed below. Physiatrist and rehab team continue to assess barriers to discharge/monitor patient progress toward functional and medical goals.  Function:  Bathing Bathing position   Position:  (UB in steady / LB in bed)  Bathing parts Body parts bathed by patient: Left arm, Chest, Abdomen, Front perineal area, Right upper leg, Left upper leg Body parts bathed by helper: Right lower leg, Left lower leg, Back, Buttocks, Right arm  Bathing assist Assist Level: Touching or steadying assistance(Pt > 75%)      Upper Body Dressing/Undressing Upper body dressing   What is the patient wearing?: Pull over shirt/dress     Pull over shirt/dress - Perfomed by patient: Put head through opening, Thread/unthread right sleeve Pull over shirt/dress - Perfomed by helper: Pull shirt over trunk, Thread/unthread left sleeve        Upper body assist Assist Level: Touching or steadying assistance(Pt > 75%)      Lower Body Dressing/Undressing Lower body dressing   What is the patient wearing?: Pants, Non-skid slipper socks, Shoes     Pants- Performed by patient: Thread/unthread right pants leg Pants- Performed by helper: Thread/unthread right pants leg, Thread/unthread left pants leg, Pull pants up/down   Non-skid slipper socks- Performed by helper: Don/doff right sock, Don/doff left sock       Shoes - Performed by helper: Don/doff right shoe, Don/doff  left shoe, Fasten right, Fasten left          Lower body assist Assist for lower body dressing: Touching or steadying assistance (Pt > 75%)      Toileting Toileting     Toileting steps completed by helper: Adjust clothing prior to toileting, Adjust clothing after toileting, Performs perineal hygiene    Toileting assist Assist level: Touching or steadying assistance (Pt.75%)   Transfers Chair/bed transfer   Chair/bed transfer  method: Squat pivot, Lateral scoot Chair/bed transfer assist level: 2 helpers Chair/bed transfer assistive device: Sliding board     Locomotion Ambulation Ambulation activity did not occur: Safety/medical concerns         Wheelchair Wheelchair activity did not occur: Safety/medical concerns Type: Manual      Cognition Comprehension Comprehension assist level: Understands basic 90% of the time/cues < 10% of the time  Expression Expression assist level: Expresses basic 50 - 74% of the time/requires cueing 25 - 49% of the time. Needs to repeat parts of sentences.  Social Interaction Social Interaction assist level: Interacts appropriately 50 - 74% of the time - May be physically or verbally inappropriate.  Problem Solving Problem solving assist level: Solves basic 25 - 49% of the time - needs direction more than half the time to initiate, plan or complete simple activities  Memory Memory assist level: Recognizes or recalls 50 - 74% of the time/requires cueing 25 - 49% of the time    Medical Problem List and Plan: 1.  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  DVT Prophylaxis/Anticoagulation: Pharmaceutical: Lovenox 3. H/o lumbago/Pain Management: Denies pain at this moment. Tylenol prn 4. Mood: LCSW to follow for evaluation and support.  5. Neuropsych: This patient is not capable of making decisions on his  own behalf. 6. Skin/Wound Care: routine pressure relief measures 7. Fluids/Electrolytes/Nutrition: Monitor I/Os--offer supplements if intake poor.   8.T2DM: monitor BS ac/hs. Continue lantus daily with meal coverage. Will use SSI for elevated BS.    CBG (last 3) not being monitored will check ac TID, Increase lantus to 25U  Recent Labs  02/16/17 1644 02/16/17 2108 02/17/17 0604  GLUCAP 227* 176* 151*    9. HTN:  Monitor BP bid  Norvasc 5mg  daily, increased to  7.5 on 7/28, will increase to 10mg  7/30- monitor for effect, systolic improving this am  Vitals:   02/16/17 1502 02/17/17 0405  BP: (!) 190/85 (!) 170/81  Pulse: 91 85  Resp: 18 16  Temp: 98.6 F (37 C) 98.5 F (36.9 C)   10 H/o urinary retention: currently incontinent. Toilet patient every 4 hours and cath if retention noted.   11. Hyperlipidemia: On atorvastatin.  12. Question CKD: baseline SCr around 1.6 per records review. Continue to monitor.  Offer fluids between meals for adequate hydration.   Cr. 1.30 on 7/28  Cont to monitor 13. Hyponatremia  Sodium 134 on 7/28  Continue to monitor 14. Hypoalbuminemia  Supplement initiated 7/28 15. Acute blood loss anemia  Hemoglobin 12.4 on 7/28  Continue to monitor 16.  Spasticity- Left side , L hanstring most affected has daytime spasticity , Increase zanaflex to 2mg  BID and 4mg  Qhs 17.  Urinary incont- occ retention now on flomax , voids during the day,  Urecholine 10mg  TID  LOS (Days) 6 A FACE TO FACE EVALUATION WAS PERFORMED  Erick ColaceKIRSTEINS,Matisyn Cabeza E 02/17/2017 7:43 AM

## 2017-02-17 NOTE — Progress Notes (Signed)
Occupational Therapy Session Note  Patient Details  Name: Frank Moss MRN: [id number] Date of Birth: [birth year]  Today's Date: 02/17/2017 OT Individual [id number]:[id number] and 1300-1330 OT Individual Time Calculation (min): 67 min and 30 min    Short Term Goals: Week 1:  OT Short Term Goal 1 (Week 1): Pt will maintain static sitting balance EOB with supervision for 3 mins in preparation for transfers or selfcare.  OT Short Term Goal 2 (Week 1): Pt will maintain dynamic sitting balance unsupported EOB or EOC with min assist during bathing and dressing tasks.  OT Short Term Goal 3 (Week 1): Pt will complete UB dressing with min assist to donn pullover shirt.  OT Short Term Goal 4 (Week 1): Pt will donn pull up pants with mod assist sit to stand.  OT Short Term Goal 5 (Week 1): Pt will use the LUE as an active assist for bathing tasks with no more than mod assist.   Skilled Therapeutic Interventions/Progress Updates:    Session 1 Pt received supine in bed, agreeable to tx session. Pt's spouse present during session and offering encouragement intermittently. Focus of session on ADL/self-care retraining. Pt completed supine to sitting EOB with MaxA for trunk/LE management and increased time to initiate. [facility] sit<>[facility] Pt completed UB/LB bathing seated in [place], with overall MinA-ModA for sitting balance during task completion, intermittently able to maintain sitting balance with supervision, requires ModA when Pt leaning more to L. Pt requires assist to wash back, buttocks, lower LEs and multimodal cues throughout to sequence/complete task. Pt utilizes LUE to wash R armpit and R forearm initially with [facility] assist and progressing to completing independently. Pt with MaxA for sit<>[facility] bathing with Mod-MaxA to maintain standing balance. Transferred Pt to w/c via Stedy to complete UB/LB dressing. Multimodal cues for completing hemi dressing technique for shirt with ModA to  complete, requires totalA for LB dressing, completing at sit<>stand level with Stedy. Pt left semi-reclined in w/c end of session, QRB donned, call bell and needs within reach.   Session 2 Pt presented sitting up in w/c, agreeable to OT tx session. TotalA via w/c to dayroom for time management where Pt participated in mutiple tabletop activities with focus on attention, visual scanning, cognitive remediation, and LUE NMR. Pt completed card activity scanning, matching and sorting. Pt completed matching activity with only 1 error requiring min verbal cue to correct. Completes sorting task with increased time. Engaged Pt in reaching and grasping activity utilizing LUE. Pt requires multimodal and questioning cues to initiate movement of L UE, HOH assist to facilitate grasping and placing objects. Transported Pt back to room in manner described above where Pt was left seated in w/c with handoff to NT to complete vitals, needs within reach and QRB donned.   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   Therapy/Group: Individual Therapy  Orlando PennerBreanna L Sienna Stonehocker 02/17/2017, 3:16 PM

## 2017-02-17 NOTE — Progress Notes (Signed)
Speech Language Pathology Daily Session Note  Patient Details  Name: Frank Moss MRN: [id number] Date of Birth: 25-May-1971  Today's Date: 02/17/2017 SLP Individual Time: [id number] SLP Individual Time Calculation (min): 45 min  Short Term Goals: Week 1: SLP Short Term Goal 1 (Week 1): Pt will initiate and complete tasks in a timely manner during basic functional tasks with mod assist verbal cues.   SLP Short Term Goal 2 (Week 1): Pt will complete basic, familiar tasks with mod assist verbal cues for functional problem solving.   SLP Short Term Goal 3 (Week 1): Pt will utilize external aids to recall basic, daily information with mod assist verbal cues.   SLP Short Term Goal 4 (Week 1): Pt will sustain his attention to basic, familiar tasks for 5-7 minutes with mod assist verbal cues for redirection.    Skilled Therapeutic Interventions: Skilled treatment session focused on cognitive goals. SLP facilitated session by providing Mod A verbal cues for recall, problem solving, attention to left field of environment, and selective attention in a mildly distracting environment for ~2 minute intervals during a mildly complex, novel task. Patient's wife present and provided encouragement. Patient left upright in bed with wife present. Continue with current plan of care.      Function:   Cognition Comprehension Comprehension assist level: Understands basic 90% of the time/cues < 10% of the time  Expression   Expression assist level: Expresses basic 50 - 74% of the time/requires cueing 25 - 49% of the time. Needs to repeat parts of sentences.  Social Interaction Social Interaction assist level: Interacts appropriately 50 - 74% of the time - May be physically or verbally inappropriate.  Problem Solving Problem solving assist level: Solves basic 25 - 49% of the time - needs direction more than half the time to initiate, plan or complete simple activities  Memory Memory assist level: Recognizes or  recalls 50 - 74% of the time/requires cueing 25 - 49% of the time    Pain No/Denies Pain   Therapy/Group: Individual Therapy  Talissa Apple 02/17/2017, 10:23 AM

## 2017-02-17 NOTE — Progress Notes (Signed)
Physical Therapy Session Note  Patient Details  Name: Frank Moss MRN: [id number] Date of Birth: April 19, 1971  Today's Date: 02/17/2017 PT Individual Time: 1400-1500 PT Individual Time Calculation (min): 60 min   Short Term Goals: Week 1:  PT Short Term Goal 1 (Week 1): Pt will perform bed mobility with max assist of 1 consistently  PT Short Term Goal 2 (Week 1): Pt Will perform squat pivot transfer with total assist of 1 consistently  PT Short Term Goal 3 (Week 1): Pt will initate WC mobility  PT Short Term Goal 4 (Week 1): Pt will perform sit<>stand with max assist of one consistently   Skilled Therapeutic Interventions/Progress Updates:    no c/o pain. PT treatment session focused on L LE NMR during ambulation, task initiation, L UE NMR, sitting posture and sitting balance. Pt sitting in w/c upon arrival with family present, pt agreeable to PT. PT rolled pt in w/c to/from gym. Sit to stand at hallway rail with mod A for lifting and balancing. PT switched pt to a different TIS w/c with a cushion for improved positioning for better alignment. Ambulated 2130ft x 2 at hallway rail, with seated rest break between, with max A for balance. PT assisted with L LE swing phase, L LE placement and L LE stance control with pt lacking full foot flat during stance and ambulating with increased L knee flexion. Squat pivot w/c to/from mat with max A for lifting and pivoting with increased time needed for initiation. Pt performed sitting UE reaching task with min guard A for safety on mat with wedge under R hip for pelvic positioning focused on initiation, L UE NMR, and L trunk elongation. Pt attempted towel folding sitting in same position focused on L UE NMR and initiation with pt able to use L UE <25% of the time. Pt returned to room in TIS w/c with QRB in place and call bell in reach.   Therapy Documentation Precautions:  Precautions Precautions: Fall Precaution Comments: pusher to the left, apraxia,  decreased initiation, left hemiparesis Restrictions Weight Bearing Restrictions: No   See Function Navigator for Current Functional Status.   Therapy/Group: Individual Therapy  Aleen Marston 02/17/2017, 5:01 PM

## 2017-02-17 NOTE — Patient Care Conference (Signed)
Inpatient RehabilitationTeam Conference and Plan of Care Update Date: 02/16/2017   Time: 11:10 AM    Patient Name: Frank Moss      Medical Record Number: [id number]  Date of Birth: [birth year] Sex: Male         Room/Bed: 4M02C/4M02C-01 Payor Info: Payor: [employer] / Plan: Research scientist (life sciences)GENERIC [employer] / Product Type: *No Product type* /    Admitting Diagnosis: CVA  Admit Date/Time:  02/11/2017  6:41 PM Admission Comments: No comment available   Primary Diagnosis:  <principal problem not specified> Principal Problem: <principal problem not specified>  Patient Active Problem List   Diagnosis Date Noted  . Diabetes mellitus type 2 in nonobese (HCC)   . Hypertensive crisis   . Diabetes mellitus (HCC)   . Benign essential HTN   . Urinary retention   . Stage 3 chronic kidney disease   . Hyponatremia   . Hypoalbuminemia due to protein-calorie malnutrition (HCC)   . Acute blood loss anemia   . Acute thalamic infarction (HCC) 02/11/2017  . Left hemiparesis (HCC) 02/10/2017  . Cognitive deficit due to recent stroke 02/10/2017  . Hemi-neglect of left side 02/10/2017  . CKD (chronic kidney disease), stage III 02/09/2017  . Dysphagia as late effect of stroke 02/08/2017  . Acute ischemic right ACA stroke (HCC) 01/31/2017  . Mixed hyperlipidemia 11/17/2007  . Accelerated hypertension 11/17/2007  . DM (diabetes mellitus), type 2 with peripheral vascular complications (HCC) 10/09/2007  . ERECTILE DYSFUNCTION, MILD 10/09/2007  . SHOULDER PAIN, LEFT 10/09/2007  . COLONIC POLYPS, HX OF 10/09/2007    Expected Discharge Date: Expected Discharge Date:  (ELOS 4 weeks)  Team Members Present: Physician leading conference: Dr. Claudette LawsAndrew Kirsteins Social Worker Present: Amada JupiterLucy Matha Masse, LCSW Nurse Present: Chrissie NoaMelanie Barnes, RN PT Present: Teodoro Kilaitlin Penven-Crew, PT OT Present: Kearney HardElisabeth Doe, OT SLP Present: Feliberto Gottronourtney Payne, SLP PPS Coordinator present : Tora DuckMarie Noel, RN, CRRN     Current Status/Progress Goal Weekly Team Focus   Medical   severe dysarthria, Severe Left flexor withdrawl, urinary retention  empty bladder , attend to Left side  bladder emptying, apraxic, doesn't follow swallow prec   Bowel/Bladder   lbm 7/27; prn senna at night 7/31 (prn miralax day 7/31); incont bowel; urine retention with q6 caths  pt will have continent void at least once q shift by end of rehab stay  offer toileting q4 when awake; cath for more than 350ml   Swallow/Nutrition/ Hydration             ADL's   +2 transfers, Max A overall ADL  Min A overall  L NMR, L side attention, awareness, initiation, apraxia   Mobility   supervision-mod A bed mobility, +2 sit to stand, +2 squat pivot transfer  min A bed mobility and transfers, mod A ambulation with LRAD and stairs, supervision w/c mobility  L attention/visual scanning, sitting balance, midline orientation in sitting, transfers   Communication             Safety/Cognition/ Behavioral Observations  Mod-Max A  Min A  attention, memory, problem solving    Pain   denies         Skin   CDI          Rehab Goals Patient on target to meet rehab goals: Yes *See Care Plan and progress notes for long and short-term goals.     Barriers to Discharge  Current Status/Progress Possible Resolutions Date Resolved   Physician    Medical stability  apraxic, severe flexor spasticity  slow progress, urinary retention, spasticity  Spasticity meds      Nursing  Medical stability;Incontinence;Nutrition means  incontinency            PT     praxis deficits, LLE flexor withdraw              OT                  SLP                SW                Discharge Planning/Teaching Needs:  Plan to d/c home with family providing 24/7 assistance.  Teaching to be done prior to d/c.   Team Discussion:  Significant spasticity left side.  Cannot straighten left leg - may require botox but will trial meds first.  Retaining urine and needs caths.  Poor initiation, poor left side attention and  very apraxic.  Can current range mod assist to +2 max with PT and OT.  Goals mod assist with amb and min assist overall w/c and ADLs. Concerned about stairs as home with 2 levels.    Revisions to Treatment Plan:  None    Continued Need for Acute Rehabilitation Level of Care: The patient requires daily medical management by a physician with specialized training in physical medicine and rehabilitation for the following conditions: Daily direction of a multidisciplinary physical rehabilitation program to ensure safe treatment while eliciting the highest outcome that is of practical value to the patient.: Yes Daily medical management of patient stability for increased activity during participation in an intensive rehabilitation regime.: Yes Daily analysis of laboratory values and/or radiology reports with any subsequent need for medication adjustment of medical intervention for : Neurological problems;Diabetes problems;Mood/behavior problems  [name] 02/17/2017, 12:00 PM

## 2017-02-17 NOTE — Plan of Care (Signed)
Problem: RH SAFETY Goal: RH STG ADHERE TO SAFETY PRECAUTIONS W/ASSISTANCE/DEVICE STG Adhere to Safety Precautions With moderate assistance appropriate Assistance Device.   Outcome: Progressing No safety issues noted this shift, safety precautions maintained  Problem: RH PAIN MANAGEMENT Goal: RH STG PAIN MANAGED AT OR BELOW PT'S PAIN GOAL Patient's pain level managed at a tolerable level.  Outcome: Progressing Denies pain and discomfort this shift

## 2017-02-18 ENCOUNTER — Inpatient Hospital Stay (HOSPITAL_COMMUNITY): Payer: 59 | Admitting: Occupational Therapy

## 2017-02-18 ENCOUNTER — Inpatient Hospital Stay (HOSPITAL_COMMUNITY): Payer: 59 | Admitting: Speech Pathology

## 2017-02-18 ENCOUNTER — Inpatient Hospital Stay (HOSPITAL_COMMUNITY): Payer: 59 | Admitting: Physical Therapy

## 2017-02-18 LAB — GLUCOSE, CAPILLARY
GLUCOSE-CAPILLARY: 149 mg/dL — AB (ref 65–99)
GLUCOSE-CAPILLARY: 166 mg/dL — AB (ref 65–99)
Glucose-Capillary: 132 mg/dL — ABNORMAL HIGH (ref 65–99)
Glucose-Capillary: 267 mg/dL — ABNORMAL HIGH (ref 65–99)

## 2017-02-18 MED ORDER — BETHANECHOL CHLORIDE 25 MG PO TABS
25.0000 mg | ORAL_TABLET | Freq: Three times a day (TID) | ORAL | Status: DC
  Administered 2017-02-18 (×3): 25 mg via ORAL
  Filled 2017-02-18 (×4): qty 1

## 2017-02-18 MED ORDER — TERAZOSIN HCL 2 MG PO CAPS
2.0000 mg | ORAL_CAPSULE | Freq: Every day | ORAL | Status: DC
  Administered 2017-02-18 – 2017-02-20 (×3): 2 mg via ORAL
  Filled 2017-02-18 (×3): qty 1

## 2017-02-18 MED ORDER — LIVING WELL WITH DIABETES BOOK
Freq: Once | Status: AC
  Administered 2017-02-18: 12:00:00
  Filled 2017-02-18: qty 1

## 2017-02-18 MED ORDER — ENSURE ENLIVE PO LIQD
237.0000 mL | Freq: Two times a day (BID) | ORAL | Status: DC
  Administered 2017-02-18 – 2017-03-04 (×24): 237 mL via ORAL

## 2017-02-18 NOTE — Progress Notes (Signed)
Social Work Patient ID: Frank Moss, male   DOB: [birth year], 46 y.o.   MRN: [id number]   Have reviewed team conference with pt, wife and daughter.  All aware of targeted LOS of 4 weeks and agreeable.  Have stressed to all that goals are for min assist and he will definitely need 24/7 coverage.  Wife with questions about how LOS is determined.  I answered these questions and reminded her that 24/7 care was anticipated upon admission to CIR.  Daughter then reports "We'll do it. Between all of us we'll do it."  Feel that a family conference would be beneficial for all.  Wife also requesting home eval and did not seem satisfied with my explanation that she should talk through layout of the home with therapies and complete home measurement sheet as this may be sufficient and no eval needed.  Will talk with team to schedule family conference.  Continue to follow.  Doyne Micke, LCSW

## 2017-02-18 NOTE — Progress Notes (Signed)
Speech Language Pathology Weekly Progress and Session Note  Patient Details  Name: Frank Moss MRN: [id number] Date of Birth: [birth year]  Beginning of progress report period: February 11, 2017 End of progress report period: February 18, 2017  Today's Date: 02/18/2017 SLP Individual Time: 1100-1130 SLP Individual Time Calculation (min): 30 min  Short Term Goals: Week 1: SLP Short Term Goal 1 (Week 1): Pt will initiate and complete tasks in a timely manner during basic functional tasks with mod assist verbal cues.   SLP Short Term Goal 1 - Progress (Week 1): Met SLP Short Term Goal 2 (Week 1): Pt will complete basic, familiar tasks with mod assist verbal cues for functional problem solving.   SLP Short Term Goal 2 - Progress (Week 1): Met SLP Short Term Goal 3 (Week 1): Pt will utilize external aids to recall basic, daily information with mod assist verbal cues.   SLP Short Term Goal 3 - Progress (Week 1): Not met SLP Short Term Goal 4 (Week 1): Pt will sustain his attention to basic, familiar tasks for 5-7 minutes with mod assist verbal cues for redirection.   SLP Short Term Goal 4 - Progress (Week 1): Not met    New Short Term Goals: Week 2: SLP Short Term Goal 1 (Week 2): Pt will sustain his attention to basic, familiar tasks for 5-7 minutes with mod assist verbal cues for redirection.   SLP Short Term Goal 2 (Week 2): Pt will utilize external aids to recall basic, daily information with mod assist verbal cues.   SLP Short Term Goal 3 (Week 2): Pt will complete basic, familiar tasks with mod assist verbal cues for functional problem solving.   SLP Short Term Goal 4 (Week 2): Pt will attend/scan to left field of enviornment/body during functional tasks with Mod A verbal cues.   Weekly Progress Updates: Patient has made functional gains and has met 2 of 4 STG's this reporting period. Currently, patient requires overall Max A verbal cues for recall and sustained attention and Mod A verbal cues  for problem solving and attention to left field of environment in order to complete functional and familiar tasks safely. Patient appears more alert and engaged and demonstrates increased ability to initiate functional tasks. Patient and family education is ongoing. Patient would benefit from continued skilled SLP intervention to maximize his cognitive function and overall functional independence prior to discharge.      Intensity: Minumum of 1-2 x/day, 30 to 90 minutes Frequency: 3 to 5 out of 7 days Duration/Length of Stay: 21 days  Treatment/Interventions: Cognitive remediation/compensation;Cueing hierarchy;Environmental controls;Functional tasks;Internal/external aids;Patient/family education;Therapeutic Activities   Daily Session  Skilled Therapeutic Interventions: Skilled treatment session focused on cognitive goals. Patient required Max A verbal cues for recall of his current medications and their functions and Min A verbal cues for problem solving during a mildly complex medication management task of organizing a BID pill box. Task will need to be completed at next session. Patient left upright in wheelchair with all needs within reach. Continue with current plan of care.        Function:  Cognition Comprehension Comprehension assist level: Understands basic 90% of the time/cues < 10% of the time  Expression   Expression assist level: Expresses basic 50 - 74% of the time/requires cueing 25 - 49% of the time. Needs to repeat parts of sentences.  Social Interaction Social Interaction assist level: Interacts appropriately 50 - 74% of the time - May be physically or verbally inappropriate.  Problem Solving Problem solving assist level: Solves basic 25 - 49% of the time - needs direction more than half the time to initiate, plan or complete simple activities  Memory Memory assist level: Recognizes or recalls 50 - 74% of the time/requires cueing 25 - 49% of the time   Pain Pain  Assessment Pain Assessment: No/denies pain  Therapy/Group: Individual Therapy  Amarria Andreasen 02/18/2017, 3:36 PM

## 2017-02-18 NOTE — Progress Notes (Signed)
Physical Therapy Weekly Progress Note  Patient Details  Name: Frank Moss MRN: [id number] Date of Birth: 10/22/1970  Beginning of progress report period: February 12, 2017 End of progress report period: February 18, 2017  Today's Date: 02/18/2017 PT Individual Time: 1400-1500 PT Individual Time Calculation (min): 60 min   Patient has met 3 of 4 short term goals.  Pt has made steady progress with therapy this week and his main challenges continue to be LLE tone/pain, and severe cognitive deficits particularly within the area of attention and awareness.  Pt has demonstrated improved LLE flexibility and is able to bear weight on LLE in standing but requires manual facilitation for foot flat and full knee extension.   Patient continues to demonstrate the following deficits muscle weakness, impaired timing and sequencing, abnormal tone, unbalanced muscle activation, motor apraxia, decreased coordination and decreased motor planning, decreased visual perceptual skills and decreased visual motor skills, decreased midline orientation and decreased attention to left, decreased initiation, decreased attention, decreased awareness, decreased problem solving, decreased safety awareness, decreased memory and delayed processing and decreased sitting balance, decreased standing balance, decreased postural control, hemiplegia and decreased balance strategies and therefore will continue to benefit from skilled PT intervention to increase functional independence with mobility.  Patient progressing toward long term goals..  Continue plan of care.  PT Short Term Goals Week 1:  PT Short Term Goal 1 (Week 1): Pt will perform bed mobility with max assist of 1 consistently  PT Short Term Goal 1 - Progress (Week 1): Met PT Short Term Goal 2 (Week 1): Pt Will perform squat pivot transfer with total assist of 1 consistently  PT Short Term Goal 2 - Progress (Week 1): Met PT Short Term Goal 3 (Week 1): Pt will initate WC  mobility  PT Short Term Goal 3 - Progress (Week 1): Not met PT Short Term Goal 4 (Week 1): Pt will perform sit<>stand with max assist of one consistently  PT Short Term Goal 4 - Progress (Week 1): Met Week 2:  PT Short Term Goal 1 (Week 2): Pt will ambulate 61' with +1 assist PT Short Term Goal 2 (Week 2): Pt will negotiate 4 steps with +1 assist PT Short Term Goal 3 (Week 2): Pt will tolerate sitting in standard w/c with supervision for safety PT Short Term Goal 4 (Week 2): Pt will initiate w/c mobility training   Skilled Therapeutic Interventions/Progress Updates:    no c/o pain at rest.  Session focus on LLE NMR, gait training, stair negotiation, and family education.    Pt transitions sit<>stand x2 in // bars, x1 with rail in hallway, and x1 at stairs with min guard>supervision.  Standing tolerance x2 trials to fatigue focus on L weight shift, LLE weight bearing, L knee extension, and posture.  Hamstring stretch from seated position 2x60 seconds for tone management.  Gait in // bars attempting to incorporate BUE support with max assist +2 for two steps with pt demonstrating decreased attention due to multiple degrees of freedom.  Repositioned at rail in hallway and pt ambulates x30' with mod assist +1.  Pt able to initiate LLE advance but unable to take sufficient step length so PT completed placement before pt stabilized.  Gait continues to improve daily.  Stair negotiation x2 steps with +2 assist for safety with pt showing deficits more in motor planning and attention than strength.  Pt's wife present for end of session and with many questions regarding pt's expected progression, DME, and d/c planning.  PT provided education and emotional support within scope.  Pt returned to room at end of session and positioned with QRB in place, call bell in reach and needs met.   Therapy Documentation Precautions:  Precautions Precautions: Fall Precaution Comments: pusher to the left, apraxia, decreased  initiation, left hemiparesis Restrictions Weight Bearing Restrictions: No   See Function Navigator for Current Functional Status.  Therapy/Group: Individual Therapy  Earnest Conroy Penven-Crew 02/18/2017, 4:44 PM

## 2017-02-18 NOTE — Progress Notes (Signed)
Occupational Therapy Weekly Progress Note  Patient Details  Name: Frank Moss MRN: [id number] Date of Birth: 03/06/1971  Beginning of progress report period: February 12, 2017 End of progress report period: February 18, 2017  Today's Date: 02/18/2017 OT Individual Time: 0800-0900 OT Individual Time Calculation (min): 60 min    Patient has met 3 of 5 short term goals. Pt is making steady progress with OT treatments at this time.  He is able to maintain sitting balance at the sink and at EOB with intermittent supervision and can complete a stand-pivot transfer to the right side with Mod-Max A. Pt continues to need increased time and cues to initiate transfers, but is improving. LUE function continues to improve with guided assist to integrate into BADL tasks 2/2 apraxia and L side attention deficits.  Will continue with current OT treatment POC.     Patient continues to demonstrate the following deficits: muscle weakness and muscle joint tightness, impaired timing and sequencing, unbalanced muscle activation, motor apraxia, decreased coordination and decreased motor planning, decreased midline orientation, decreased attention to left and decreased motor planning, decreased initiation, decreased attention, decreased awareness, decreased problem solving, decreased safety awareness, decreased memory and delayed processing and decreased sitting balance, decreased standing balance, decreased postural control, hemiplegia and decreased balance strategies and therefore will continue to benefit from skilled OT intervention to enhance overall performance with BADL and Reduce care partner burden.  Patient progressing toward long term goals..  Continue plan of care.  OT Short Term Goals Week 1:  OT Short Term Goal 1 (Week 1): Pt will maintain static sitting balance EOB with supervision for 3 mins in preparation for transfers or selfcare.  OT Short Term Goal 1 - Progress (Week 1): Met OT Short Term Goal 2 (Week 1):  Pt will maintain dynamic sitting balance unsupported EOB or EOC with min assist during bathing and dressing tasks.  OT Short Term Goal 2 - Progress (Week 1): Met OT Short Term Goal 3 (Week 1): Pt will complete UB dressing with min assist to donn pullover shirt.  OT Short Term Goal 3 - Progress (Week 1): Progressing toward goal OT Short Term Goal 4 (Week 1): Pt will donn pull up pants with mod assist sit to stand.  OT Short Term Goal 4 - Progress (Week 1): Progressing toward goal OT Short Term Goal 5 (Week 1): Pt will use the LUE as an active assist for bathing tasks with no more than mod assist.  OT Short Term Goal 5 - Progress (Week 1): Met Week 2:  OT Short Term Goal 1 (Week 2): Pt will complete UB dressing with min assist to donn pullover shirt.  OT Short Term Goal 2 (Week 2): Pt will donn pull up pants with mod assist sit to stand.  OT Short Term Goal 3 (Week 2): Pt will initiate use of L UE during bathing tasks with min questioning cue 75% of tasks OT Short Term Goal 4 (Week 2): Pt will locate 2/4 grooming items on L side of sink with min cues  Skilled Therapeutic Interventions/Progress Updates:   Bed mobility completed with max A, multimodal cues, and hand over hand A to bring L UE to reach for railing. Pt with improved LLE flexor tone and tolerated therpist assist to bring LLE to EOB without pain. Pt completed squat-pivot transfer to R with increased time, max multimodal cues to initiate and facilitation to elicit pivot once pt powered up. Bathing/dressing completed at the sink with focus on L attention,  functional use of L UE, sequencing, and initiation. Guided A to integrate L UE into bathing tasks 90% of the time. Sit<>stand at the sink with Max A and multimodal cues+ increased time to initiate. Pt stood for ~1 minute while utilizing B UE's to wash peri-area and buttocks. Facilitated LLE weight bearing throughout stand with Max A overall to maintain balance 2/2 lean to L. Pt able to verbalize  hemi dressing techniques, but required Max A to follow through. Pt initiated second sit<>stand to pull pants up quicker 2/2 familiar ADL task. Pt left seated in wc at end of session with wc tilted, safety belt on, and LE's propped onto bed 2/2 pt tendency to scoot down in TIS wc and push onto leg rests-breaking leg rests.  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   Therapy/Group: Individual Therapy  Valma Cava 02/18/2017, 8:31 AM

## 2017-02-18 NOTE — Progress Notes (Signed)
[place] PHYSICAL MEDICINE & REHABILITATION     PROGRESS NOTE  Subjective/Complaints:  Per PT was able to straighten LLE during standing yesterday.  Denies LLE pain when trying to sleep despite L knee in contant flexed position   ROS: Denies CP, SOB, nausea, vomiting, diarrhea.  Objective: Vital Signs: Blood pressure (!) 168/82, pulse 87, temperature 98.7 F (37.1 C), temperature source Oral, resp. rate 18, height 6\' 2"  (1.88 m), weight 103 kg (227 lb 0.7 oz), SpO2 97 %. No results found.  Recent Labs  02/16/17 0510  WBC 5.3  HGB 11.7*  HCT 34.9*  PLT 264    Recent Labs  02/16/17 0510  NA 134*  K 4.1  CL 98*  GLUCOSE 139*  BUN 21*  CREATININE 1.28*  CALCIUM 8.7*   CBG (last 3)   Recent Labs  02/17/17 1122 02/17/17 1629 02/17/17 2121  GLUCAP 142* 230* 121*    Wt Readings from Last 3 Encounters:  02/16/17 103 kg (227 lb 0.7 oz)  02/08/17 103.7 kg (228 lb 11.2 oz)  02/08/17 124.3 kg (274 lb)    Physical Exam:  BP (!) 168/82 (BP Location: Right Arm)   Pulse 87   Temp 98.7 F (37.1 C) (Oral)   Resp 18   Ht 6\' 2"  (1.88 m)   Wt 103 kg (227 lb 0.7 oz)   SpO2 97%   BMI 29.15 kg/m  Constitutional: He appears well-developedand well-nourished.  HENT: Normocephalicand atraumatic.  Eyes: EOMI. No discharge. Cardiovascular: RRR. No JVD. Respiratory: Effort normal breath sounds normal.  GI: Soft. Bowel sounds are normal.   Neurological: He is alertand oriented.  Left facial weakness with left gaze preference and right neglect, improving.  Delayed processing with slow speech. His attention is poor and he lacks awareness/insight into deficits. Spastic left hemiparesis  Motor: Left lower extremity: 2+/5 proximal to distal Skin: Skin is warmand dry.  Psychiatric: His affect is blunt. His speech is delayed. He is slowed. He expresses inappropriate judgment. He is inattentive.    Assessment/Plan: 1. 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 Physiatrist is providing close team supervision and 24 hour management of active medical problems listed below. Physiatrist and rehab team continue to assess barriers to discharge/monitor patient progress toward functional and medical goals.  Function:  Bathing Bathing position   Position:  (Stedy )  Bathing parts Body parts bathed by patient: Left arm, Chest, Abdomen, Front perineal area, Right upper leg, Left upper leg Body parts bathed by helper: Right lower leg, Left lower leg, Back, Buttocks, Right arm  Bathing assist Assist Level: Touching or steadying assistance(Pt > 75%)      Upper Body Dressing/Undressing Upper body dressing   What is the patient wearing?: Pull over shirt/dress     Pull over shirt/dress - Perfomed by patient: Put head through opening, Thread/unthread right sleeve Pull over shirt/dress - Perfomed by helper: Pull shirt over trunk, Thread/unthread left sleeve        Upper body assist Assist Level: Touching or steadying assistance(Pt > 75%)      Lower Body Dressing/Undressing Lower body dressing   What is the patient wearing?: Pants, Underwear, Non-skid slipper socks   Underwear - Performed by helper: Thread/unthread right underwear leg, Thread/unthread left underwear leg, Pull underwear up/down Pants- Performed by patient: Thread/unthread right pants leg Pants- Performed by helper: Thread/unthread right pants leg, Thread/unthread left pants  leg, Pull pants up/down   Non-skid slipper socks- Performed by helper: Don/doff right sock, Don/doff left sock       Shoes - Performed by helper: Don/doff right shoe, Don/doff left shoe, Fasten right, Fasten left          Lower body assist Assist for lower body dressing:  (TotalA)      Toileting Toileting     Toileting steps completed by helper: Adjust clothing prior to  toileting, Adjust clothing after toileting, Performs perineal hygiene    Toileting assist Assist level: Touching or steadying assistance (Pt.75%)   Transfers Chair/bed transfer   Chair/bed transfer method: Squat pivot Chair/bed transfer assist level: Maximal assist (Pt 25 - 49%/lift and lower) Chair/bed transfer assistive device: Armrests Mechanical lift: Stedy   Locomotion Ambulation Ambulation activity did not occur: Safety/medical concerns   Max distance: 60ft Assist level: Maximal assist (Pt 25 - 49%)   Wheelchair Wheelchair activity did not occur: Safety/medical concerns Type: Manual      Cognition Comprehension Comprehension assist level: Understands basic 90% of the time/cues < 10% of the time  Expression Expression assist level: Expresses basic 50 - 74% of the time/requires cueing 25 - 49% of the time. Needs to repeat parts of sentences.  Social Interaction Social Interaction assist level: Interacts appropriately 50 - 74% of the time - May be physically or verbally inappropriate.  Problem Solving Problem solving assist level: Solves basic 25 - 49% of the time - needs direction more than half the time to initiate, plan or complete simple activities  Memory Memory assist level: Recognizes or recalls 50 - 74% of the time/requires cueing 25 - 49% of the time    Medical Problem List and Plan: 1.  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  DVT Prophylaxis/Anticoagulation: Pharmaceutical: Lovenox 3. H/o lumbago/Pain Management: Denies pain at this moment. Tylenol prn 4. Mood: LCSW to follow for evaluation and support.  5. Neuropsych: This patient is not capable of making decisions on his  own behalf. 6. Skin/Wound Care: routine pressure relief measures 7. Fluids/Electrolytes/Nutrition: Monitor I/Os--offer supplements if intake poor.   8.T2DM: monitor BS ac/hs.  Continue lantus daily with meal coverage. Will use SSI for elevated BS.    CBG (last 3) not being monitored will check ac TID, Increase lantus to 25U  Recent Labs  02/17/17 1122 02/17/17 1629 02/17/17 2121  GLUCAP 142* 230* 121*    9. HTN:  Monitor BP bid  Norvasc 5mg  daily, increased to 7.5 on 7/28, will increase to 10mg  7/30- monitor for effect, systolic elevated change flomax to Hytrin  Vitals:   02/17/17 1338 02/18/17 0500  BP: (!) 154/71 (!) 168/82  Pulse: 91 87  Resp: 18 18  Temp: 99.1 F (37.3 C) 98.7 F (37.1 C)   10 H/o urinary retention: currently incontinent. Toilet patient every 4 hours and cath if retention noted.   11. Hyperlipidemia: On atorvastatin.  12. Question CKD: baseline SCr around 1.6 per records review. Continue to monitor.  Offer fluids between meals for adequate hydration.   Cr. 1.30 on 7/28  Cont to monitor 13. Hyponatremia  Sodium 134 on 7/28  Continue to monitor 14. Hypoalbuminemia  Supplement initiated 7/28 15. Acute blood loss anemia  Hemoglobin 12.4 on 7/28  Continue to monitor 16.  Spasticity- Left side , L hanstring most affected has daytime spasticity , Increase zanaflex to  2mg  BID and 4mg  Qhs, pt refusing botox 17.  Urinary incont- occ retention now on flomax , voids during the day,  Urecholine 10mg  TID, increase to 25mg  increased odor will check UA given freq caths LOS (Days) 7 A FACE TO FACE EVALUATION WAS PERFORMED  Erick ColaceKIRSTEINS,Javeria Briski E 02/18/2017 6:53 AM

## 2017-02-18 NOTE — Progress Notes (Signed)
Nutrition Education Note   RD consulted for nutrition education regarding diabetes.   Lab Results  Component Value Date   HGBA1C 11.2 01/21/2017    RD provided "Nutrition and Type II Diabetes" handout from the Academy of [employer]. Discussed different food groups and their effects on blood sugar, emphasizing carbohydrate-containing foods. Provided list of carbohydrates and recommended serving sizes of common foods.  Discussed importance of controlled and consistent carbohydrate intake throughout the day. Provided examples of ways to balance meals/snacks and encouraged intake of high-fiber, whole grain complex carbohydrates. Teach back method used.  Expect fair compliance.  Body mass index is 29.15 kg/m. Pt meets criteria for overweight based on current BMI.  Current diet order is HH/CHO controlled, patient is consuming approximately 100% of meals at this time. Labs and medications reviewed. No further nutrition interventions warranted at this time. RD contact information provided. If additional nutrition issues arise, please re-consult RD.  Betsey Holidayasey Quantavis Obryant MS, RD, LDN Pager #- [phone] After Hours Pager: [phone]

## 2017-02-18 NOTE — Progress Notes (Signed)
Occupational Therapy Session Note  Patient Details  Name: Frank Moss MRN: [id number] Date of Birth: 05-Dec-1970  Today's Date: 02/18/2017 OT Individual Time: 1000-1100 OT Individual Time Calculation (min): 60 min    Short Term Goals: Week 1:  OT Short Term Goal 1 (Week 1): Pt will maintain static sitting balance EOB with supervision for 3 mins in preparation for transfers or selfcare.  OT Short Term Goal 2 (Week 1): Pt will maintain dynamic sitting balance unsupported EOB or EOC with min assist during bathing and dressing tasks.  OT Short Term Goal 3 (Week 1): Pt will complete UB dressing with min assist to donn pullover shirt.  OT Short Term Goal 4 (Week 1): Pt will donn pull up pants with mod assist sit to stand.  OT Short Term Goal 5 (Week 1): Pt will use the LUE as an active assist for bathing tasks with no more than mod assist.       Skilled Therapeutic Interventions/Progress Updates:    Pt seen this session to facilitate trunk control, LUE coordination, and attention. Pt received in tilt in space w/c with his feet up on the bed. Pt was pushing against the bed tipping the wc back.  The heaviness of the wc and the anti-tippers prevented the w/c from tilting too far. Pt taken to gym and from w/c level with feet on the floor worked on upright postural control with controlled wt shifts laterally.  PNF D1, D2 UE exercises with hands clasped with trunk rotation to reduce tone and challenge trunk strength. Pt then transferred to mat squat pivot with max A (2nd person present for safety).  From mat, pt held sitting balance with occasional posterior lean but corrected with cues.  Worked on trunk elongation to decrease kyphotic posture.  Continued hand clasped exercises with forward reaching to touch a mirror place in front of him and then holding light weight ball.  Pt had difficulty with coordination of B hands with maintaining grasp on ball. Also worked on increasing speed of movement as pt's  movement patterns are very slow. Pt's main obstacle this session was decreased attention. He would become distracted after just 10 seconds looking around the gym and watching other patients.  He needed constant cues to refocus on tasks/ exercises.  Pt may benefit from working in quieter gym.  Overall, he did participate well. Pt taken back to room with chair tilted back and B leg rests on.   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   Therapy/Group: Individual Therapy  Norine Reddington 02/18/2017, 12:04 PM

## 2017-02-19 ENCOUNTER — Inpatient Hospital Stay (HOSPITAL_COMMUNITY): Payer: 59 | Admitting: Occupational Therapy

## 2017-02-19 LAB — URINALYSIS, ROUTINE W REFLEX MICROSCOPIC
BILIRUBIN URINE: NEGATIVE
GLUCOSE, UA: NEGATIVE mg/dL
Hgb urine dipstick: NEGATIVE
KETONES UR: NEGATIVE mg/dL
Leukocytes, UA: NEGATIVE
Nitrite: NEGATIVE
PROTEIN: 100 mg/dL — AB
Specific Gravity, Urine: 1.019 (ref 1.005–1.030)
Squamous Epithelial / LPF: NONE SEEN
pH: 5 (ref 5.0–8.0)

## 2017-02-19 LAB — GLUCOSE, CAPILLARY
GLUCOSE-CAPILLARY: 219 mg/dL — AB (ref 65–99)
Glucose-Capillary: 155 mg/dL — ABNORMAL HIGH (ref 65–99)
Glucose-Capillary: 118 mg/dL — ABNORMAL HIGH (ref 65–99)
Glucose-Capillary: 183 mg/dL — ABNORMAL HIGH (ref 65–99)

## 2017-02-19 MED ORDER — INSULIN GLARGINE 100 UNIT/ML ~~LOC~~ SOLN
30.0000 [IU] | Freq: Every day | SUBCUTANEOUS | Status: DC
  Administered 2017-02-19 – 2017-02-21 (×3): 30 [IU] via SUBCUTANEOUS
  Filled 2017-02-19 (×3): qty 0.3

## 2017-02-19 MED ORDER — BETHANECHOL CHLORIDE 25 MG PO TABS
25.0000 mg | ORAL_TABLET | Freq: Four times a day (QID) | ORAL | Status: DC
  Administered 2017-02-19 – 2017-03-01 (×41): 25 mg via ORAL
  Filled 2017-02-19 (×43): qty 1

## 2017-02-19 NOTE — Progress Notes (Signed)
[place] PHYSICAL MEDICINE & REHABILITATION     PROGRESS NOTE  Subjective/Complaints:  Per PT still not at foot flat but some improvement   ROS: Denies CP, SOB, nausea, vomiting, diarrhea.  Objective: Vital Signs: Blood pressure 132/72, pulse 94, temperature 98.1 F (36.7 C), temperature source Oral, resp. rate 18, height 6\' 2"  (1.88 m), weight 103 kg (227 lb 0.7 oz), SpO2 100 %. No results found. No results for input(s): WBC, HGB, HCT, PLT in the last 72 hours. No results for input(s): NA, K, CL, GLUCOSE, BUN, CREATININE, CALCIUM in the last 72 hours.  Invalid input(s): CO CBG (last 3)   Recent Labs  02/18/17 1643 02/18/17 2118 02/19/17 0632  GLUCAP 267* 166* 155*    Wt Readings from Last 3 Encounters:  02/16/17 103 kg (227 lb 0.7 oz)  02/08/17 103.7 kg (228 lb 11.2 oz)  02/08/17 124.3 kg (274 lb)    Physical Exam:  BP 132/72 (BP Location: Right Arm)   Pulse 94   Temp 98.1 F (36.7 C) (Oral)   Resp 18   Ht 6\' 2"  (1.88 m)   Wt 103 kg (227 lb 0.7 oz)   SpO2 100%   BMI 29.15 kg/m  Constitutional: He appears well-developedand well-nourished.  HENT: Normocephalicand atraumatic.  Eyes: EOMI. No discharge. Cardiovascular: RRR. No JVD. Respiratory: Effort normal breath sounds normal.  GI: Soft. Bowel sounds are normal.   Neurological: He is alertand oriented.  Left facial weakness with left gaze preference and right neglect, improving.  Delayed processing with slow speech. His attention is poor and he lacks awareness/insight into deficits. Spastic left hemiparesis  Motor: Left lower extremity: 2+/5 proximal to distal Skin: Skin is warmand dry.  Psychiatric: His affect is blunt. His speech is delayed. He is slowed. He expresses inappropriate judgment. He is inattentive.    Assessment/Plan: 1. 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 Physiatrist is providing close team supervision and 24 hour management of active medical problems listed below. Physiatrist and rehab team continue to assess barriers to discharge/monitor patient progress toward functional and medical goals.  Function:  Bathing Bathing position   Position:  (Stedy )  Bathing parts Body parts bathed by patient: Left arm, Chest, Abdomen, Front perineal area, Right upper leg, Left upper leg Body parts bathed by helper: Right lower leg, Left lower leg, Back, Buttocks, Right arm  Bathing assist Assist Level: Touching or steadying assistance(Pt > 75%)      Upper Body Dressing/Undressing Upper body dressing   What is the patient wearing?: Pull over shirt/dress     Pull over shirt/dress - Perfomed by patient: Put head through opening, Thread/unthread right sleeve Pull over shirt/dress - Perfomed by helper: Pull shirt over trunk, Thread/unthread left sleeve        Upper body assist Assist Level: Touching or steadying assistance(Pt > 75%)      Lower Body Dressing/Undressing Lower body dressing   What is the patient wearing?: Pants, Non-skid slipper socks   Underwear - Performed by helper: Thread/unthread right underwear leg, Thread/unthread left underwear leg, Pull underwear up/down Pants- Performed by patient: Thread/unthread right pants leg Pants- Performed by helper: Thread/unthread left pants leg, Pull pants up/down   Non-skid slipper socks- Performed by helper: Don/doff right sock, Don/doff left sock       Shoes - Performed by helper: Don/doff right shoe, Don/doff left shoe, Fasten right, [employer]  left          Lower body assist Assist for lower body dressing: Touching or steadying assistance (Pt > 75%)      Toileting Toileting     Toileting steps completed by helper: Adjust clothing prior to toileting, Adjust clothing after toileting, Performs perineal hygiene    Toileting assist Assist level: Touching or  steadying assistance (Pt.75%)   Transfers Chair/bed transfer   Chair/bed transfer method: Squat pivot Chair/bed transfer assist level: Maximal assist (Pt 25 - 49%/lift and lower) (2nd person present for safety) Chair/bed transfer assistive device: Armrests Mechanical lift: Stedy   Locomotion Ambulation Ambulation activity did not occur: Safety/medical concerns   Max distance: 3530ft Assist level: Moderate assist (Pt 50 - 74%)   Wheelchair Wheelchair activity did not occur: Safety/medical concerns Type: Manual      Cognition Comprehension Comprehension assist level: Understands basic 90% of the time/cues < 10% of the time  Expression Expression assist level: Expresses basic 50 - 74% of the time/requires cueing 25 - 49% of the time. Needs to repeat parts of sentences.  Social Interaction Social Interaction assist level: Interacts appropriately 50 - 74% of the time - May be physically or verbally inappropriate.  Problem Solving Problem solving assist level: Solves basic 25 - 49% of the time - needs direction more than half the time to initiate, plan or complete simple activities  Memory Memory assist level: Recognizes or recalls 50 - 74% of the time/requires cueing 25 - 49% of the time    Medical Problem List and Plan: 1.  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  DVT Prophylaxis/Anticoagulation: Pharmaceutical: Lovenox 3. H/o lumbago/Pain Management: Denies pain at this moment. Tylenol prn 4. Mood: LCSW to follow for evaluation and support.  5. Neuropsych: This patient is not capable of making decisions on his  own behalf. 6. Skin/Wound Care: routine pressure relief measures 7. Fluids/Electrolytes/Nutrition: Monitor I/Os--offer supplements if intake poor.   8.T2DM: monitor BS ac/hs. Continue lantus daily with meal coverage. Will use SSI for elevated BS.    CBG (last 3)  not being monitored will check ac TID, Increase lantus to 30U  Recent Labs  02/18/17 1643 02/18/17 2118 02/19/17 0632  GLUCAP 267* 166* 155*    9. HTN:  Monitor BP bid  Norvasc 5mg  daily, increased to 7.5 on 7/28, will increase to 10mg  7/30- monitor for effect, systolic elevated change flomax to Hytrin 8/3 systolic improving  Vitals:   16/04/9607/03/18 1326 02/19/17 0531  BP: (!) 162/82 132/72  Pulse: 91 94  Resp: 18 18  Temp: 98.8 F (37.1 C) 98.1 F (36.7 C)   10 H/o urinary retention: currently incontinent. Toilet patient every 4 hours and cath if retention noted.   11. Hyperlipidemia: On atorvastatin.  12. Question CKD: baseline SCr around 1.6 per records review. Continue to monitor.  Offer fluids between meals for adequate hydration.   Cr. 1.30 on 7/28  Cont to monitor 13. Hyponatremia  Sodium 134 on 7/28  Continue to monitor 14. Hypoalbuminemia  Supplement initiated 7/28 15. Acute blood loss anemia  Hemoglobin 12.4 on 7/28  Continue to monitor 16.  Spasticity- Left side , L hanstring most affected has daytime spasticity , Increase zanaflex to 2mg  BID and 4mg  Qhs, pt refusing botox 17.  Urinary incont- occ retention now on flomax , voids during the day,  Urecholine 10mg  TID,  increase to 25mg QID  Await UA LOS (Days) 8 A FACE TO FACE EVALUATION WAS PERFORMED  Frank Moss,Frank Moss 02/19/2017 6:56 AM

## 2017-02-19 NOTE — Progress Notes (Signed)
Occupational Therapy Session Note  Patient Details  Name: Frank Moss MRN: [id number] Date of Birth: [birth year]  Today's Date: 02/19/2017 OT Individual Time: 1302-1400 OT Individual Time Calculation (min): 58 min   Skilled Therapeutic Interventions/Progress Updates:    Tx focus on Lt NMR, visual field scanning, standing balance, and cognitive remediation during self care tasks.   Pt greeted supine in bed after lunch, agreeable to tx. He completed BADLs EOB. Pt initiating washing 2 body areas without cues and extra time for motor planning/self organizing. He was able to sequence 75% of self care with max questioning cues. Left hand often exhibited alien hand tendencies, grasping brief fasteners or towels without purpose (and per pt, without intention). Max multimodal cues and HOH assist for integrating Lt hand functionally during bilateral tasks (i.e wringing out wash cloths and donning socks). Mod A from additional helper necessary due to posterior LOBs without unilateral support. Mod A sit<stand in [place] for LB bathing/dressing with additional helper for safety with Lt pushing tendencies. He required cues and visual strategies to improve posture when standing in lift. Pt tending to gaze to the Lt side of room during 90% of tx. Required cues to "look" at Rt arm while using L UE to wash (with [facility] for [facility]ness). At end of tx [facility] helpers (due to Lt pushing). Pt reclined in TIS, safety belt donned, and all needs within reach at time of departure.   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   Therapy/Group: Individual Therapy  [name] A Sharece Fleischhacker 02/19/2017, 4:25 PM

## 2017-02-19 NOTE — Plan of Care (Signed)
Problem: RH BOWEL ELIMINATION Goal: RH STG MANAGE BOWEL WITH ASSISTANCE STG Manage Bowel with minimal [facility] to [facility] or bathroom.   Outcome: Not Progressing Lat BM incontinent-total assist  Problem: RH BLADDER ELIMINATION Goal: RH STG MANAGE BLADDER WITH ASSISTANCE STG Manage Bladder incontinence With minimal Assistance   Outcome: Not Progressing Requiring I/O cath q6H for high volumes

## 2017-02-20 ENCOUNTER — Inpatient Hospital Stay (HOSPITAL_COMMUNITY): Payer: 59

## 2017-02-20 DIAGNOSIS — G8110 Spastic hemiplegia affecting unspecified side: Secondary | ICD-10-CM

## 2017-02-20 LAB — GLUCOSE, CAPILLARY
GLUCOSE-CAPILLARY: 123 mg/dL — AB (ref 65–99)
GLUCOSE-CAPILLARY: 193 mg/dL — AB (ref 65–99)
Glucose-Capillary: 104 mg/dL — ABNORMAL HIGH (ref 65–99)
Glucose-Capillary: 172 mg/dL — ABNORMAL HIGH (ref 65–99)

## 2017-02-20 NOTE — Progress Notes (Signed)
Physical Therapy Session Note  Patient Details  Name: Frank Moss MRN: [id number] Date of Birth: Dec 27, 1970  Today's Date: 02/20/2017 PT Individual Time: 1530-1630 PT Individual Time Calculation (min): 60 min   Short Term Goals: Week 2:  PT Short Term Goal 1 (Week 2): Pt will ambulate 1450' with +1 assist PT Short Term Goal 2 (Week 2): Pt will negotiate 4 steps with +1 assist PT Short Term Goal 3 (Week 2): Pt will tolerate sitting in standard w/c with supervision for safety PT Short Term Goal 4 (Week 2): Pt will initiate w/c mobility training   Skilled Therapeutic Interventions/Progress Updates:    Pt seated in w/c upon PT arrival, agreeable to therapy tx. In parallel bars worked on sit to stands x 4 with facilitation through L LE for weightbearing, verbal cues for anterior weightshift, moderate assist for sit<>stand. In standing worked on weightshifting to L LE and stepping forward x 1 and backwards x 1 with R LE, max assist. In standing, worked on L lateral weightshift, hip and trunk extension and posture using the mirror for feedback. Pt c/o L LE pain in standing in hamstring area, PT performed manual stretching 4 x 1 min to hamstring and calf in between each standing bout. Pt ambulated x 5 ft in parallel bars with max assist to advance L LE and block L knee. Pt transferred from w/c to mat to the R, lateral scoot with mod assist and max verbal cues for sequencing. Worked on dynamic sitting balance and cognitive task to complete peg board picture, pt able to correct errors with moderate verbal cues. Pt transferred from mat back to w/c stand pivot with max assist, verbal cues for sequencing. Pt left reclined in w/c with wife present in room and call bell in reach.   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 Relieved with seated rest and stretching.   See Function Navigator for Current Functional Status.   Therapy/Group: Individual Therapy  Cresenciano GenreEmily van Schagen, PT, DPT 02/20/2017, 2:04 PM

## 2017-02-20 NOTE — Procedures (Addendum)
LOT# [id number]  Botox Injection for spasticity using needle EMG guidance  Dilution: 40 Units/ml Indication: Severe spasticity which interferes with ADL,mobility and/or  hygiene and is unresponsive to medication management and other conservative care Informed consent was obtained after describing risks and benefits of the procedure with the patient. This includes bleeding, bruising, infection, excessive weakness, or medication side effects Needle: 25G 1.5in Number of units per muscle  [id number]   All injections were done after obtaining appropriate EMG activity and after negative drawback for blood. The patient tolerated the procedure well. Post procedure instructions were given. A followup appointment was made.

## 2017-02-20 NOTE — Progress Notes (Signed)
[facility] PHYSICAL MEDICINE & REHABILITATION     PROGRESS NOTE  Subjective/Complaints:   Pt asking about Botox for LLE spasms  ROS: Denies CP, SOB, nausea, vomiting, diarrhea.  Objective: Vital Signs: Blood pressure (!) 144/68, pulse 88, temperature 98.1 F (36.7 C), temperature source Oral, resp. rate 16, height 6\' 2"  (1.88 m), weight 103 kg (227 lb 0.7 oz), SpO2 98 %. No results found. No results for input(s): WBC, HGB, HCT, PLT in the last 72 hours. No results for input(s): NA, K, CL, GLUCOSE, BUN, CREATININE, CALCIUM in the last 72 hours.  Invalid input(s): CO CBG (last 3)   Recent Labs  02/19/17 1701 02/19/17 2059 02/20/17 0627  GLUCAP 219* 183* 123*    Wt Readings from Last 3 Encounters:  02/16/17 103 kg (227 lb 0.7 oz)  02/08/17 103.7 kg (228 lb 11.2 oz)  02/08/17 124.3 kg (274 lb)    Physical Exam:  BP (!) 144/68 (BP Location: Right Arm)   Pulse 88   Temp 98.1 F (36.7 C) (Oral)   Resp 16   Ht 6\' 2"  (1.88 m)   Wt 103 kg (227 lb 0.7 oz)   SpO2 98%   BMI 29.15 kg/m  Constitutional: He appears well-developedand well-nourished.  HENT: Normocephalicand atraumatic.  Eyes: EOMI. No discharge. Cardiovascular: RRR. No JVD. Respiratory: Effort normal breath sounds normal.  GI: Soft. Bowel sounds are normal.   Neurological: He is alertand oriented.  Left facial weakness with left gaze preference and right neglect, improving.  Delayed processing with slow speech. His attention is poor and he lacks awareness/insight into deficits. Spastic left hemiparesis  Motor: Left lower extremity: 2+/5 proximal to distal Skin: Skin is warmand dry.  Psychiatric: His affect is blunt. His speech is delayed. He is slowed. He expresses inappropriate judgment. He is inattentive.    Assessment/Plan: 1. 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 Physiatrist is providing close team supervision and 24 hour management of active medical problems listed below. Physiatrist and rehab team continue to assess barriers to discharge/monitor patient progress toward functional and medical goals.  Function:  Bathing Bathing position   Position: Sitting EOB  Bathing parts Body parts bathed by patient: Left arm, Chest, Abdomen, Front perineal area, Right upper leg, Left upper leg Body parts bathed by helper: Right lower leg, Left lower leg, Back, Right arm  Bathing assist Assist Level: Touching or steadying assistance(Pt > 75%)      Upper Body Dressing/Undressing Upper body dressing   What is the patient wearing?: Pull over shirt/dress     Pull over shirt/dress - Perfomed by patient: Put head through opening, Thread/unthread right sleeve Pull over shirt/dress - Perfomed by helper: Pull shirt over trunk, Thread/unthread left sleeve        Upper body assist Assist Level: Touching or steadying assistance(Pt > 75%)      Lower Body Dressing/Undressing Lower body dressing   What is the patient wearing?: Pants, Non-skid slipper socks   Underwear - Performed by helper: Thread/unthread right underwear leg, Thread/unthread left underwear leg, Pull underwear up/down Pants- Performed by patient: Thread/unthread right pants leg Pants- Performed by helper: Thread/unthread right pants leg, Thread/unthread left pants leg, Pull pants up/down   Non-skid slipper socks- Performed by helper: Don/doff right sock, Don/doff left sock       Shoes - Performed by helper: Don/doff right shoe, Don/doff left shoe, Fasten right,  Fasten left          Lower body assist Assist for lower body dressing: Touching or steadying assistance (Pt > 75%)      Toileting Toileting     Toileting steps completed by helper: Adjust clothing prior to toileting, Performs perineal hygiene, Adjust clothing after toileting Toileting Assistive Devices: Toilet aid   Toileting assist Assist level: Two helpers   Transfers Chair/bed transfer   Chair/bed transfer method: Stand pivot Chair/bed transfer assist level: 2 helpers Chair/bed transfer assistive device: Mechanical lift Mechanical lift: Landscape architecttedy   Locomotion Ambulation Ambulation activity did not occur: Safety/medical concerns   Max distance: 6130ft Assist level: Moderate assist (Pt 50 - 74%)   Wheelchair Wheelchair activity did not occur: Safety/medical concerns Type: Manual      Cognition Comprehension Comprehension assist level: Understands basic 90% of the time/cues < 10% of the time  Expression Expression assist level: Expresses basic 90% of the time/requires cueing < 10% of the time.  Social Interaction Social Interaction assist level: Interacts appropriately 90% of the time - Needs monitoring or encouragement for participation or interaction.  Problem Solving Problem solving assist level: Solves basic 90% of the time/requires cueing < 10% of the time  Memory Memory assist level: Recognizes or recalls 90% of the time/requires cueing < 10% of the time    Medical Problem List and Plan: 1.  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  DVT Prophylaxis/Anticoagulation: Pharmaceutical: Lovenox 3. H/o lumbago/Pain Management: Denies pain at this moment. Tylenol prn 4. Mood: LCSW to follow for evaluation and support.  5. Neuropsych: This patient is not capable of making decisions on his  own behalf. 6. Skin/Wound Care: routine pressure relief measures 7. Fluids/Electrolytes/Nutrition: Monitor I/Os--offer supplements if intake poor.   8.T2DM: monitor BS ac/hs. Continue lantus daily with meal coverage. Will use SSI for elevated BS.    CBG (last 3) not being monitored will check ac TID, Increase lantus to 33U  Recent Labs  02/19/17 1701 02/19/17 2059 02/20/17 0627  GLUCAP 219*  183* 123*    9. HTN:  Monitor BP bid  Norvasc 5mg  daily, increased to 7.5 on 7/28, will increase to 10mg  7/30- monitor for effect, systolic elevated change flomax to Hytrin 8/3 systolic improving  Vitals:   13/02/6507/04/18 2048 02/20/17 0445  BP: (!) 159/83 (!) 144/68  Pulse: 91 88  Resp:  16  Temp:  98.1 F (36.7 C)   10 H/o urinary retention: currently incontinent. Toilet patient every 4 hours and cath if retention noted.   11. Hyperlipidemia: On atorvastatin.  12. Question CKD: baseline SCr around 1.6 per records review. Continue to monitor.  Offer fluids between meals for adequate hydration.   Cr. 1.30 on 7/28  Cont to monitor 13. Hyponatremia  Sodium 134 on 7/28  Continue to monitor 14. Hypoalbuminemia  Supplement initiated 7/28 15. Acute blood loss anemia  Hemoglobin 12.4 on 7/28  Continue to monitor 16.  Spasticity- Left side , L hanstring most affected has daytime spasticity , Increase zanaflex to 2mg  BID and 4mg  Qhs, pt now open botox 17.  Urinary incont- occ retention now on flomax , voids during the day,  Urecholine 10mg  TID, increase to 25mg QID  nl UA LOS (Days) 9 A FACE TO FACE EVALUATION WAS PERFORMED  Frank Moss E 02/20/2017 7:11 AM

## 2017-02-20 NOTE — Plan of Care (Signed)
Problem: RH BOWEL ELIMINATION Goal: RH STG MANAGE BOWEL WITH ASSISTANCE STG Manage Bowel with [facility].   Outcome: Not Progressing Incont x 2 yesterday

## 2017-02-21 ENCOUNTER — Inpatient Hospital Stay (HOSPITAL_COMMUNITY): Payer: 59 | Admitting: Occupational Therapy

## 2017-02-21 ENCOUNTER — Inpatient Hospital Stay (HOSPITAL_COMMUNITY): Payer: 59 | Admitting: Speech Pathology

## 2017-02-21 ENCOUNTER — Inpatient Hospital Stay (HOSPITAL_COMMUNITY): Payer: 59 | Admitting: Physical Therapy

## 2017-02-21 LAB — GLUCOSE, CAPILLARY
GLUCOSE-CAPILLARY: 128 mg/dL — AB (ref 65–99)
GLUCOSE-CAPILLARY: 145 mg/dL — AB (ref 65–99)
Glucose-Capillary: 192 mg/dL — ABNORMAL HIGH (ref 65–99)
Glucose-Capillary: 205 mg/dL — ABNORMAL HIGH (ref 65–99)

## 2017-02-21 MED ORDER — TERAZOSIN HCL 2 MG PO CAPS
2.0000 mg | ORAL_CAPSULE | Freq: Two times a day (BID) | ORAL | Status: DC
  Administered 2017-02-21 – 2017-02-23 (×6): 2 mg via ORAL
  Filled 2017-02-21 (×7): qty 1

## 2017-02-21 NOTE — Plan of Care (Signed)
Problem: RH BLADDER ELIMINATION Goal: RH STG MANAGE BLADDER WITH ASSISTANCE STG Manage Bladder incontinence With minimal Assistance   Outcome: Not Progressing Unable to void

## 2017-02-21 NOTE — Progress Notes (Signed)
Speech Language Pathology Daily Session Note  Patient Details  Name: Frank Moss MRN: [id number] Date of Birth: 02-16-1971  Today's Date: 02/21/2017 SLP Individual Time: 1330-1400 SLP Individual Time Calculation (min): 30 min  Short Term Goals: Week 2: SLP Short Term Goal 1 (Week 2): Pt will sustain his attention to basic, familiar tasks for 5-7 minutes with mod assist verbal cues for redirection.   SLP Short Term Goal 2 (Week 2): Pt will utilize external aids to recall basic, daily information with mod assist verbal cues.   SLP Short Term Goal 3 (Week 2): Pt will complete basic, familiar tasks with mod assist verbal cues for functional problem solving.   SLP Short Term Goal 4 (Week 2): Pt will attend/scan to left field of enviornment/body during functional tasks with Mod A verbal cues.   Skilled Therapeutic Interventions: Skilled treatment session focused on cognitive goals. SLP facilitated session by providing Mod A verbal cues for problem solving and selective attention in a mildly distracting environment for ~2 minute intervals during a mildly complex medication management task. Patient demonstrated appropriate emergent awareness in regards to poor attention to task. Patient left upright in wheelchair with all needs within reach. Continue with current plan of care.       Function:   Cognition Comprehension Comprehension assist level: Understands basic 90% of the time/cues < 10% of the time  Expression   Expression assist level: Expresses basic 90% of the time/requires cueing < 10% of the time.  Social Interaction Social Interaction assist level: Interacts appropriately 90% of the time - Needs monitoring or encouragement for participation or interaction.  Problem Solving Problem solving assist level: Solves basic 50 - 74% of the time/requires cueing 25 - 49% of the time  Memory Memory assist level: Recognizes or recalls 50 - 74% of the time/requires cueing 25 - 49% of the time     Pain No/Denies Pain   Therapy/Group: Individual Therapy  Aleecia Tapia 02/21/2017, 3:09 PM

## 2017-02-21 NOTE — Plan of Care (Signed)
Problem: RH BLADDER ELIMINATION Goal: RH STG MANAGE BLADDER WITH ASSISTANCE STG Manage Bladder incontinence With minimal Assistance   Outcome: Not Progressing Total assist- requiring I& O caths q 6 hours

## 2017-02-21 NOTE — Progress Notes (Signed)
Occupational Therapy Session Note  Patient Details  Name: Frank Moss MRN: [id number] Date of Birth: Feb 15, 1971  Today's Date: 02/21/2017 OT Individual Time: [id number] OT Individual Time Calculation (min): 30 min    Skilled Therapeutic Interventions/Progress Updates:    Tx focus on NMR, cognitive remediation, and sitting balance without UE support during functional tasks.   Pt greeted in bed, agreeable to tx. Pt completed supine<sit with Mod-Max A and instruction on sequencing. Once EOB, pt able to maintain static sitting balance with min guard. OT modeled towel folding technique for pt to replicate with 1 hand towel. Pt unable to self organize without OT completing first step of task. Pt bilaterally integrating UEs with mod tactile cues. Was provided with extra time for motor planning/problem solving. Pt able to fold small wash cloths without physical assist. At end of tx pt was returned to supine. He boosted himself up in bed with use of headboard and repositioned himself with step by step instruction. Pt left with all needs within reach, 4 bedrails up, and bed alarm activated.   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   Therapy/Group: Individual Therapy  Gerline Ratto A Alazne Quant 02/21/2017, 12:31 PM

## 2017-02-21 NOTE — Progress Notes (Signed)
Occupational Therapy Session Note  Patient Details  Name: Frank Moss MRN: [id number] Date of Birth: 02-14-1971  Today's Date: 02/21/2017 OT Individual Time: 1115-1200 OT Individual Time Calculation (min): 45 min    Short Term Goals: Week 2:  OT Short Term Goal 1 (Week 2): Pt will complete UB dressing with min assist to donn pullover shirt.  OT Short Term Goal 2 (Week 2): Pt will donn pull up pants with mod assist sit to stand.  OT Short Term Goal 3 (Week 2): Pt will initiate use of L UE during bathing tasks with min questioning cue 75% of tasks OT Short Term Goal 4 (Week 2): Pt will locate 2/4 grooming items on L side of sink with min cues  Skilled Therapeutic Interventions/Progress Updates:    1:1 OT treatment session focused on L side attention, functional use of L UE, and standing balance within modified BADL tasks. Pt transferred sup<>sit with assist to advance L LE, instructional cues and increased time to initiate. Pt then completed stand-pivot transfer to R with 2 trials, multimodal cues and mod A overall. Facilitated B LE coordination to walk feet in wc toward sink with max vc to integrate L LE. Incorporated CIMT techniques to force us of L UE during bathing tasks. Guided assist and max instructional cues for initiation and motor planning. Pt able to achieve figure 4 position with assistance on L LE which increased acces to LB for donning/doffing socks, washing feet, and  threading pants. Sit<>stand at the sink with increased time and mod A. Once standing, pt needed mod A overall to maintain standing balance while pt reached to wash buttocks, peri-area, then to pull pants over hips.. Provided verbal and tactile decrease lateral lean to L and incorporated forces use of L UE when pulling up pants. Pt left seated in TIS wc tilted back with leg rests and safety belt on. Call bell in hand.   Therapy Documentation Precautions:  Precautions Precautions: Fall Precaution Comments: pusher to  the left, apraxia, decreased initiation, left hemiparesis Restrictions Weight Bearing Restrictions: No Pain: Pain Assessment Pain Assessment: 0-10 Pain Score: 0-No pain  See Function Navigator for Current Functional Status.   Therapy/Group: Individual Therapy  Mal Amabilelisabeth S Shakirra Buehler 02/21/2017, 11:35 AM

## 2017-02-21 NOTE — Progress Notes (Signed)
[place] PHYSICAL MEDICINE & REHABILITATION     PROGRESS NOTE  Subjective/Complaints:   No issues overnite, appreciate PT note  ROS: Denies CP, SOB, nausea, vomiting, diarrhea.  Objective: Vital Signs: Blood pressure (!) 156/71, pulse 89, temperature 98.9 F (37.2 C), temperature source Oral, resp. rate 18, height 6\' 2"  (1.88 m), weight 103 kg (227 lb 0.7 oz), SpO2 100 %. No results found. No results for input(s): WBC, HGB, HCT, PLT in the last 72 hours. No results for input(s): NA, K, CL, GLUCOSE, BUN, CREATININE, CALCIUM in the last 72 hours.  Invalid input(s): CO CBG (last 3)   Recent Labs  02/20/17 1659 02/20/17 2052 02/21/17 0622  GLUCAP 193* 172* 128*    Wt Readings from Last 3 Encounters:  02/16/17 103 kg (227 lb 0.7 oz)  02/08/17 103.7 kg (228 lb 11.2 oz)  02/08/17 124.3 kg (274 lb)    Physical Exam:  BP (!) 156/71 (BP Location: Right Arm)   Pulse 89   Temp 98.9 F (37.2 C) (Oral)   Resp 18   Ht 6\' 2"  (1.88 m)   Wt 103 kg (227 lb 0.7 oz)   SpO2 100%   BMI 29.15 kg/m  Constitutional: He appears well-developedand well-nourished.  HENT: Normocephalicand atraumatic.  Eyes: EOMI. No discharge. Cardiovascular: RRR. No JVD. Respiratory: Effort normal breath sounds normal.  GI: Soft. Bowel sounds are normal.   Neurological: He is alertand oriented.  Left facial weakness with left gaze preference and right neglect, improving.  Delayed processing with slow speech. His attention is poor and he lacks awareness/insight into deficits. Spastic left hemiparesis  Motor: Left lower extremity: 2+/5 proximal to distal Skin: Skin is warmand dry.  Psychiatric: His affect is blunt. His speech is delayed. He is slowed. He expresses inappropriate judgment. He is inattentive.    Assessment/Plan: 1. 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 Physiatrist is providing close team supervision and 24 hour management of active medical problems listed below. Physiatrist and rehab team continue to assess barriers to discharge/monitor patient progress toward functional and medical goals.  Function:  Bathing Bathing position   Position: Sitting EOB  Bathing parts Body parts bathed by patient: Left arm, Chest, Abdomen, Front perineal area, Right upper leg, Left upper leg Body parts bathed by helper: Right lower leg, Left lower leg, Back, Right arm  Bathing assist Assist Level: Touching or steadying assistance(Pt > 75%)      Upper Body Dressing/Undressing Upper body dressing   What is the patient wearing?: Pull over shirt/dress     Pull over shirt/dress - Perfomed by patient: Put head through opening, Thread/unthread right sleeve Pull over shirt/dress - Perfomed by helper: Pull shirt over trunk, Thread/unthread left sleeve        Upper body assist Assist Level: Touching or steadying assistance(Pt > 75%)      Lower Body Dressing/Undressing Lower body dressing   What is the patient wearing?: Pants, Non-skid slipper socks   Underwear - Performed by helper: Thread/unthread right underwear leg, Thread/unthread left underwear leg, Pull underwear up/down Pants- Performed by patient: Thread/unthread right pants leg Pants- Performed by helper: Thread/unthread right pants leg, Thread/unthread left pants leg, Pull pants up/down   Non-skid slipper socks- Performed by helper: Don/doff right sock, Don/doff left sock       Shoes - Performed by helper: Don/doff right shoe, Don/doff left shoe, Fasten right, [employer]  left          Lower body assist Assist for lower body dressing: Touching or steadying assistance (Pt > 75%)      Toileting Toileting     Toileting steps completed by helper: Adjust clothing prior to toileting, Performs perineal hygiene, Adjust clothing after toileting Toileting Assistive Devices: Grab bar  or rail, Toilet aid  Toileting assist Assist level: Two helpers   Transfers Chair/bed transfer   Chair/bed transfer method: Stand pivot, Squat pivot Chair/bed transfer assist level: 2 helpers Chair/bed transfer assistive device: Armrests Mechanical lift: Landscape architect Ambulation activity did not occur: Safety/medical concerns   Max distance: 5 ft Assist level: Maximal assist (Pt 25 - 49%)   Wheelchair Wheelchair activity did not occur: Safety/medical concerns Type: Manual      Cognition Comprehension Comprehension assist level: Understands basic 90% of the time/cues < 10% of the time  Expression Expression assist level: Expresses basic 90% of the time/requires cueing < 10% of the time.  Social Interaction Social Interaction assist level: Interacts appropriately 90% of the time - Needs monitoring or encouragement for participation or interaction.  Problem Solving Problem solving assist level: Solves basic 90% of the time/requires cueing < 10% of the time  Memory Memory assist level: Recognizes or recalls 90% of the time/requires cueing < 10% of the time    Medical Problem List and Plan: 1.  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  DVT Prophylaxis/Anticoagulation: Pharmaceutical: Lovenox 3. H/o lumbago/Pain Management: Denies pain at this moment. Tylenol prn 4. Mood: LCSW to follow for evaluation and support.  5. Neuropsych: This patient is not capable of making decisions on his  own behalf. 6. Skin/Wound Care: routine pressure relief measures 7. Fluids/Electrolytes/Nutrition: Monitor I/Os--offer supplements if intake poor.   8.T2DM: monitor BS ac/hs. Continue lantus daily with meal coverage. Will use SSI for elevated BS.    CBG (last 3) not being monitored will check ac TID, Increase lantus to 36U  Recent Labs  02/20/17 1659 02/20/17 2052  02/21/17 0622  GLUCAP 193* 172* 128*    9. HTN:  Monitor BP bid  Norvasc 5mg  daily, increased to 7.5 on 7/28, will increase to 10mg  7/30- monitor for effect, systolic elevated change flomax to Hytrin 8/3 systolic improving in am but elevated in pm increase to BID  Vitals:   02/20/17 2111 02/21/17 0522  BP: (!) 175/69 (!) 156/71  Pulse:  89  Resp:  18  Temp:  98.9 F (37.2 C)   10 H/o urinary retention: currently incontinent. Toilet patient every 4 hours and cath if retention noted.   11. Hyperlipidemia: On atorvastatin.  12. Question CKD: baseline SCr around 1.6 per records review. Continue to monitor.  Offer fluids between meals for adequate hydration.   Cr. 1.30 on 7/28  Cont to monitor 13. Hyponatremia  Sodium 134 on 7/28  Continue to monitor 14. Hypoalbuminemia  Supplement initiated 7/28 15. Acute blood loss anemia  Hemoglobin 12.4 on 7/28  Continue to monitor 16.  Spasticity- Left side , L hanstring most affected has daytime spasticity , Increase zanaflex to 2mg  BID and 4mg  Qhs,Botox 100 U to L hamstring on 8/5 17.  Urinary incont- occ retention now on flomax , voids during the day,  increase to 25mg QID  nl UA LOS (Days) 10 A FACE TO FACE EVALUATION WAS PERFORMED  Claudette Laws E  02/21/2017 6:59 AM

## 2017-02-21 NOTE — Plan of Care (Signed)
Problem: RH BOWEL ELIMINATION Goal: RH STG MANAGE BOWEL WITH ASSISTANCE STG Manage Bowel with minimal [facility].   Outcome: Not Progressing Incontinent total assist

## 2017-02-21 NOTE — Progress Notes (Signed)
Physical Therapy Session Note  Patient Details  Name: Frank Moss MRN: [id number] Date of Birth: Nov 30, 1970  Today's Date: 02/21/2017 PT Individual Time: 1415-1540 PT Individual Time Calculation (min): 85 min   Short Term Goals: Week 2:  PT Short Term Goal 1 (Week 2): Pt will ambulate 4150' with +1 assist PT Short Term Goal 2 (Week 2): Pt will negotiate 4 steps with +1 assist PT Short Term Goal 3 (Week 2): Pt will tolerate sitting in standard w/c with supervision for safety PT Short Term Goal 4 (Week 2): Pt will initiate w/c mobility training   Skilled Therapeutic Interventions/Progress Updates:    pt c/o of L LE pain during knee extension with increased tone noted from last weeks session. PT session focused on L LE extension during stance, ambulation, sustained attention, sitting posture, initiation, and L UE coordination.  Pt sitting in TIS w/c upon arrival, agreeable to PT treatment session. PT rolled pt in w/c to/from therapy gym. PT performed L LE hamstring stretch in sitting 2 x 30second hold with noticed increased L LE tone compared to last week. Sit to stand x 3 in parallel bars with min A for steadying. Pt standing with max-total facilitation for L foot placement and L weight shift. Pt unable to reach L knee extension and foot flat in standing, reporting pain in L hamstrings. Ambulated 110ft at hallway rail progresing to 2320ft in [place] position with max A for trunk control and L LE swing phase with pt unable to get foot flat during L LE stance phase. Squat pivot transfer to L w/c to mat with +2 assist for lifting and pivoting with max multimodal cues for technique and sequencing. Attempted L lateral scooting sitting EOM with pt unable to sequencing head-hip relationship. Sitting EOM pt completed PEG board pattern with L UE working on sustained attention, initiation, L UE coordination, and sitting posture. Pt required max multimodal cues for L UE initiation to complete task. PT provided  tactile input for upright posture with pt continuing to sit in posterior pelvic tilt, unable to correct despite max multimodal cues. Squat pivot transfer to R mat to TIS w/c with max A for lifting and pivoting. PT adjusted head support on w/c for better midline alignment and lengthened bilateral leg rests to prevent R LE pushing during sitting. Pt returned to room and left in TIS w/c with QRB in place and call bell in reach.   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   Therapy/Group: Individual Therapy  Zyiah Withington 02/21/2017, 5:38 PM

## 2017-02-22 ENCOUNTER — Inpatient Hospital Stay (HOSPITAL_COMMUNITY): Payer: 59 | Admitting: Occupational Therapy

## 2017-02-22 ENCOUNTER — Inpatient Hospital Stay (HOSPITAL_COMMUNITY): Payer: 59

## 2017-02-22 ENCOUNTER — Inpatient Hospital Stay (HOSPITAL_COMMUNITY): Payer: 59 | Admitting: Physical Therapy

## 2017-02-22 DIAGNOSIS — I69319 Unspecified symptoms and signs involving cognitive functions following cerebral infarction: Secondary | ICD-10-CM

## 2017-02-22 LAB — GLUCOSE, CAPILLARY
GLUCOSE-CAPILLARY: 149 mg/dL — AB (ref 65–99)
GLUCOSE-CAPILLARY: 238 mg/dL — AB (ref 65–99)
Glucose-Capillary: 96 mg/dL (ref 65–99)
Glucose-Capillary: 125 mg/dL — ABNORMAL HIGH (ref 65–99)

## 2017-02-22 MED ORDER — INSULIN GLARGINE 100 UNIT/ML ~~LOC~~ SOLN
35.0000 [IU] | Freq: Every day | SUBCUTANEOUS | Status: DC
  Administered 2017-02-22 – 2017-03-07 (×14): 35 [IU] via SUBCUTANEOUS
  Filled 2017-02-22 (×14): qty 0.35

## 2017-02-22 NOTE — Progress Notes (Signed)
Occupational Therapy Session Note  Patient Details  Name: Frank Moss MRN: [id number] Date of Birth: [birth year]  Today's Date: 02/22/2017 OT Individual Time: 1018-1100 OT Individual Time Calculation (min): 42 min   Short Term Goals: Week 2:  OT Short Term Goal 1 (Week 2): Pt will complete UB dressing with min assist to donn pullover shirt.  OT Short Term Goal 2 (Week 2): Pt will donn pull up pants with mod assist sit to stand.  OT Short Term Goal 3 (Week 2): Pt will initiate use of L UE during bathing tasks with min questioning cue 75% of tasks OT Short Term Goal 4 (Week 2): Pt will locate 2/4 grooming items on L side of sink with min cues  Skilled Therapeutic Interventions/Progress Updates:    1:1 OT treatment session focused on functional use of L UE, L side attention/body awareness, B UE coordination and standing balance within modified bathing/dressing tasks. Pt completed stand-pivot transfer to wc on R side with 2 trials as on first attempt, pt with severe push to L and unsafe to facilitate weight shift. OT placed pt's R hand onto wc arm rest and provided max verbal and tactile cues to initiate and facilitate stand-pivot transfer w/ max A. Max instructional cues and guided A to integrate L UE into bathing tasks. Pt initiated use of L UE to assist with opening deoodorant, and to ring out wash cloth. Sit<>stand at the sink with mod A, then max A to maintain standing balance while OT facilitated weight shift onto LLE while blocking L knee as pt washed peri-area and buttocks. OT assisted with threading pant legs for time management, then incorporated forced use of L UE to stand and pull pants up over hips. Pt able to achieve figure 4 position and don socks with min A. B UE coordination task of shoe tying, utilized backwards chaining and hand over hand to facilitate L UE pinch and pull. Pt left seated in wc with L hip/knee supported to neutral, call bell in reach, tilted, and safety belt on.    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   Therapy/Group: Individual Therapy  Mal Amabilelisabeth S Vernecia Umble 02/22/2017, 10:59 AM

## 2017-02-22 NOTE — Progress Notes (Signed)
Physical Therapy Session Note  Patient Details  Name: Frank Moss MRN: [id number] Date of Birth: 11/23/1970  Today's Date: 02/22/2017 PT Individual Time: 1345-1500 PT Individual Time Calculation (min): 75 min   Short Term Goals: Week 2:  PT Short Term Goal 1 (Week 2): Pt will ambulate 3950' with +1 assist PT Short Term Goal 2 (Week 2): Pt will negotiate 4 steps with +1 assist PT Short Term Goal 3 (Week 2): Pt will tolerate sitting in standard w/c with supervision for safety PT Short Term Goal 4 (Week 2): Pt will initiate w/c mobility training   Skilled Therapeutic Interventions/Progress Updates:    pt c/o L LE pain in hamstrings upon knee extension and in standing. PT treatment session focused on L UE initiation and coordination, blocked transfer training, sitting balance, and standing tolerance.  Pt handoff from OT sitting EOM in therapy gym. Sitting EOM with supervision for safety performing L UE reaching task focused on initiation, sustained attention, sitting balance, and UE coordination with tactile cues provided at elbow for L UE initiation. Scooting R with supervision and verbal cues for head-hips relationship. Attempted standing with R weight shift to wall then coming back to midline orientation, but unable to get L foot flat and pt reporting increased L LE pain. Attempted sitting EOM hamstring stretch with increased L LE pain. Sit to supine with +2 assist for trunk lowering and LE management due to pain. Attempted supine L LE hamstring stretch with significantly increased pain and pt unable to tolerate. Supine to sit with + 2 assist for forward trunk weight shift and LE management due to pain. Squat pivot mat to w/c with max A for lifting and pivoting. Sit to stand with min guard assist in standing frame and pt unable to put weight through L LE due to pain despite max multimodal cues for L weight shift. Blocked practice squat pivot transfers mat <> w/c with mod A to the R and max A to L with  PT providing facilitation for forward trunk lean and for pivoting hips to L. Pt returned to room and sit to stand at sink with min assist for balance for 2nd PT to change brief. Pt left sitting in w/c with call bell in reach and QRB on.    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   Therapy/Group: Individual Therapy  Henritta Mutz 02/22/2017, 5:29 PM

## 2017-02-22 NOTE — Progress Notes (Signed)
Speech Language Pathology Daily Session Note  Patient Details  Name: Frank Moss MRN: [id number] Date of Birth: 1970-11-03  Today's Date: 02/22/2017 SLP Individual Time: [id number] SLP Individual Time Calculation (min): 45 min  Short Term Goals: Week 2: SLP Short Term Goal 1 (Week 2): Pt will sustain his attention to basic, familiar tasks for 5-7 minutes with mod assist verbal cues for redirection.   SLP Short Term Goal 2 (Week 2): Pt will utilize external aids to recall basic, daily information with mod assist verbal cues.   SLP Short Term Goal 3 (Week 2): Pt will complete basic, familiar tasks with mod assist verbal cues for functional problem solving.   SLP Short Term Goal 4 (Week 2): Pt will attend/scan to left field of enviornment/body during functional tasks with Mod A verbal cues.   Skilled Therapeutic Interventions: Skilled ST treatment focused on cognitive goals. SLP facilitated repositioning in bed following pt request, pt moved back into the same position after several attempts to reposition demonstrated impairment in awareness. SLP facilitated problem solving and selective attention in midly distraction environment for 2 minute intervals during mildly complex mediation managment tasks with Mod A verbal and visual cues and demonstrated emergent awareness with poor attention to task. Pt was left in bed with call bell within reach. Recommend to continue ST services.      Function:  Cognition Comprehension Comprehension assist level: Understands basic 90% of the time/cues < 10% of the time;Understands basic 75 - 89% of the time/ requires cueing 10 - 24% of the time  Expression   Expression assist level: Expresses basic 90% of the time/requires cueing < 10% of the time.  Social Interaction Social Interaction assist level: Interacts appropriately 75 - 89% of the time - Needs redirection for appropriate language or to initiate interaction.  Problem Solving Problem solving assist level:  Solves basic 50 - 74% of the time/requires cueing 25 - 49% of the time  Memory Memory assist level: Recognizes or recalls 50 - 74% of the time/requires cueing 25 - 49% of the time    Pain Pain Assessment Pain Assessment: Faces Pain Score: 0-No pain Faces Pain Scale: Hurts little more Pain Type: Acute pain Pain Location: Leg Pain Orientation: Left  Therapy/Group: Individual Therapy  Neelam Tiggs  [facility] 02/22/2017, 9:52 AM

## 2017-02-22 NOTE — Progress Notes (Signed)
Occupational Therapy Note  Patient Details  Name: Frank Moss MRN: [id number] Date of Birth: 26-May-1971  Today's Date: 02/22/2017 OT Individual Time: 1300-1330 OT Individual Time Calculation (min): 30 min   On going c/o pain in left LE with full extension and weight bearing- provided rest breaks as pt needed.  1:1 focus on therapeutic activity with focus on squat pivot transfers with tactile cues for intiation and follow through with control (with mod A). Sit to stands with focus on weight shifting far to the right to reach the wall and then return to midline without shifting too far to the left and maintain control at midline while relaxing left LE to make contact with the floor. In sitting continued to focus on weight shifting to the right and forward with functional reach of the left UE. Pt required and benefited from initial tactile cue  For pt to follow through (with errorless learning technique)- with this pt able to follow through with decr amt of time to loose concentration and for left hand to "wander." Left in the gym with next therapist.   Adan SisSmith, Kloi Brodman Lynsey 02/22/2017, 4:25 PM

## 2017-02-22 NOTE — Progress Notes (Signed)
[facility] PHYSICAL MEDICINE & REHABILITATION     PROGRESS NOTE  Subjective/Complaints:   No new issues overnite low post void  ROS: Denies CP, SOB, nausea, vomiting, diarrhea.  Objective: Vital Signs: Blood pressure (!) 155/84, pulse 87, temperature 98.5 F (36.9 C), temperature source Oral, resp. rate 18, height 6\' 2"  (1.88 m), weight 103 kg (227 lb 0.7 oz), SpO2 98 %. No results found. No results for input(s): WBC, HGB, HCT, PLT in the last 72 hours. No results for input(s): NA, K, CL, GLUCOSE, BUN, CREATININE, CALCIUM in the last 72 hours.  Invalid input(s): CO CBG (last 3)   Recent Labs  02/21/17 1219 02/21/17 1641 02/21/17 2130  GLUCAP 145* 192* 205*    Wt Readings from Last 3 Encounters:  02/16/17 103 kg (227 lb 0.7 oz)  02/08/17 103.7 kg (228 lb 11.2 oz)  02/08/17 124.3 kg (274 lb)    Physical Exam:  BP (!) 155/84 (BP Location: Right Arm)   Pulse 87   Temp 98.5 F (36.9 C) (Oral)   Resp 18   Ht 6\' 2"  (1.88 m)   Wt 103 kg (227 lb 0.7 oz)   SpO2 98%   BMI 29.15 kg/m  Constitutional: He appears well-developedand well-nourished.  HENT: Normocephalicand atraumatic.  Eyes: EOMI. No discharge. Cardiovascular: RRR. No JVD. Respiratory: Effort normal breath sounds normal.  GI: Soft. Bowel sounds are normal.   Neurological: He is alertand oriented.  Left facial weakness with left gaze preference and right neglect, improving.  Delayed processing with slow speech. His attention is poor and he lacks awareness/insight into deficits. Spastic left hemiparesis  Motor: Left lower extremity: 2+/5 proximal to distal Skin: Skin is warmand dry.  Psychiatric: His affect is blunt. His speech is delayed. He is slowed. He expresses inappropriate judgment. He is inattentive.    Assessment/Plan: 1. 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 Physiatrist is providing close team supervision and 24 hour management of active medical problems listed below. Physiatrist and rehab team continue to assess barriers to discharge/monitor patient progress toward functional and medical goals.  Function:  Bathing Bathing position   Position: Wheelchair/chair at sink  Bathing parts Body parts bathed by patient: Left arm, Chest, Abdomen, Front perineal area, Right upper leg, Left upper leg, Left lower leg, Right lower leg, Right arm Body parts bathed by helper: Back, Buttocks  Bathing assist Assist Level: Touching or steadying assistance(Pt > 75%)      Upper Body Dressing/Undressing Upper body dressing   What is the patient wearing?: Pull over shirt/dress     Pull over shirt/dress - Perfomed by patient: Put head through opening, Pull shirt over trunk Pull over shirt/dress - Perfomed by helper: Thread/unthread right sleeve, Thread/unthread left sleeve        Upper body assist Assist Level: Touching or steadying assistance(Pt > 75%)      Lower Body Dressing/Undressing Lower body dressing   What is the patient wearing?: Pants, Non-skid slipper socks   Underwear - Performed by helper: Thread/unthread right underwear leg, Thread/unthread left underwear leg, Pull underwear up/down Pants- Performed by patient: Thread/unthread right pants leg Pants- Performed by helper: Thread/unthread left pants leg, Pull pants up/down Non-skid slipper socks- Performed by patient: Don/doff right sock Non-skid slipper socks- Performed by helper: Don/doff left sock       Shoes - Performed by helper: Don/doff right shoe, Don/doff left shoe,  Fasten right, Fasten left          Lower body assist Assist for lower body dressing: Touching or steadying assistance (Pt > 75%)      Toileting Toileting     Toileting steps completed by helper: Adjust clothing prior to toileting, Performs perineal hygiene, Adjust clothing after toileting Toileting  Assistive Devices: Grab bar or rail, Toilet aid  Toileting assist Assist level: Two helpers   Transfers Chair/bed transfer   Chair/bed transfer method: Squat pivot Chair/bed transfer assist level: Maximal assist (Pt 25 - 49%/lift and lower) Chair/bed transfer assistive device: Armrests Mechanical lift: Stedy   Locomotion Ambulation Ambulation activity did not occur: Safety/medical concerns   Max distance: 30ft Assist level: Maximal assist (Pt 25 - 49%)   Wheelchair49 Wheelchair activity did not occur: Safety/medical concerns Type: Manual      Cognition Comprehension Comprehension assist level: Understands basic 90% of the time/cues < 10% of the time  Expression Expression assist level: Expresses basic 90% of the time/requires cueing < 10% of the time.  Social Interaction Social Interaction assist level: Interacts appropriately 90% of the time - Needs monitoring or encouragement for participation or interaction.  Problem Solving Problem solving assist level: Solves basic 90% of the time/requires cueing < 10% of the time  Memory Memory assist level: Recognizes or recalls 90% of the time/requires cueing < 10% of the time    Medical Problem List and Plan: 1.  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  DVT Prophylaxis/Anticoagulation: Pharmaceutical: Lovenox 3. H/o lumbago/Pain Management: Denies pain at this moment. Tylenol prn 4. Mood: LCSW to follow for evaluation and support.  5. Neuropsych: This patient is not capable of making decisions on his  own behalf. 6. Skin/Wound Care: routine pressure relief measures 7. Fluids/Electrolytes/Nutrition: Monitor I/Os--offer supplements if intake poor.   8.T2DM: monitor BS ac/hs. Continue lantus daily with meal coverage. Will use SSI for elevated BS.    CBG (last 3) not being monitored will check ac TID, Increase  lantus to 36U  Recent Labs  02/21/17 1219 02/21/17 1641 02/21/17 2130  GLUCAP 145* 192* 205*    9. HTN:  Monitor BP bid  Norvasc 5mg  daily, increased to 7.5 on 7/28, will increase to 10mg  7/30- monitor for effect, systolic elevated change flomax to Hytrin 8/3 systolic improving in am but elevated in pm increase to BID - monitor effect Vitals:   02/21/17 1950 02/22/17 0500  BP: (!) 177/91 (!) 155/84  Pulse:  87  Resp:  18  Temp:  98.5 F (36.9 C)   10 H/o urinary retention: currently incontinent. Toilet patient every 4 hours and cath if retention noted.   11. Hyperlipidemia: On atorvastatin.  12. Question CKD: baseline SCr around 1.6 per records review. Continue to monitor.  Offer fluids between meals for adequate hydration.   Cr. 1.30 on 7/28  Cont to monitor 13. Hyponatremia  Sodium 134 on 7/28  Continue to monitor 14. Hypoalbuminemia  Supplement initiated 7/28 15. Acute blood loss anemia  Hemoglobin 12.4 on 7/28  Continue to monitor 16.  Spasticity- Left side , L hanstring most affected has daytime spasticity , Increase zanaflex to 2mg  BID and 4mg  Qhs,Botox 100 U to L hamstring on 8/5 17.  Urinary incont- occ retention now on flomax , voids during the day,  increase to 25mg QID  nl UA LOS (Days)  11 A FACE TO FACE EVALUATION WAS PERFORMED  Erick Colace 02/22/2017 6:49 AM

## 2017-02-23 ENCOUNTER — Inpatient Hospital Stay (HOSPITAL_COMMUNITY): Payer: 59 | Admitting: Speech Pathology

## 2017-02-23 ENCOUNTER — Inpatient Hospital Stay (HOSPITAL_COMMUNITY): Payer: 59 | Admitting: Occupational Therapy

## 2017-02-23 ENCOUNTER — Encounter (HOSPITAL_COMMUNITY): Payer: 59 | Admitting: Psychology

## 2017-02-23 ENCOUNTER — Inpatient Hospital Stay (HOSPITAL_COMMUNITY): Payer: 59

## 2017-02-23 LAB — CBC
HEMATOCRIT: 32.7 % — AB (ref 39.0–52.0)
HEMOGLOBIN: 11 g/dL — AB (ref 13.0–17.0)
MCH: 26.8 pg (ref 26.0–34.0)
MCHC: 33.6 g/dL (ref 30.0–36.0)
MCV: 79.8 fL (ref 78.0–100.0)
Platelets: 257 10*3/uL (ref 150–400)
RBC: 4.1 MIL/uL — AB (ref 4.22–5.81)
RDW: 13.8 % (ref 11.5–15.5)
WBC: 4.9 10*3/uL (ref 4.0–10.5)

## 2017-02-23 LAB — BASIC METABOLIC PANEL
ANION GAP: 7 (ref 5–15)
BUN: 21 mg/dL — ABNORMAL HIGH (ref 6–20)
CO2: 28 mmol/L (ref 22–32)
Calcium: 8.7 mg/dL — ABNORMAL LOW (ref 8.9–10.3)
Chloride: 102 mmol/L (ref 101–111)
Creatinine, Ser: 1.25 mg/dL — ABNORMAL HIGH (ref 0.61–1.24)
GLUCOSE: 130 mg/dL — AB (ref 65–99)
POTASSIUM: 4 mmol/L (ref 3.5–5.1)
Sodium: 137 mmol/L (ref 135–145)

## 2017-02-23 LAB — GLUCOSE, CAPILLARY
GLUCOSE-CAPILLARY: 122 mg/dL — AB (ref 65–99)
GLUCOSE-CAPILLARY: 108 mg/dL — AB (ref 65–99)
GLUCOSE-CAPILLARY: 200 mg/dL — AB (ref 65–99)
GLUCOSE-CAPILLARY: 175 mg/dL — AB (ref 65–99)

## 2017-02-23 MED ORDER — METHYLPHENIDATE HCL 5 MG PO TABS
5.0000 mg | ORAL_TABLET | Freq: Two times a day (BID) | ORAL | Status: DC
  Administered 2017-02-23 – 2017-03-02 (×16): 5 mg via ORAL
  Filled 2017-02-23 (×16): qty 1

## 2017-02-23 NOTE — Progress Notes (Signed)
Speech Language Pathology Daily Session Note  Patient Details  Name: Frank Moss MRN: [id number] Date of Birth: [birth year]  Today's Date: 02/23/2017 SLP Individual Time: [id number] SLP Individual Time Calculation (min): 45 min  Short Term Goals: Week 2: SLP Short Term Goal 1 (Week 2): Pt will sustain his attention to basic, familiar tasks for 5-7 minutes with mod assist verbal cues for redirection.   SLP Short Term Goal 2 (Week 2): Pt will utilize external aids to recall basic, daily information with mod assist verbal cues.   SLP Short Term Goal 3 (Week 2): Pt will complete basic, familiar tasks with mod assist verbal cues for functional problem solving.   SLP Short Term Goal 4 (Week 2): Pt will attend/scan to left field of enviornment/body during functional tasks with Mod A verbal cues.   Skilled Therapeutic Interventions: Skilled treatment session focused on cognition goals. SLP facilitated session by providing Max A cues for sustained attention and for task initiation. With Max A multimodal cues, pt able to sustain attention for ~ 3 minutes. Pt easily aroused but difficulty maintaining attention. Pt able to complete very basic familiar task with Max A to Mod A. Pt left upright in bed, bed alarm on and all needs within reach.      Function:    Cognition Comprehension Comprehension assist level: Understands basic 75 - 89% of the time/ requires cueing 10 - 24% of the time;Understands basic 90% of the time/cues < 10% of the time  Expression   Expression assist level: Expresses basic 75 - 89% of the time/requires cueing 10 - 24% of the time. Needs helper to occlude trach/needs to repeat words.  Social Interaction Social Interaction assist level: Interacts appropriately 75 - 89% of the time - Needs redirection for appropriate language or to initiate interaction.  Problem Solving Problem solving assist level: Solves basic 75 - 89% of the time/requires cueing 10 - 24% of the time  Memory Memory  assist level: Recognizes or recalls 75 - 89% of the time/requires cueing 10 - 24% of the time    Pain    Therapy/Group: Individual Therapy  [name] 02/23/2017, 11:25 AM

## 2017-02-23 NOTE — Progress Notes (Signed)
Social Work Patient ID: Frank GeneralRaymond Moss, male   DOB: June 03, 1971, 46 y.o.   MRN: [id number] Patient/Family Conference  Patient/family in attendance: Wife and daughter per conference line  Staff in attendance: MD, Waynetta SandyElisabeth-OT, Ben-PT and Becky-SW  Main focus: team conference update regarding goals and target LOS-8/24.  Synopsis of information shared: All shared the goals they were working on with pt and the outcome they hoped to reach-min transfer-WC level and mod assist with ambulation. MD answered questions from both wife and daughter regarding prognosis, current medical status and voiding issue questions. Both made aware pt will require 24 hr physical assist ance upon discharge from CIR. Education will need to be completed with caregivers and follow up services arranged prior to discharge. All in agreement and felt questions adequately answered. Encouraged daughter to come in and attend therapies with Pt, to see where he is starting and the progress from thus point. She will try to do this before next week's conference.   Barriers/concerns expressed by patient and family:Wife expressed concerns regarding pt needing a Urology consult due to his voiding issue and if they can provide the care he will require at discharge.   Patient/family response:Appreciatative of the meeting and felt all their questions were answered.  Follow-up/action plans:Continue to update after each conference pt's progress and have them come in to see him in therapies and begin learning his care. Aware will need a ramp for home and wife to pursue a stair lift to reach second floor where the bedrooms are located. Will continue to work on discharge needs and have come in for hands on care-education when appropriate.

## 2017-02-23 NOTE — Progress Notes (Signed)
Occupational Therapy Session Note  Patient Details  Name: Frank Moss MRN: [id number] Date of Birth: 08-24-1970  Today's Date: 02/23/2017 OT Individual Time: 1030-1100 OT Individual Time Calculation (min): 30 min    Short Term Goals: Week 2:  OT Short Term Goal 1 (Week 2): Pt will complete UB dressing with min assist to donn pullover shirt.  OT Short Term Goal 2 (Week 2): Pt will donn pull up pants with mod assist sit to stand.  OT Short Term Goal 3 (Week 2): Pt will initiate use of L UE during bathing tasks with min questioning cue 75% of tasks OT Short Term Goal 4 (Week 2): Pt will locate 2/4 grooming items on L side of sink with min cues  Skilled Therapeutic Interventions/Progress Updates:    Upon entering the room, pt supine in bed with no c/o pain this session. Pt rolling L <> R with mod A for and requiring max A for LB clothing management. Pt performed supine >sit with max A to EOB. Pt seated with steady assistance for safety. Stand pivot transfer from bed >wheelchair towards L with max A for lifting and lowering assistance. Pt tilted back with quick release belt donned and call bell within reach upon exiting the room. RN and NT notified of pt position.   Therapy Documentation Precautions:  Precautions Precautions: Fall Precaution Comments: pusher to the left, apraxia, decreased initiation, left hemiparesis Restrictions Weight Bearing Restrictions: No   Pain: Pain Assessment Pain Score: 0-No pain Faces Pain Scale: No hurt  See Function Navigator for Current Functional Status.   Therapy/Group: Individual Therapy  Alen BleacherBradsher, Kariel Skillman P 02/23/2017, 12:51 PM

## 2017-02-23 NOTE — Progress Notes (Addendum)
[place] PHYSICAL MEDICINE & REHABILITATION     PROGRESS NOTE  Subjective/Complaints:   Required ICP, yesteray and last noc cath volumes are lower,  Discussed methylphenidate use to improve initiation , pt is not opposed  ROS: Denies CP, SOB, nausea, vomiting, diarrhea.  Objective: Vital Signs: Blood pressure (!) 152/82, pulse 91, temperature 98.1 F (36.7 C), temperature source Oral, resp. rate 16, height 6' 2" (1.88 m), weight 102.8 kg (226 lb 9.9 oz), SpO2 99 %. No results found.  Recent Labs  02/23/17 0442  WBC 4.9  HGB 11.0*  HCT 32.7*  PLT 257    Recent Labs  02/23/17 0442  NA 137  K 4.0  CL 102  GLUCOSE 130*  BUN 21*  CREATININE 1.25*  CALCIUM 8.7*   CBG (last 3)   Recent Labs  02/22/17 1656 02/22/17 2036 02/23/17 0644  GLUCAP 149* 238* 122*    Wt Readings from Last 3 Encounters:  02/23/17 102.8 kg (226 lb 9.9 oz)  02/08/17 103.7 kg (228 lb 11.2 oz)  02/08/17 124.3 kg (274 lb)    Physical Exam:  BP (!) 152/82 (BP Location: Right Arm)   Pulse 91   Temp 98.1 F (36.7 C) (Oral)   Resp 16   Ht 6' 2" (1.88 m)   Wt 102.8 kg (226 lb 9.9 oz)   SpO2 99%   BMI 29.10 kg/m  Constitutional: He appears well-developedand well-nourished.  HENT: Normocephalicand atraumatic.  Eyes: EOMI. No discharge. Cardiovascular: RRR. No JVD. Respiratory: Effort normal breath sounds normal.  GI: Soft. Bowel sounds are normal.   Neurological: He is alertand oriented.  Left facial weakness with left gaze preference and right neglect, improving.  Delayed processing with slow speech. His attention is poor and he lacks awareness/insight into deficits. Spastic left hemiparesis  Motor: Left lower extremity: 2+/5 proximal to distal Skin: Skin is warmand dry.  Psychiatric: His affect is blunt. His speech is delayed. He is slowed. He expresses inappropriate judgment. He is inattentive.    Assessment/Plan: 1. 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 Physiatrist is providing close team supervision and 24 hour management of active medical problems listed below. Physiatrist and rehab team continue to assess barriers to discharge/monitor patient progress toward functional and medical goals.  Function:  Bathing Bathing position   Position: Wheelchair/chair at sink  Bathing parts Body parts bathed by patient: Left arm, Chest, Abdomen, Front perineal area, Right upper leg, Left upper leg, Left lower leg, Right lower leg, Right arm Body parts bathed by helper: Back, Buttocks  Bathing assist Assist Level: Touching or steadying assistance(Pt > 75%)      Upper Body Dressing/Undressing Upper body dressing   What is the patient wearing?: Pull over shirt/dress     Pull over shirt/dress - Perfomed by patient: Put head through opening, Pull shirt over trunk Pull over shirt/dress - Perfomed by helper: Thread/unthread right sleeve, Thread/unthread left sleeve        Upper body assist Assist Level: Touching or steadying assistance(Pt > 75%)      Lower Body Dressing/Undressing Lower body dressing   What is the patient wearing?: Pants, Non-skid slipper socks   Underwear - Performed by helper: Thread/unthread right underwear leg, Thread/unthread left underwear leg, Pull underwear up/down Pants- Performed by patient: Thread/unthread right pants leg Pants- Performed by helper: Thread/unthread left pants leg, Pull pants up/down Non-skid slipper socks- Performed   by patient: Don/doff right sock Non-skid slipper socks- Performed by helper: Don/doff left sock       Shoes - Performed by helper: Don/doff right shoe, Don/doff left shoe, Fasten right, Fasten left          Lower body assist Assist for lower body dressing: Touching or steadying assistance (Pt > 75%)      Toileting Toileting     Toileting steps completed  by helper: Adjust clothing prior to toileting, Performs perineal hygiene, Adjust clothing after toileting Toileting Assistive Devices: Grab bar or rail, Toilet aid  Toileting assist Assist level: Two helpers   Transfers Chair/bed transfer   Chair/bed transfer method: Squat pivot Chair/bed transfer assist level: Maximal assist (Pt 25 - 49%/lift and lower) (w/ stedy) Chair/bed transfer assistive device: Armrests Mechanical lift: Ecologist Ambulation activity did not occur: Safety/medical concerns   Max distance: 36f Assist level: Maximal assist (Pt 25 - 49%)   Wheelchair Wheelchair activity did not occur: Safety/medical concerns Type: Manual      Cognition Comprehension Comprehension assist level: Understands basic 90% of the time/cues < 10% of the time  Expression Expression assist level: Expresses basic 90% of the time/requires cueing < 10% of the time.  Social Interaction Social Interaction assist level: Interacts appropriately 90% of the time - Needs monitoring or encouragement for participation or interaction.  Problem Solving Problem solving assist level: Solves basic 90% of the time/requires cueing < 10% of the time  Memory Memory assist level: Recognizes or recalls 90% of the time/requires cueing < 10% of the time    Medical Problem List and Plan: 1.  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 Formulized individual treatment plan based on medical history, underlying problem and comorbidities.  2.  DVT Prophylaxis/Anticoagulation: Pharmaceutical: Lovenox 3. H/o lumbago/Pain Management: Denies pain at this moment. Tylenol prn 4. Mood: LCSW to follow for evaluation and support.  5. Neuropsych: This  patient is not capable of making decisions on his  own behalf. Trial ritalin Cont lexapro 6. Skin/Wound Care: routine pressure relief measures 7. Fluids/Electrolytes/Nutrition: Monitor I/Os--offer supplements if intake poor.   8.T2DM: monitor BS ac/hs. Continue lantus daily with meal coverage. Will use SSI for elevated BS.    CBG (last 3) not being monitored will check ac TID, Increased lantus to 36U, am CBG improving  Recent Labs  02/22/17 1656 02/22/17 2036 02/23/17 0644  GLUCAP 149* 238* 122*    9. HTN:  Monitor BP bid  Norvasc 537mdaily, increased to 7.5 on 7/28, will increase to 1035m/30- monitor for effect, systolic elevated change flomax to Hytrin 8/3 systolic improving in am but elevated in pm increase to BID - monitor effect Vitals:   02/22/17 1500 02/23/17 0525  BP: (!) 151/90 (!) 152/82  Pulse: 96 91  Resp: 18 16  Temp: 99.5 F (37.5 C) 98.1 F (36.7 C)   10 H/o urinary retention: currently incontinent. Toilet patient every 4 hours and cath if retention noted.   11. Hyperlipidemia: On atorvastatin.  12. Question CKD: baseline SCr around 1.6 per records review. Continue to monitor.  Offer fluids between meals for adequate hydration.   Cr. 1.30 on 7/28, 1.25 on 8/8  Cont to monitor 13. Hyponatremia  Sodium 134 on 7/28  Continue  to monitor 14. Hypoalbuminemia  Supplement initiated 7/28 15. Acute blood loss anemia  Hemoglobin 12.4 on 7/28, 11.0 on 8/8  Continue to monitor 16.  Spasticity- Left side , L hanstring most affected has daytime spasticity , Increase zanaflex to 2mg BID and 4mg Qhs,Botox 100 U to L hamstring on 8/5 17.  Urinary incont- occ retention now on Hytrin , voids during the day,   Urecholine 25mgQID  nl UA LOS (Days) 12 A FACE TO FACE EVALUATION WAS PERFORMED  KIRSTEINS,ANDREW E 02/23/2017 7:21 AM     

## 2017-02-23 NOTE — Progress Notes (Signed)
Physical Therapy Session Note  Patient Details  Name: Frank Moss MRN: [id number] Date of Birth: Jan 16, 1971  Today's Date: 02/23/2017 PT Individual Time: 1445-1545 PT Individual Time Calculation (min): 60 min   Short Term Goals: Week 2:  PT Short Term Goal 1 (Week 2): Pt will ambulate 5350' with +1 assist PT Short Term Goal 2 (Week 2): Pt will negotiate 4 steps with +1 assist PT Short Term Goal 3 (Week 2): Pt will tolerate sitting in standard w/c with supervision for safety PT Short Term Goal 4 (Week 2): Pt will initiate w/c mobility training   Skilled Therapeutic Interventions/Progress Updates:    Pt sitting in w/c upon PT arrival, agreeable to therapy tx and denies pain. Worked on sit to stands x 3 with mod assist at parallel bars for UE support, pt with difficulty getting L LE flat on ground as tone/spasticity kicks in, PT facilitating for L LE weightbearing through the knee. Pt transferred from w/c to mat squat pivot with max assist to guide hips as pt tends to push posterior laterally to R. Worked on seated posture and finding midline while using the mirror, PT behind pt to provide facilitation for trunk extension. Seated edge of mat pt used L LE in order to grab bean bags from L beside him and place on table in front of him working on reaching and shifting weight to L, difficulty with coordination and crossing midline. Transferred pt from mat to w/c squat pivot with max assist to guide hips, max verbal cues for sequencing. Pt seated in w/c next to wall worked on leaning laterally to the left while using the wall as an extrinsic cue to touch L shoulder to wall, also using the mirror for visual feedback, min assist to guide pt's trunk, pt performed x 10. Asked pt to hold L later lean against wall but only able to hold x 4 seconds before returning to R lateral lean resting posture. Stretched calf and hamstring throughout session 2 x 1 min. Pt left seated in w/c at end of session with needs in reach.     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   Therapy/Group: Individual Therapy  Cresenciano GenreEmily van Schagen, PT, DPT 02/23/2017, 5:18 PM

## 2017-02-23 NOTE — Progress Notes (Signed)
Occupational Therapy Session Note  Patient Details  Name: Frank Moss MRN: [id number] Date of Birth: 03/12/1971  Today's Date: 02/23/2017 OT Individual Time: 1305-1400 OT Individual Time Calculation (min): 55 min    Short Term Goals: Week 2:  OT Short Term Goal 1 (Week 2): Pt will complete UB dressing with min assist to donn pullover shirt.  OT Short Term Goal 2 (Week 2): Pt will donn pull up pants with mod assist sit to stand.  OT Short Term Goal 3 (Week 2): Pt will initiate use of L UE during bathing tasks with min questioning cue 75% of tasks OT Short Term Goal 4 (Week 2): Pt will locate 2/4 grooming items on L side of sink with min cues  Skilled Therapeutic Interventions/Progress Updates:    OT treatment session focused on L NMR, functional use of L UE, UB there-ex, and L side attention. Pt completed 5 mins on arm bike with hand-over hand A to maintain grasp w/ L hand on handle. Guided assist for L apraxia to push and pull in appropriate sequence. B UE coordination, timing, hand-eye coordination with ball toss activity. Pt able to integrate L UE into catch, but had increased difficulty tossing ball using L UE.Pt then worked on L side attention, initiation, and  finger isolation with dynavision activity.  Difficulty crossing midline with L UE as noted in reaction times with L UE (Reaction time L lower quadrant- 3.98, R Lower quadrant 10.68). Pt initiated pushing all buttons on L side, but needed increased time and VC to push buttons on R. Pt scores improved with repetition x 3. Pt returned to room and left seated in wc with needs met and safety belt on.   Therapy Documentation Precautions:  Precautions Precautions: Fall Precaution Comments: pusher to the left, apraxia, decreased initiation, left hemiparesis Restrictions Weight Bearing Restrictions: No \Pain: Pain Assessment Pain Score: 0-No pain Faces Pain Scale: No hurt  See Function Navigator for Current Functional  Status.   Therapy/Group: Individual Therapy   S  02/23/2017, 1:45 PM 

## 2017-02-23 NOTE — Patient Care Conference (Signed)
Inpatient RehabilitationTeam Conference and Plan of Care Update Date: 02/23/2017   Time: 11:30 AM    Patient Name: Frank Moss      Medical Record Number: [id number]  Date of Birth: [birth year] Sex: Male         Room/Bed: 4M02C/4M02C-01 Payor Info: Payor: [employer] / Plan: Research scientist (life sciences) / Product Type: *No Product type* /    Admitting Diagnosis: CVA  Admit Date/Time:  02/11/2017  6:41 PM Admission Comments: No comment available   Primary Diagnosis:  <principal problem not specified> Principal Problem: <principal problem not specified>  Patient Active Problem List   Diagnosis Date Noted  . Diabetes mellitus type 2 in nonobese (HCC)   . Hypertensive crisis   . Diabetes mellitus (HCC)   . Benign essential HTN   . Urinary retention   . Stage 3 chronic kidney disease   . Hyponatremia   . Hypoalbuminemia due to protein-calorie malnutrition (HCC)   . Acute blood loss anemia   . Acute thalamic infarction (HCC) 02/11/2017  . Spastic hemiparesis (HCC) 02/10/2017  . Cognitive deficit due to recent stroke 02/10/2017  . Hemi-neglect of left side 02/10/2017  . CKD (chronic kidney disease), stage III 02/09/2017  . Dysphagia as late effect of stroke 02/08/2017  . Acute ischemic right ACA stroke (HCC) 01/31/2017  . Mixed hyperlipidemia 11/17/2007  . Accelerated hypertension 11/17/2007  . DM (diabetes mellitus), type 2 with peripheral vascular complications (HCC) 10/09/2007  . ERECTILE DYSFUNCTION, MILD 10/09/2007  . SHOULDER PAIN, LEFT 10/09/2007  . COLONIC POLYPS, HX OF 10/09/2007    Expected Discharge Date: Expected Discharge Date: 03/11/17  Team Members Present: Physician leading conference: Dr. Claudette Laws Social Worker Present: Dossie Der, LCSW Nurse Present: Willey Blade, RN PT Present: Midge Minium, PT OT Present: Kearney Hard, OT SLP Present: Reuel Derby, SLP PPS Coordinator present : Tora Duck, RN, CRRN     Current Status/Progress Goal Weekly Team Focus  Medical   severe dysarthria, Left flexor withdrawl hamstring, cognition impaired  empty bladder, attend to left , improve awareness of deficits  cognitive   Bowel/Bladder   incontinent b/b; lbm 8/6  pt will have continent void at least once q shift by end of rehab stay  PVR q4-6; cath for volumes or greater   Swallow/Nutrition/ Hydration             ADL's   Mod-max A transfers, Mod A ADL  Min A overall  L NMR, sitting balance, L side attention, initiation, atention, apraxia, pt/family ed   Mobility   mod-max A transfers, max A ambulation at rail  min A bed mobility and transfers, mod A ambulaiton with LRAD, supervision w/c mobility  L UE attention/initiation, standing tolerance, ambulation, L LE ROM, transfers   Communication             Safety/Cognition/ Behavioral Observations  Mod - Max A  Min A  attention, memory, problem solving   Pain   denies; scheduled zanaflex for LLE tone         Skin   CDI            *See Care Plan and progress notes for long and short-term goals.     Barriers to Discharge  Current Status/Progress Possible Resolutions Date Resolved   Physician    Medical stability;Home environment access/layout;Neurogenic Bowel & Bladder;Incontinence  flexor spasticity, apraxic  cognition, physical status   botox injection      Nursing  PT  Inaccessible home environment;Decreased caregiver support;Home environment access/layout;Other (comments)  stairs to enter home, praxis              OT                  SLP                SW                Discharge Planning/Teaching Needs:  Home with wife arranging 24 hr care, aware will need this at discharge from rehab, expecting conference call after conf today      Team Discussion:  Goals min assist with transfers, ADL's. Mod with ambulation-target for stay here. Currently mod/max level at times plus 2. MD injected with botox for left hamstring with pain his limiting him in therapies due to this. Should  begin feeling affects by this weekend. Also trial of ritalin started to help with arousal. RN-having to I & O cath-will try standing when voiding and MD started on meds. Working on iniatiation, problem solving and attention. Speech upgraded to regular diet with pills in pureed. Pt is making progress since admission  Revisions to Treatment Plan:  DC 8/24    Continued Need for Acute Rehabilitation Level of Care: The patient requires daily medical management by a physician with specialized training in physical medicine and rehabilitation for the following conditions: Daily direction of a multidisciplinary physical rehabilitation program to ensure safe treatment while eliciting the highest outcome that is of practical value to the patient.: Yes Daily medical management of patient stability for increased activity during participation in an intensive rehabilitation regime.: Yes Daily analysis of laboratory values and/or radiology reports with any subsequent need for medication adjustment of medical intervention for : Neurological problems;Mood/behavior problems  Kwali Wrinkle, Lemar LivingsRebecca G 02/23/2017, 12:51 PM

## 2017-02-23 NOTE — Consult Note (Signed)
Neuropsychological Consultation   Patient:   Frank Moss   DOB:   January 16, 1971  MR Number:  [id number]  Location:  [facility] [address] [facility] [address] [id number] [place] [address] Dept: [phone] Loc: [phone]           Date of Service:   02/23/2017  Start Time:   8 AM End Time:   9 AM  Provider/Observer:  Arley Phenix, Psy.D.       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 There was some significant thalamic involvement also identified. This event occurred on 02/08/2017. The patient had a recent prior ACA stroke in generally the same region on 01/20/2017. At that point he had been admitted to [facility] and then referred to a skilled nursing facility for rehabilitation. Assessment for therapeutic options following the second vascular event indicated maximum assist level of therapy and training. He was recommended for the comprehensive inpatient rehabilitative program. The current neuropsychological evaluation was to assist in treatment planning and care with regard to adaption and coping with the recent multiple cerebrovascular events.  Reason for Service:  Frank Moss is a 46 year old right-handed male with a history of type 2 diabetes and retinopathy along with hypertension and hyperlipidemia. He is not always been compliant with his medication. He had an initial ACA stroke on 01/20/2017 and a second vascular events while in a skilled nursing facility on 02/08/2017. Below you'll find the complete history of present illness for the current admission.  HPI: Frank Moss is a 46 year old right handed male with history of T2DM with  retinopathy, HTN, hyperlipidemia and medication noncompliance who had a right ACA stroke on 01/20/2017. He was admitted to [facility] and started on aspirin and Plavix for his stroke prophylaxis. Hemoglobin A1c was 11.2, LDL- 65, and triglycerides- 298. During that hospitalization, he had evidence of depression as well as urinary retention and a Foley catheter was placed. He was discharged to a skilled nursing facility for rehab. While at [facility] , patient had physical,occupational and speech therapy. On 02/08/2017, he had worsening of swallow function and was not able to swallow food or medications--no abnormal seizure type activity reported. He was sent to [facility] ED and admitted after MRI demonstrated small acute infarct in the right anterior thalamus, a subacute right ACA infarct, chronic microvascular ischemic changes advanced for age and chronic right occipital infarct. Cardiology evaluated the patient, recommended TEE. This was performed 7/27 and revealed normal LVF, no shunt, no thrombus or vegetation and trivial pericardial effusion. Stroke felt to be embolic due to severe A2 and P2 segment atheromatous stenosis, advanced SVD and poorly controlled diabetes. Cardiology recommending 30 day heart monitor over loop recorder. Therapy evaluation revealed need for Hunt[place] Dec Plus to maximum assist level needed for stand pivot transfers, bed mobility was mod assist X2 and occupational therapy and demonstrated max assist X 2 for toileting. He also demonstrated slow processing with decreased attention and no s/s of aspiration seen therefore on regular diet with thin liquids.   Current Status:  The patient has been having some coping issues dealing with the residual effects and significant limitations he now is experiencing from a physical and cognitive standpoint following to cerebrovascular  accidents occurring in the month of July. The patient is having reduced/slowed cognitive processing speed, issues  with attention concentration, slowed expressive language and overall weakness of motor function.  Behavioral Observation: Frank Moss  presents as a 46 y.o.-year-old Right African American Male who appeared his stated age. his dress was Appropriate and he was Well Groomed and his manners were Appropriate to the situation.  his participation was indicative of Appropriate and Drowsy behaviors.  There were  physical disabilities noted.  he displayed an appropriate level of cooperation and motivation.     Interactions:    Active Appropriate and Drowsy  Attention:   abnormal and attention span appeared shorter than expected for age  Memory:   abnormal; remote memory intact, recent memory impaired  Visuo-spatial:  within normal limits  Speech (Volume):  low  Speech:   Slowed with some word finding issues noted.  Thought Process:  Coherent and Relevant  Though Content:  WNL;   Orientation:   person, place, time/date and situation  Judgment:   Fair  Planning:   Fair  Affect:    Depressed and Lethargic  Mood:    Dysphoric  Insight:   Fair  Intelligence:   normal  Medical History:   Past Medical History:  Diagnosis Date  . Abnormal ECG   . Acute ischemic right ACA stroke (HCC)   . Coronary artery disease, occlusive   . Dysfunction of eustachian tube   . History of right ACA stroke   . Hyperlipidemia associated with type 2 diabetes mellitus (HCC)   . Hypertension   . Impotence of organic origin   . Lumbago   . Lymphadenitis    Unspecified, except mesenteric  . Type 2 diabetes, uncontrolled, with retinopathy (HCC)   . Urinary retention 01/2017        Family Med/Psych History:  Family History  Problem Relation Age of Onset  . Hypertension Mother   . Depression Mother   . Cancer Neg Hx   . Diabetes Neg Hx   . Stroke Neg Hx     Risk of Suicide/Violence: low patient denies any current suicidal or homicidal ideation.  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 There was some significant thalamic involvement also identified. This event occurred on 02/08/2017. The patient had a recent prior ACA stroke in generally the same region on 01/20/2017. At that point he had been admitted to [facility] and then referred to a skilled nursing facility for rehabilitation. Assessment for therapeutic options following the second vascular event indicated maximum assist level of therapy and training. He was recommended for the comprehensive inpatient rehabilitative program. The current neuropsychological evaluation was to assist in treatment planning and care with regard to adaption and coping with the recent multiple cerebrovascular events.  During the one-hour clinical interview/interaction with the patient the patient did show only mild issues with regard to mental status. The patient was able to clearly understand questions and process information at a adequate level to benefit from rehabilitative efforts. There was a noted slowing and information processing speed and expressive language beat but the patient was able to adequately express ideas and thoughts clearly. The patient was rather drowsy during this visit and I do plan on going in to see him again at some point may be following one of his physical therapy sessions when he  has been more active.    Diagnosis:    Spastic hemiparesis (HCC) - Plan: Ambulatory referral to Physical Medicine Rehab  Acute thalamic infarction [facility](HCC) - Plan: Ambulatory referral to Physical Medicine Rehab         Electronically Signed   _______________________ Arley PhenixJohn Rodenbough, Psy.D.

## 2017-02-24 DIAGNOSIS — I6930 Unspecified sequelae of cerebral infarction: Secondary | ICD-10-CM

## 2017-02-24 LAB — GLUCOSE, CAPILLARY
GLUCOSE-CAPILLARY: 109 mg/dL — AB (ref 65–99)
GLUCOSE-CAPILLARY: 175 mg/dL — AB (ref 65–99)
Glucose-Capillary: 94 mg/dL (ref 65–99)
Glucose-Capillary: 168 mg/dL — ABNORMAL HIGH (ref 65–99)

## 2017-02-24 MED ORDER — TERAZOSIN HCL 2 MG PO CAPS
4.0000 mg | ORAL_CAPSULE | Freq: Two times a day (BID) | ORAL | Status: DC
  Administered 2017-02-24 – 2017-03-03 (×16): 4 mg via ORAL
  Filled 2017-02-24 (×18): qty 2

## 2017-02-24 NOTE — Progress Notes (Signed)
[place] PHYSICAL MEDICINE & REHABILITATION     PROGRESS NOTE  Subjective/Complaints:   Family mtg with wife and daughter via conf call discussed bladder , mobility, spasticity Still requiring ICP ROS: Denies CP, SOB, nausea, vomiting, diarrhea.  Objective: Vital Signs: Blood pressure (!) 172/79, pulse 91, temperature 98.8 F (37.1 C), temperature source Oral, resp. rate 17, height 6\' 2"  (1.88 m), weight 102.8 kg (226 lb 9.9 oz), SpO2 99 %. No results found.  Recent Labs  02/23/17 0442  WBC 4.9  HGB 11.0*  HCT 32.7*  PLT 257    Recent Labs  02/23/17 0442  NA 137  K 4.0  CL 102  GLUCOSE 130*  BUN 21*  CREATININE 1.25*  CALCIUM 8.7*   CBG (last 3)   Recent Labs  02/23/17 1627 02/23/17 2045 02/24/17 0610  GLUCAP 200* 175* 94    Wt Readings from Last 3 Encounters:  02/23/17 102.8 kg (226 lb 9.9 oz)  02/08/17 103.7 kg (228 lb 11.2 oz)  02/08/17 124.3 kg (274 lb)    Physical Exam:  BP (!) 172/79 (BP Location: Right Arm)   Pulse 91   Temp 98.8 F (37.1 C) (Oral)   Resp 17   Ht 6\' 2"  (1.88 m)   Wt 102.8 kg (226 lb 9.9 oz)   SpO2 99%   BMI 29.10 kg/m  Constitutional: He appears well-developedand well-nourished.  HENT: Normocephalicand atraumatic.  Eyes: EOMI. No discharge. Cardiovascular: RRR. No JVD. Respiratory: Effort normal breath sounds normal.  GI: Soft. Bowel sounds are normal.   Neurological: He is alertand oriented.  Left facial weakness with left gaze preference and right neglect, improving.  Delayed processing with slow speech. His attention is poor and he lacks awareness/insight into deficits. Spastic left hemiparesis  Motor: Left lower extremity: 2+/5 proximal to distal Skin: Skin is warmand dry.  Psychiatric: His affect is blunt. His speech is delayed. He is slowed. He expresses inappropriate judgment. He is inattentive.    Assessment/Plan: 1. 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, small right thalamic infarct which  require 3+ hours per day of interdisciplinary therapy in a comprehensive inpatient rehab setting. Physiatrist is providing close team supervision and 24 hour management of active medical problems listed below. Physiatrist and rehab team continue to assess barriers to discharge/monitor patient progress toward functional and medical goals.  Function:  Bathing Bathing position   Position: Wheelchair/chair at sink  Bathing parts Body parts bathed by patient: Left arm, Chest, Abdomen, Front perineal area, Right upper leg, Left upper leg, Left lower leg, Right lower leg, Right arm Body parts bathed by helper: Back, Buttocks  Bathing assist Assist Level: Touching or steadying assistance(Pt > 75%)      Upper Body Dressing/Undressing Upper body dressing   What is the patient wearing?: Pull over shirt/dress     Pull over shirt/dress - Perfomed by patient: Put head through opening, Pull shirt over trunk Pull over shirt/dress - Perfomed by helper: Thread/unthread right sleeve, Thread/unthread left sleeve        Upper body assist Assist Level: Touching or steadying assistance(Pt > 75%)      Lower Body Dressing/Undressing Lower body dressing   What is the patient wearing?: Pants   Underwear - Performed by helper: Thread/unthread right underwear leg, Thread/unthread left underwear leg, Pull underwear up/down Pants- Performed by patient: Thread/unthread right pants leg Pants- Performed by helper: Thread/unthread left pants leg, Pull pants up/down, Fasten/unfasten pants Non-skid slipper socks- Performed by patient: Don/doff right sock Non-skid slipper  socks- Performed by helper: Don/doff left sock       Shoes - Performed by helper: Don/doff right shoe, Don/doff left shoe, Fasten right, Fasten left          Lower body assist Assist for lower body dressing:  (max A)      Toileting Toileting     Toileting steps completed by helper: Adjust clothing prior to toileting, Performs perineal  hygiene, Adjust clothing after toileting Toileting Assistive Devices: Grab bar or rail  Toileting assist Assist level: Two helpers   Transfers Chair/bed transfer   Chair/bed transfer method: Squat pivot Chair/bed transfer assist level: 2 helpers Chair/bed transfer assistive device: Armrests, Mechanical lift Mechanical lift: Landscape architecttedy   Locomotion Ambulation Ambulation activity did not occur: Safety/medical concerns   Max distance: 8030ft Assist level: Maximal assist (Pt 25 - 49%)   Wheelchair Wheelchair activity did not occur: Safety/medical concerns Type: Manual      Cognition Comprehension Comprehension assist level: Understands basic 75 - 89% of the time/ requires cueing 10 - 24% of the time, Understands basic 90% of the time/cues < 10% of the time  Expression Expression assist level: Expresses basic 75 - 89% of the time/requires cueing 10 - 24% of the time. Needs helper to occlude trach/needs to repeat words.  Social Interaction Social Interaction assist level: Interacts appropriately 75 - 89% of the time - Needs redirection for appropriate language or to initiate interaction.  Problem Solving Problem solving assist level: Solves basic 75 - 89% of the time/requires cueing 10 - 24% of the time  Memory Memory assist level: Recognizes or recalls 75 - 89% of the time/requires cueing 10 - 24% of the time    Medical Problem List and Plan: 1.  Left hemiparesis, hemisensory loss,  and cognitive deficits secondary to right ACA infarct, small right thalamic infarct on 7/24  Cont CIR PT, OT SLP, methylphenidate trial  2.  DVT Prophylaxis/Anticoagulation: Pharmaceutical: Lovenox 3. H/o lumbago/Pain Management: Denies pain at this moment. Tylenol prn 4. Mood: LCSW to follow for evaluation and support.  5. Neuropsych: This patient is not capable of making decisions on his  own behalf. Trial ritalin Cont lexapro 6. Skin/Wound Care: routine pressure relief measures 7.  Fluids/Electrolytes/Nutrition: Monitor I/Os--offer supplements if intake poor.   8.T2DM: monitor BS ac/hs. Continue lantus daily with meal coverage. Will use SSI for elevated BS.    CBG (last 3) not being monitored will check ac TID, Increased lantus to 36U, am CBG improving, monitor for hypoglycemia  Recent Labs  02/23/17 1627 02/23/17 2045 02/24/17 0610  GLUCAP 200* 175* 94    9. HTN:  Monitor BP bid  Norvasc 5mg  daily, increased to 7.5 on 7/28, will increase to 10mg  7/30- monitor for effect, systolic elevated change flomax to Hytrin 8/3 systolic  Elevated increase to 4mg  BID - monitor effect Vitals:   02/23/17 1400 02/24/17 0429  BP: (!) 157/83 (!) 172/79  Pulse: 96 91  Resp: 17 17  Temp: 98.5 F (36.9 C) 98.8 F (37.1 C)   10 H/o urinary retention: currently incontinent. Toilet patient every 4 hours and cath if retention noted.   11. Hyperlipidemia: On atorvastatin.  12. Question CKD: baseline SCr around 1.6 per records review. Continue to monitor.  Offer fluids between meals for adequate hydration.   Cr. 1.30 on 7/28, 1.25 on 8/8  Cont to monitor 13. Hyponatremia  Sodium 134 on 7/28  Continue to monitor 14. Hypoalbuminemia  Supplement initiated 7/28 15. Acute blood loss anemia  Hemoglobin 12.4 on 7/28, 11.0 on 8/8  Continue to monitor 16.  Spasticity- Left side , L hamstring most affected has daytime spasticity , Increase zanaflex to 2mg  BID and 4mg  Qhs,Botox 100 U to L hamstring on 8/5, expect some improvement around 8/12 17.  Urinary incont- occ retention now on Hytrin increase to 4mg  BID,   Urecholine 25mg QID  nl UA LOS (Days) 13 A FACE TO FACE EVALUATION WAS PERFORMED  KIRSTEINS,ANDREW E 02/24/2017 7:22 AM

## 2017-02-24 NOTE — Progress Notes (Signed)
Occupational Therapy Session Note  Patient Details  Name: Frank Moss MRN: [id number] Date of Birth: 1970-12-25  Today's Date: 02/24/2017 OT Individual Time: 0830-0900 OT Individual Time Calculation (min): 30 min    Short Term Goals: Week 2:  OT Short Term Goal 1 (Week 2): Pt will complete UB dressing with min assist to donn pullover shirt.  OT Short Term Goal 2 (Week 2): Pt will donn pull up pants with mod assist sit to stand.  OT Short Term Goal 3 (Week 2): Pt will initiate use of L UE during bathing tasks with min questioning cue 75% of tasks OT Short Term Goal 4 (Week 2): Pt will locate 2/4 grooming items on L side of sink with min cues  Skilled Therapeutic Interventions/Progress Updates:   1:1 Pt in bed asleep when arrive. Pt came to the EOB on the right side with max A due to difficulties with motor planning and pain in left LE. Pt perform squat pivot transfer with mod A after 2 attempts (uncontrolled due to apraxia).  Performed grooming at sink including washing face and brushing teeth.  Simulated transfer with STEDY to transfer into shower stall with STEDY with tub bench facing outward with mod A- in prep for later OT bathing session.   Left resting in w/c tilted back.    Therapy Documentation Precautions:  Precautions Precautions: Fall Precaution Comments: pusher to the left, apraxia, decreased initiation, left hemiparesis Restrictions Weight Bearing Restrictions: No Pain: Pain Assessment Pain Assessment: Faces Faces Pain Scale: Hurts even more Pain Type: Acute pain Pain Location: Leg Pain Orientation: Left Pain Descriptors / Indicators: Cramping Pain Intervention(s): Repositioned  See Function Navigator for Current Functional Status.   Therapy/Group: Individual Therapy  [name], [facility] 02/24/2017, 2:36 PM

## 2017-02-24 NOTE — Progress Notes (Signed)
Speech Language Pathology Daily Session Note  Patient Details  Name: Frank Moss MRN: [id number] Date of Birth: Oct 29, 1970  Today's Date: 02/24/2017 SLP Individual Time: [id number] SLP Individual Time Calculation (min): 42 min  Short Term Goals: Week 2: SLP Short Term Goal 1 (Week 2): Pt will sustain his attention to basic, familiar tasks for 5-7 minutes with mod assist verbal cues for redirection.   SLP Short Term Goal 2 (Week 2): Pt will utilize external aids to recall basic, daily information with mod assist verbal cues.   SLP Short Term Goal 3 (Week 2): Pt will complete basic, familiar tasks with mod assist verbal cues for functional problem solving.   SLP Short Term Goal 4 (Week 2): Pt will attend/scan to left field of enviornment/body during functional tasks with Mod A verbal cues.   Skilled Therapeutic Interventions:  Pt was seen for skilled ST targeting cognitive goals.  SLP facilitated the session with a novel card game to address attention to tasks, visual scanning, and basic problem solving.  Pt needed overall mod-max assist multimodal cues for task organization and working memory of task protocols and procedures which impacted his ability to problem solve through task.  Pt was able to identify cards to the left of midline with min assist verbal cues.  Pt sustained his attention to task for ~2-3 minute intervals with mod assist verbal cues for redirection.  Pt was left in wheelchair with call bell within reach.  Continue per current plan of care.    Function:  Eating Eating                 Cognition Comprehension Comprehension assist level: Understands basic 90% of the time/cues < 10% of the time  Expression   Expression assist level: Expresses basic 90% of the time/requires cueing < 10% of the time.  Social Interaction Social Interaction assist level: Interacts appropriately 75 - 89% of the time - Needs redirection for appropriate language or to initiate interaction.   Problem Solving Problem solving assist level: Solves basic 25 - 49% of the time - needs direction more than half the time to initiate, plan or complete simple activities  Memory Memory assist level: Recognizes or recalls 50 - 74% of the time/requires cueing 25 - 49% of the time    Pain Pain Assessment Pain Assessment: No/denies pain  Therapy/Group: Individual Therapy  Adalaide Jaskolski, Melanee SpryNicole L 02/24/2017, 4:29 PM

## 2017-02-24 NOTE — Progress Notes (Addendum)
Physical Therapy Session Note  Patient Details  Name: Frank Moss MRN: [id number] Date of Birth: 1970-08-01  Today's Date: 02/24/2017 PT Individual Time: 1406-1500 PT Individual Time Calculation (min): 54 min    Skilled Therapeutic Interventions/Progress Updates:    Session today focused on increasing L LE flexibility and trunk mobiltiy to promote improved technique and decreased burden of care with transfers.  Pt performed R trunk elongation stretch (side bend to left), seated activities with reaching across midline to address decreased trunk rotation, and ball rolling on mat to increase trunk extensor mobility.  Pt additionally performed numerous sit to stand transfers with PT stabilizing L LE in order to facilitate weight-bearing for pre-gait activity.  Following session, pt returned to room and left up in wheelchair with lap belt in place and call bell/phone in reach.  Vitals pre:  HR: 105 and BP: 158/80.  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   Therapy/Group: Individual Therapy  Frank Moss 02/24/2017, 3:52 PM

## 2017-02-24 NOTE — Progress Notes (Signed)
Occupational Therapy Session Note  Patient Details  Name: Frank Moss MRN: [id number] Date of Birth: [birth year]  Today's Date: 02/24/2017 OT Individual Time: 1000-1057 OT Individual Time Calculation (min): 57 min    Short Term Goals: Week 2:  OT Short Term Goal 1 (Week 2): Pt will complete UB dressing with min assist to donn pullover shirt.  OT Short Term Goal 2 (Week 2): Pt will donn pull up pants with mod assist sit to stand.  OT Short Term Goal 3 (Week 2): Pt will initiate use of L UE during bathing tasks with min questioning cue 75% of tasks OT Short Term Goal 4 (Week 2): Pt will locate 2/4 grooming items on L side of sink with min cues  Skilled Therapeutic Interventions/Progress Updates:    OT treatment session focused on modified bathing/dressing, standing balance, and functional use of L UE. Pt transferred into shower using STEDY with Mod A and multimodal cues + increased time to initiate stand.  Pt needed hand-over hand guided A to integrate L UE into bathing tasks, but was able to motor plan movement once initiated by therapist. Charlaine Dalton used to stand pt to wash buttocks with Max A, increased time, and multimodal cues to initiate stand. Pt with increased LLE pain in standing requiring max A to maintain standing balance initially, progressing to Min/Mod A. LB dressing seated in wc with assistance to thread LLE into pant leg as pt unable to tolerate figure 4 position 2/2 pain and increased tone. Sit<>stand using STEDY with verbal and tactile cues for anterior weight shift while OT assisted with pulling up pants. Pt left seated in wc at end of session tilted, with safety belt on, and needs met.   Therapy Documentation Precautions:  Precautions Precautions: Fall Precaution Comments: pusher to the left, apraxia, decreased initiation, left hemiparesis Restrictions Weight Bearing Restrictions: No Pain: Pain Assessment Pain Assessment: Faces Pain Score: 0-No pain Faces Pain Scale: Hurts  even more Pain Type: Acute pain Pain Location: Leg Pain Orientation: Left Pain Descriptors / Indicators: Cramping Pain Intervention(s): Repositioned  See Function Navigator for Current Functional Status.   Therapy/Group: Individual Therapy  Valma Cava 02/24/2017, 12:28 PM

## 2017-02-25 ENCOUNTER — Inpatient Hospital Stay (HOSPITAL_COMMUNITY): Payer: 59 | Admitting: Physical Therapy

## 2017-02-25 ENCOUNTER — Inpatient Hospital Stay (HOSPITAL_COMMUNITY): Payer: 59 | Admitting: Speech Pathology

## 2017-02-25 ENCOUNTER — Inpatient Hospital Stay (HOSPITAL_COMMUNITY): Payer: 59 | Admitting: Occupational Therapy

## 2017-02-25 DIAGNOSIS — E0810 Diabetes mellitus due to underlying condition with ketoacidosis without coma: Secondary | ICD-10-CM

## 2017-02-25 DIAGNOSIS — R414 Neurologic neglect syndrome: Secondary | ICD-10-CM

## 2017-02-25 LAB — GLUCOSE, CAPILLARY
GLUCOSE-CAPILLARY: 75 mg/dL (ref 65–99)
GLUCOSE-CAPILLARY: 133 mg/dL — AB (ref 65–99)
GLUCOSE-CAPILLARY: 171 mg/dL — AB (ref 65–99)
GLUCOSE-CAPILLARY: 121 mg/dL — AB (ref 65–99)

## 2017-02-25 NOTE — Progress Notes (Addendum)
Speech Language Pathology Weekly Progress and Session Note  Patient Details  Name: Frank Moss MRN: [id number] Date of Birth: [birth year]  Beginning of progress report period:   February 18, 2017  End of progress report period: February 25, 2017   Today's Date: 02/25/2017 SLP Individual Time: 1305-1400 SLP Individual Time Calculation (min): 55 min  Short Term Goals: Week 2: SLP Short Term Goal 1 (Week 2): Pt will sustain his attention to basic, familiar tasks for 5-7 minutes with mod assist verbal cues for redirection.   SLP Short Term Goal 1 - Progress (Week 2): Met SLP Short Term Goal 2 (Week 2): Pt will utilize external aids to recall basic, daily information with mod assist verbal cues.   SLP Short Term Goal 2 - Progress (Week 2): Met SLP Short Term Goal 3 (Week 2): Pt will complete basic, familiar tasks with mod assist verbal cues for functional problem solving.   SLP Short Term Goal 3 - Progress (Week 2): Progressing toward goal SLP Short Term Goal 4 (Week 2): Pt will attend/scan to left field of enviornment/body during functional tasks with Mod A verbal cues.  SLP Short Term Goal 4 - Progress (Week 2): Met    New Short Term Goals: Week 3: SLP Short Term Goal 1 (Week 3): Pt will sustain his attention to basic, familiar tasks for 10 minutes with mod assist verbal cues for redirection.   SLP Short Term Goal 2 (Week 3): Pt will utilize external aids to recall basic, daily information with min assist verbal cues.   SLP Short Term Goal 3 (Week 3): Pt will complete basic, familiar tasks with mod assist verbal cues for functional problem solving.   SLP Short Term Goal 4 (Week 3): Pt will attend/scan to left field of enviornment/body during functional tasks with Min A verbal cues.   Weekly Progress Updates:  Pt has made slow gains this reporting period and has met 3 out of 4 short term goals.  Pt currently requires mod-max assist for basic tasks due to significant cognitive deficits.  No family  has been present for education this reporting period.  As a result, pt would benefit from skilled ST while inpatient in order to maximize functional independence and reduce burden of care prior to discharge.  Anticipate that pt will need 24/7 care at discharge in addition to [place] follow up at next level of care.       Intensity: Minumum of 1-2 x/day, 30 to 90 minutes Frequency: 3 to 5 out of 7 days Duration/Length of Stay: 21 days  Treatment/Interventions: Cognitive remediation/compensation;Cueing hierarchy;Environmental controls;Functional tasks;Internal/external aids;Patient/family education;Therapeutic Activities   Daily Session  Skilled Therapeutic Interventions: Pt was seen for skilled ST targeting cognitive goals.  Pt needed mod assist verbal cues to utilize his daily schedule to recall names of therapists and activities of therapy sessions.  SLP utilized the [employer] to address task initiation, processing speed, and attention to tasks.  Pt had an average reaction time of ~1.5 seconds when locating targets on the light board for an accuracy of ~75%.  This decreased significantly with therapeutic use of distracting stimuli to less than 50% accuracy due to poor self regulation in the presence of distractions.  Therefore, therapist modified task to address selective attention with pt requiring max faded to min assist verbal cues to identify selected targets and ignore additional stimuli.  At the end of today's therapy session, pt presented with questions regarding prognosis and expectations for discharge.  SLP provided skilled education regarding  sequela of stroke recovery and realistic prognostic expectations for recovery given current limitations.  Pt verbalized understanding but will need ongoing education given the extent of his cognitive deficits.  Goals updated on this date to reflect current progress and plan of care.        Function:   Eating Eating   Modified Consistency Diet:  No Eating Assist Level: Set up assist for   Eating Set Up Assist For: Opening containers       Cognition Comprehension Comprehension assist level: Understands basic 90% of the time/cues < 10% of the time  Expression   Expression assist level: Expresses basic 90% of the time/requires cueing < 10% of the time.  Social Interaction Social Interaction assist level: Interacts appropriately 75 - 89% of the time - Needs redirection for appropriate language or to initiate interaction.  Problem Solving Problem solving assist level: Solves basic 25 - 49% of the time - needs direction more than half the time to initiate, plan or complete simple activities  Memory Memory assist level: Recognizes or recalls 50 - 74% of the time/requires cueing 25 - 49% of the time   General    Pain Pain Assessment Pain Assessment: No/denies pain  Therapy/Group: Individual Therapy  Aniko Finnigan, Selinda Orion 02/25/2017, 9:36 PM

## 2017-02-25 NOTE — Progress Notes (Signed)
Occupational Therapy Weekly Progress Note  Patient Details  Name: Frank Moss MRN: [id number] Date of Birth: [birth year]  Beginning of progress report period: February 12, 2017 End of progress report period: February 25, 2017  Today's Date: 02/25/2017 OT Individual Time: 0903-1000 OT Individual Time Calculation (min): 57 min    Patient has met 2 of 4 short term goals. Pt progress has been slower this week 2/2 painful L LE limiting pt's ability to participate in standing ADLs and transfers. Pt has demonstrated increased functional use of L UE, but continues to have difficulty crossing midline and integrate L UE into daily tasks.   Patient continues to demonstrate the following deficits: muscle weakness, impaired timing and sequencing, abnormal tone, unbalanced muscle activation, motor apraxia, ataxia, decreased coordination and decreased motor planning, decreased midline orientation, decreased attention to left, left side neglect and decreased motor planning, decreased initiation, decreased attention, decreased awareness, decreased problem solving, decreased safety awareness, decreased memory and delayed processing and decreased sitting balance, decreased standing balance, decreased postural control, hemiplegia and decreased balance strategies and therefore will continue to benefit from skilled OT intervention to enhance overall performance with BADL and Reduce care partner burden.  Patient progressing toward long term goals..  Continue plan of care.  OT Short Term Goals Week 2:  OT Short Term Goal 1 (Week 2): Pt will complete UB dressing with min assist to donn pullover shirt.  OT Short Term Goal 1 - Progress (Week 2): Met OT Short Term Goal 2 (Week 2): Pt will donn pull up pants with mod assist sit to stand.  OT Short Term Goal 2 - Progress (Week 2): Progressing toward goal OT Short Term Goal 3 (Week 2): Pt will initiate use of L UE during bathing tasks with min questioning cue 75% of tasks OT  Short Term Goal 3 - Progress (Week 2): Progressing toward goal OT Short Term Goal 4 (Week 2): Pt will locate 2/4 grooming items on L side of sink with min cues OT Short Term Goal 4 - Progress (Week 2): Met Week 3:  OT Short Term Goal 1 (Week 3): Pt will initiate use of L UE during bathing tasks with min questioning cue 75% of tasks OT Short Term Goal 2 (Week 3): Pt will donn pull up pants with mod assist sit to stand.  OT Short Term Goal 3 (Week 3): Pt will complete toilet transfer with consistent Mod A  Skilled Therapeutic Interventions/Progress Updates:    Pt greeted supine in bed after nurses finished cathing pt. Pt transferred sup<>sit with Mod A to advance LLE and min A to elevate trunk. Squat-pivot transfer to R with mod A and facilitation to initiate pivot. Bathing/dressing completed at the sink. Utilized CIMT techniques to force use of L UE. Pt able to reach and turn on hot water with increased time and tactile cues to initiate.  Sit<>stand with mod A and Max A to maintain standing balance while pt reached to wash buttocks and peri area 2/2 severe lean to L. Pt unable to tolerate firgure 4 position with LLE to wash feet/don socks. OT handed pt deodorant, and pt removed lid with B UEs, and initiated placing deordorant under R arm using L- this required increased time. UB dressing with hand-over hand to motor plan movement of L hand to place into shirt. Pt stopped after threading arms through shirt requiring questioning cues to continue task and pull shirt overhead and down back. Pt left seated in TIS wc with safety belt on  and needs met.   Therapy Documentation Precautions:  Precautions Precautions: Fall Precaution Comments: pusher to the left, apraxia, decreased initiation, left hemiparesis Restrictions Weight Bearing Restrictions: No Pain: Pain Assessment Pain Assessment: Faces Pain Score: 0-No pain Faces Pain Scale: Hurts little more Pain Type: Acute pain Pain Location: Leg Pain  Orientation: Left Pain Descriptors / Indicators: Cramping Pain Onset: With Activity Pain Intervention(s): Repositioned  See Function Navigator for Current Functional Status.   Therapy/Group: Individual Therapy  Valma Cava 02/25/2017, 12:30 PM

## 2017-02-25 NOTE — Progress Notes (Signed)
[place] PHYSICAL MEDICINE & REHABILITATION     PROGRESS NOTE  Subjective/Complaints:  No leg pain overnite , no c/ this am  ROS: Denies CP, SOB, nausea, vomiting, diarrhea.  Objective: Vital Signs: Blood pressure (!) 169/78, pulse 97, temperature 98.2 F (36.8 C), temperature source Oral, resp. rate 19, height 6\' 2"  (1.88 m), weight 102.8 kg (226 lb 9.9 oz), SpO2 94 %. No results found.  Recent Labs  02/23/17 0442  WBC 4.9  HGB 11.0*  HCT 32.7*  PLT 257    Recent Labs  02/23/17 0442  NA 137  K 4.0  CL 102  GLUCOSE 130*  BUN 21*  CREATININE 1.25*  CALCIUM 8.7*   CBG (last 3)   Recent Labs  02/24/17 1131 02/24/17 1634 02/24/17 2106  GLUCAP 109* 175* 168*    Wt Readings from Last 3 Encounters:  02/23/17 102.8 kg (226 lb 9.9 oz)  02/08/17 103.7 kg (228 lb 11.2 oz)  02/08/17 124.3 kg (274 lb)    Physical Exam:  BP (!) 169/78 (BP Location: Right Arm)   Pulse 97   Temp 98.2 F (36.8 C) (Oral)   Resp 19   Ht 6\' 2"  (1.88 m)   Wt 102.8 kg (226 lb 9.9 oz)   SpO2 94%   BMI 29.10 kg/m  Constitutional: He appears well-developedand well-nourished.  HENT: Normocephalicand atraumatic.  Eyes: EOMI. No discharge. Cardiovascular: RRR. No JVD. Respiratory: Effort normal breath sounds normal.  GI: Soft. Bowel sounds are normal.   Neurological: He is alertand oriented.  Left facial weakness with left gaze preference and right neglect, improving.  Delayed processing with slow speech. His attention is poor and he lacks awareness/insight into deficits. Spastic left hemiparesis  Motor: Left lower extremity: 2+/5 proximal to distal Skin: Skin is warmand dry.  Psychiatric: His affect is blunt. His speech is delayed. He is slowed. He expresses inappropriate judgment. He is inattentive.    Assessment/Plan: 1. 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 Physiatrist is providing close team supervision and 24 hour management of active medical problems listed below. Physiatrist and rehab team continue to assess barriers to discharge/monitor patient progress toward functional and medical goals.  Function:  Bathing Bathing position   Position: Shower  Bathing parts Body parts bathed by patient: Right arm, Left arm, Chest, Abdomen, Front perineal area, Right upper leg, Left upper leg Body parts bathed by helper: Right lower leg, Left lower leg, Buttocks  Bathing assist Assist Level: Touching or steadying assistance(Pt > 75%)      Upper Body Dressing/Undressing Upper body dressing   What is the patient wearing?: Pull over shirt/dress     Pull over shirt/dress - Perfomed by patient: Thread/unthread left sleeve, Put head through opening Pull over shirt/dress - Perfomed by helper: Pull shirt over trunk, Thread/unthread right sleeve        Upper body assist Assist Level: Touching or steadying assistance(Pt > 75%)      Lower Body Dressing/Undressing Lower body dressing   What is the patient wearing?: Pants, Non-skid slipper socks   Underwear - Performed by helper: Thread/unthread right underwear leg, Thread/unthread left underwear leg, Pull underwear up/down Pants- Performed by patient: Thread/unthread right pants leg Pants- Performed by helper: Thread/unthread left pants leg, Thread/unthread right pants leg, Pull pants up/down Non-skid slipper socks- Performed by patient: Don/doff right sock Non-skid slipper socks- Performed by helper: Don/doff left  sock       Shoes - Performed by helper: Don/doff right shoe, Don/doff left shoe, Fasten right, Fasten left          Lower body assist Assist for lower body dressing: Touching or steadying assistance (Pt > 75%)      Toileting Toileting     Toileting steps completed by helper: Adjust clothing prior to toileting, Performs perineal hygiene, Adjust  clothing after toileting Toileting Assistive Devices: Grab bar or rail  Toileting assist Assist level: Two helpers   Transfers Chair/bed transfer   Chair/bed transfer method: Squat pivot Chair/bed transfer assist level: 2 helpers Chair/bed transfer assistive device: Mechanical lift, Armrests Mechanical lift: Landscape architecttedy   Locomotion Ambulation Ambulation activity did not occur: Safety/medical concerns   Max distance: 5830ft Assist level: Maximal assist (Pt 25 - 49%)   Wheelchair Wheelchair activity did not occur: Safety/medical concerns Type: Manual      Cognition Comprehension Comprehension assist level: Understands basic 90% of the time/cues < 10% of the time  Expression Expression assist level: Expresses basic 90% of the time/requires cueing < 10% of the time.  Social Interaction Social Interaction assist level: Interacts appropriately 75 - 89% of the time - Needs redirection for appropriate language or to initiate interaction.  Problem Solving Problem solving assist level: Solves basic 25 - 49% of the time - needs direction more than half the time to initiate, plan or complete simple activities  Memory Memory assist level: Recognizes or recalls 50 - 74% of the time/requires cueing 25 - 49% of the time    Medical Problem List and Plan: 1.  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  DVT Prophylaxis/Anticoagulation: Pharmaceutical: Lovenox 3. H/o lumbago/Pain Management: Denies pain at this moment. Tylenol prn 4. Mood: LCSW to follow for evaluation and support.  5. Neuropsych: This patient is not capable of making decisions on his  own behalf. Trial ritalin Cont lexapro 6. Skin/Wound Care: routine pressure relief measures 7. Fluids/Electrolytes/Nutrition: Monitor I/Os--offer supplements if intake poor.   8.T2DM: monitor BS ac/hs. Continue lantus daily with  meal coverage. Will use SSI for elevated BS.    CBG (last 3) not being monitored will check ac TID, Increased lantus to 36U, am CBG improving, fair control 8/10  Recent Labs  02/24/17 1131 02/24/17 1634 02/24/17 2106  GLUCAP 109* 175* 168*    9. HTN:  Monitor BP bid  Norvasc 5mg  daily, increased to 7.5 on 7/28, will increase to 10mg  7/30- monitor for effect, systolic elevated change flomax to Hytrin 8/3 systolic  Elevated increase to 4mg  BID - monitor effect Vitals:   02/24/17 1300 02/25/17 0509  BP: (!) 170/69 (!) 169/78  Pulse: (!) 107 97  Resp: 18 19  Temp: 99.1 F (37.3 C) 98.2 F (36.8 C)  SpO2: 100% 94%   10 H/o urinary retention: currently incontinent. Toilet patient every 4 hours and cath if retention noted.   11. Hyperlipidemia: On atorvastatin.  12. Question CKD: baseline SCr around 1.6 per records review. Continue to monitor.  Offer fluids between meals for adequate hydration.   Cr. 1.30 on 7/28, 1.25 on 8/8  Cont to monitor 13. Hyponatremia  Sodium 134 on 7/28  Continue to monitor 14. Hypoalbuminemia  Supplement initiated 7/28 15. Acute blood loss anemia  Hemoglobin 12.4 on 7/28, 11.0 on 8/8  Continue to monitor 16.  Spasticity- Left  side , L hamstring most affected has daytime spasticity , Increase zanaflex to 2mg  BID and 4mg  Qhs,Botox 100 U to L hamstring on 8/5, expect some improvement around 8/12 17.  Urinary incont- occ retention now on Hytrin increase to 4mg  BID,   Urecholine 25mg QID  18.  Bowel incont  nl UA LOS (Days) 14 A FACE TO FACE EVALUATION WAS PERFORMED  Erick Colace 02/25/2017 6:47 AM

## 2017-02-25 NOTE — Progress Notes (Signed)
Physical Therapy Weekly Progress Note  Patient Details  Name: Frank Moss MRN: [id number] Date of Birth: 28-Sep-1970  Beginning of progress report period: February 18, 2017 End of progress report period: February 25, 2017  Today's Date: 02/25/2017 PT Individual Time: 1052-1203 PT Individual Time Calculation (min): 71 min   Patient has met 0 of 4 short term goals.  Pt has had delayed progress towards goals due to apraxia with L UE, severe tone in L LE, and severe tightness t/o trunk musculature.  Pt additionally verbalized increased fear of putting weight through L LE which has likely also limited progress towards goals.  Pt is now able to tolerate weight-bearing through L LE without reports of pain and able to stand without UE support, however, requires max assist to control L knee and shoe lift used under L foot to help compensate for increased tone.  Patient continues to demonstrate the following deficits muscle weakness and muscle joint tightness, impaired timing and sequencing, abnormal tone, unbalanced muscle activation, motor apraxia, decreased coordination and decreased motor planning, decreased midline orientation and decreased motor planning, decreased attention and decreased problem solving and decreased sitting balance, decreased standing balance, decreased postural control, hemiplegia and decreased balance strategies and therefore will continue to benefit from skilled PT intervention to increase functional independence with mobility.  Patient not progressing toward long term goals.  See goal revision..    PT Short Term Goals Week 3:  PT Short Term Goal 1 (Week 3): Pt will tolerate standing upright with weight bearing through L LE x 3 min without UE support to aid in ability to assist in ADL's. PT Short Term Goal 2 (Week 3): Pt will tolerate sitting in standard wheelchair without supervision x 3 hours. PT Short Term Goal 3 (Week 3): Pt will ambulate 5 ft with +1 assistance PT Short Term  Goal 4 (Week 3): W/C mobility to be initiated in standard w/c.  Skilled Therapeutic Interventions/Progress Updates:    Session today focused on improving trunk mobility and postural awareness with seated trunk rotation stretches while having pt reach across midline with R UE to match cards on tall mirror and write name on mirror.  Pt additionally performed numerous reps of sit to stand in parallel bars with manual facilitation of L knee extension and shoe lift under L heel.  Therapist additionally stretched L LE in knee extension and L ankle DF.  Pt and therapist additionally discussed pt performing lateral flexion stretch to the left to improve R sided trunk elongation and improved weight bearing through L side of pelvis during sitting.  Following session, pt left up in chair in room with lap belt in place and all needs met.  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  Therapy/Group: Individual Therapy  Brinlee Gambrell Hilario Quarry 02/25/2017, 12:30 PM

## 2017-02-25 NOTE — Plan of Care (Signed)
Problem: RH BLADDER ELIMINATION Goal: RH STG MANAGE BLADDER WITH ASSISTANCE STG Manage Bladder incontinence With minimal Assistance   Outcome: Not Progressing Total assistance

## 2017-02-26 ENCOUNTER — Inpatient Hospital Stay (HOSPITAL_COMMUNITY): Payer: 59 | Admitting: Speech Pathology

## 2017-02-26 ENCOUNTER — Inpatient Hospital Stay (HOSPITAL_COMMUNITY): Payer: 59 | Admitting: Occupational Therapy

## 2017-02-26 LAB — GLUCOSE, CAPILLARY
Glucose-Capillary: 73 mg/dL (ref 65–99)
Glucose-Capillary: 92 mg/dL (ref 65–99)
Glucose-Capillary: 74 mg/dL (ref 65–99)
Glucose-Capillary: 123 mg/dL — ABNORMAL HIGH (ref 65–99)

## 2017-02-26 NOTE — Progress Notes (Signed)
Speech Language Pathology Daily Session Note  Patient Details  Name: Frank Moss MRN: [id number] Date of Birth: [birth year]  Today's Date: 02/26/2017 SLP Individual Time: 0830-0900 SLP Individual Time Calculation (min): 30 min  Short Term Goals: Week 3: SLP Short Term Goal 1 (Week 3): Pt will sustain his attention to basic, familiar tasks for 10 minutes with mod assist verbal cues for redirection.   SLP Short Term Goal 2 (Week 3): Pt will utilize external aids to recall basic, daily information with min assist verbal cues.   SLP Short Term Goal 3 (Week 3): Pt will complete basic, familiar tasks with mod assist verbal cues for functional problem solving.   SLP Short Term Goal 4 (Week 3): Pt will attend/scan to left field of enviornment/body during functional tasks with Min A verbal cues.   Skilled Therapeutic Interventions: Skilled treatment session focused on cognition goals. SLP taught pt novel card game "battle" to target sustained attention, scanning to left of environment, task initiation and problem solving. Pt required Mod A for scanning, Mod A for task initiation, Mod A for problem solving and Mod A for sustained attention. Pt left upright in bed, bed alarm on and all needs within reach. Continue per current plan of care.      Function:  Eating Eating                 Cognition Comprehension Comprehension assist level: Understands basic 90% of the time/cues < 10% of the time  Expression   Expression assist level: Expresses basic 90% of the time/requires cueing < 10% of the time.  Social Interaction    Problem Solving Problem solving assist level: Solves basic 25 - 49% of the time - needs direction more than half the time to initiate, plan or complete simple activities  Memory Memory assist level: Recognizes or recalls 50 - 74% of the time/requires cueing 25 - 49% of the time    Pain    Therapy/Group: Individual Therapy  [name] 02/26/2017, 8:54 AM

## 2017-02-26 NOTE — Progress Notes (Signed)
Occupational Therapy Session Note  Patient Details  Name: Frank Moss MRN: [id number] Date of Birth: [birth year]  Today's Date: 02/26/2017 OT Individual Time: 1345-1449 OT Individual Time Calculation (min): 64 min   Skilled Therapeutic Interventions/Progress Updates:    Tx focus on balance, NMR, functional transfers, and cognitive remediation during self care tasks.   Pt greeted supine in bed, pants halfway down buttocks, still trying to lower them with bilateral UEs. Unable to verbalize purpose of action.   Tried squat pivot transfers to Lt and Rt with pt unable to sequence with multimodal cues and extra time once EOB. Pt c/o difficultly due to L LE pain. Pt achieved full stand with Max A in [place] and [facility]. Pt engaging in bathing/dressing w/c level. HOH assist for integrating L UE appropriately (pt still exhibiting alien hand tendencies). Pt self sequencing 50% of ADL, initiating drying himself off with towel after washing several UB areas. Max A sit<stand at sink, with facilitation of L knee extension due to buckling. Pt instructed to maintain balance with bilateral UE support on sink, while OT completed pericare, however, balance improved when pt was using UEs for perihygiene and clothing mgt himself (L UE unfastening clean brief or grab OT's shirt when unoccupied during stand). Bilateral integration while pt fastened pants in standing. Mod-max cues for attention and awareness throughout, pt washing face and then hovering wash cloth under faucet for 2 minutes. Unable to state purpose of action. At session exit pt was reclined in TIS, safety belt applied and all needs within reach.     Therapy Documentation Precautions:  Precautions Precautions: Fall Precaution Comments: pusher to the left, apraxia, decreased initiation, left hemiparesis Restrictions Weight Bearing Restrictions: No Vital Signs: Therapy Vitals Temp: 98.9 F (37.2 C) Temp Source: Oral Pulse  Rate: (!) 105 Resp: 18 BP: (!) 149/82 Patient Position (if appropriate): Sitting Oxygen Therapy SpO2: 99 % O2 Device: Not Delivered Pain: L LE pain, RN made aware      See Function Navigator for Current Functional Status.   Therapy/Group: Individual Therapy  Ronold Hardgrove A Cayson Kalb 02/26/2017, 3:59 PM

## 2017-02-26 NOTE — Progress Notes (Signed)
[place] PHYSICAL MEDICINE & REHABILITATION     PROGRESS NOTE  Subjective/Complaints:  Patient seen lying in bed this morning. He slept well overnight. He still sleepy this morning.  ROS: Denies CP, SOB, nausea, vomiting, diarrhea.  Objective: Vital Signs: Blood pressure (!) 144/68, pulse (!) 102, temperature 98.1 F (36.7 C), temperature source Oral, resp. rate 18, height 6\' 2"  (1.88 m), weight 102.8 kg (226 lb 9.9 oz), SpO2 100 %. No results found. No results for input(s): WBC, HGB, HCT, PLT in the last 72 hours. No results for input(s): NA, K, CL, GLUCOSE, BUN, CREATININE, CALCIUM in the last 72 hours.  Invalid input(s): CO CBG (last 3)   Recent Labs  02/25/17 1641 02/25/17 2102 02/26/17 0621  GLUCAP 171* 121* 73    Wt Readings from Last 3 Encounters:  02/23/17 102.8 kg (226 lb 9.9 oz)  02/08/17 103.7 kg (228 lb 11.2 oz)  02/08/17 124.3 kg (274 lb)    Physical Exam:  BP (!) 144/68 (BP Location: Right Arm)   Pulse (!) 102   Temp 98.1 F (36.7 C) (Oral)   Resp 18   Ht 6\' 2"  (1.88 m)   Wt 102.8 kg (226 lb 9.9 oz)   SpO2 100%   BMI 29.10 kg/m  Constitutional: He appears well-developedand well-nourished.  HENT: Normocephalicand atraumatic.  Eyes: EOMI. No discharge. Cardiovascular: RRR. No JVD. Respiratory: Effort normal breath sounds normal.  GI: Soft. Bowel sounds are normal.   Neurological: He is alertand oriented.  Left facial weakness with left gaze preference and right neglect, improving.  Delayed processing with slow speech.  Lacks awareness/insight into deficits. Spastic left hemiparesis  Motor: Left lower extremity: 2+/5 proximal to distal (limited participation) Skin: Skin is warmand dry.  Psychiatric: His affect is blunt. His speech is delayed. He is slowed. He expresses inappropriate judgment. He is inattentive.    Assessment/Plan: 1. 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 Physiatrist is providing close team supervision and 24 hour management of active medical problems listed below. Physiatrist and rehab team continue to assess barriers to discharge/monitor patient progress toward functional and medical goals.  Function:  Bathing Bathing position   Position: Shower  Bathing parts Body parts bathed by patient: Right arm, Left arm, Chest, Abdomen, Front perineal area, Right upper leg, Left upper leg Body parts bathed by helper: Right lower leg, Left lower leg, Buttocks  Bathing assist Assist Level: Touching or steadying assistance(Pt > 75%)      Upper Body Dressing/Undressing Upper body dressing   What is the patient wearing?: Pull over shirt/dress     Pull over shirt/dress - Perfomed by patient: Thread/unthread right sleeve, Put head through opening, Pull shirt over trunk Pull over shirt/dress - Perfomed by helper: Thread/unthread left sleeve        Upper body assist Assist Level: Touching or steadying assistance(Pt > 75%)      Lower Body Dressing/Undressing Lower body dressing   What is the patient wearing?: Non-skid slipper socks, Pants   Underwear - Performed by helper: Thread/unthread right underwear leg, Thread/unthread left underwear leg, Pull underwear up/down Pants- Performed by patient: Thread/unthread right pants leg Pants- Performed by helper: Thread/unthread left pants leg, Thread/unthread right pants leg, Pull pants up/down Non-skid slipper socks- Performed by patient: Don/doff right sock Non-skid slipper socks- Performed by helper: Don/doff left sock       Shoes - Performed  by helper: Don/doff right shoe, Don/doff left shoe, Fasten right, Fasten left          Lower body assist Assist for lower body dressing: Touching or steadying assistance (Pt > 75%)      Toileting Toileting     Toileting steps completed by helper: Adjust clothing prior to  toileting, Performs perineal hygiene, Adjust clothing after toileting Toileting Assistive Devices: Grab bar or rail  Toileting assist Assist level: Two helpers   Transfers Chair/bed transfer   Chair/bed transfer method: Squat pivot Chair/bed transfer assist level: 2 helpers Chair/bed transfer assistive device: Mechanical lift, Armrests Mechanical lift: Landscape architecttedy   Locomotion Ambulation Ambulation activity did not occur: Safety/medical concerns   Max distance: 6330ft Assist level: Maximal assist (Pt 25 - 49%)   Wheelchair Wheelchair activity did not occur: Safety/medical concerns Type: Manual      Cognition Comprehension Comprehension assist level: Understands basic 90% of the time/cues < 10% of the time  Expression Expression assist level: Expresses basic 90% of the time/requires cueing < 10% of the time.  Social Interaction Social Interaction assist level: Interacts appropriately 75 - 89% of the time - Needs redirection for appropriate language or to initiate interaction.  Problem Solving Problem solving assist level: Solves basic 25 - 49% of the time - needs direction more than half the time to initiate, plan or complete simple activities  Memory Memory assist level: Recognizes or recalls 50 - 74% of the time/requires cueing 25 - 49% of the time    Medical Problem List and Plan: 1.  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  DVT Prophylaxis/Anticoagulation: Pharmaceutical: Lovenox 3. H/o lumbago/Pain Management: Denies pain at this moment. Tylenol prn 4. Mood: LCSW to follow for evaluation and support.  5. Neuropsych: This patient is not capable of making decisions on his  own behalf. Trial ritalin Cont lexapro 6. Skin/Wound Care: routine pressure relief measures 7. Fluids/Electrolytes/Nutrition: Monitor I/Os--offer supplements if intake poor.   8.T2DM: monitor BS ac/hs. Continue  lantus daily with meal coverage. Will use SSI for elevated BS.    CBG (last 3)  Recent Labs  02/25/17 1641 02/25/17 2102 02/26/17 0621  GLUCAP 171* 121* 73   Increased lantus to 36U,   Labile, but overall controlled on 8/11 9. HTN:  Monitor BP bid  Norvasc 5mg  daily, increased to 7.5 on 7/28, increased to 10mg  7/30  Change flomax to Hytrin 8/3 increase to 4mg  BID on 8/9 Vitals:   02/25/17 2049 02/26/17 0434  BP: (!) 173/75 (!) 144/68  Pulse: 93 (!) 102  Resp:  18  Temp:  98.1 F (36.7 C)  SpO2:  100%   Extremely well, continue to monitor 10 H/o urinary retention: currently incontinent. Toilet patient every 4 hours and cath if retention noted.   11. Hyperlipidemia: On atorvastatin.  12. Question CKD: baseline SCr around 1.6 per records review. Continue to monitor.  Offer fluids between meals for adequate hydration.   Cr. 1.25 on 8/8  Cont to monitor 13. Hyponatremia: Resolved   Sodium 137 on 8/8  Continue to monitor 14. Hypoalbuminemia  Supplement initiated 7/28 15. Acute blood loss anemia  Hemoglobin 11.0 on 8/8    Continue to monitor 16.  Spasticity- Left side , L hamstring most affected has daytime spasticity , Increased zanaflex to 2mg  BID and 4mg  Qhs,Botox 100 U to L hamstring on 8/5, expect some improvement around 8/12 17.  Urinary incont- occ retention now on Hytrin increase to 4mg  BID,     Urecholine 25mg QID  18.  Bowel incont   LOS (Days) 15 A FACE TO FACE EVALUATION WAS PERFORMED  Waldo Damian Karis Juba 02/26/2017 7:07 AM

## 2017-02-26 NOTE — Plan of Care (Signed)
Problem: RH BOWEL ELIMINATION Goal: RH STG MANAGE BOWEL WITH ASSISTANCE STG Manage Bowel with minimal [facility].   Outcome: Not Progressing Incont per report  Problem: RH BLADDER ELIMINATION Goal: RH STG MANAGE BLADDER WITH ASSISTANCE STG Manage Bladder incontinence With minimal Assistance   Outcome: Not Progressing Requires I/O cath

## 2017-02-27 ENCOUNTER — Inpatient Hospital Stay (HOSPITAL_COMMUNITY): Payer: 59 | Admitting: Occupational Therapy

## 2017-02-27 DIAGNOSIS — R0989 Other specified symptoms and signs involving the circulatory and respiratory systems: Secondary | ICD-10-CM

## 2017-02-27 DIAGNOSIS — R7309 Other abnormal glucose: Secondary | ICD-10-CM

## 2017-02-27 LAB — GLUCOSE, CAPILLARY
GLUCOSE-CAPILLARY: 75 mg/dL (ref 65–99)
GLUCOSE-CAPILLARY: 93 mg/dL (ref 65–99)
Glucose-Capillary: 198 mg/dL — ABNORMAL HIGH (ref 65–99)
Glucose-Capillary: 164 mg/dL — ABNORMAL HIGH (ref 65–99)

## 2017-02-27 NOTE — Progress Notes (Signed)
[place] PHYSICAL MEDICINE & REHABILITATION     PROGRESS NOTE  Subjective/Complaints:  Patient seen lying in bed this morning. He states he slept well overnight. He denies complaints.  ROS: Denies CP, SOB, nausea, vomiting, diarrhea.  Objective: Vital Signs: Blood pressure (!) 158/80, pulse 98, temperature 98.5 F (36.9 C), temperature source Oral, resp. rate 18, height 6\' 2"  (1.88 m), weight 102.8 kg (226 lb 9.9 oz), SpO2 100 %. No results found. No results for input(s): WBC, HGB, HCT, PLT in the last 72 hours. No results for input(s): NA, K, CL, GLUCOSE, BUN, CREATININE, CALCIUM in the last 72 hours.  Invalid input(s): CO CBG (last 3)   Recent Labs  02/26/17 1645 02/26/17 2045 02/27/17 0644  GLUCAP 74 123* 75    Wt Readings from Last 3 Encounters:  02/23/17 102.8 kg (226 lb 9.9 oz)  02/08/17 103.7 kg (228 lb 11.2 oz)  02/08/17 124.3 kg (274 lb)    Physical Exam:  BP (!) 158/80 (BP Location: Right Arm)   Pulse 98   Temp 98.5 F (36.9 C) (Oral)   Resp 18   Ht 6\' 2"  (1.88 m)   Wt 102.8 kg (226 lb 9.9 oz)   SpO2 100%   BMI 29.10 kg/m  Constitutional: He appears well-developedand well-nourished.  HENT: Normocephalicand atraumatic.  Eyes: EOMI. No discharge. Cardiovascular: RRR. No JVD. Respiratory: Effort normal breath sounds normal.  GI: Soft. Bowel sounds are normal.   Neurological: He is alertand oriented.  Left facial weakness with left gaze preference and right neglect, improving.  Delayed processing with slow speech.  Lacks awareness/insight into deficits. Spastic left hemiparesis  Motor: Left lower extremity: 2+/5 proximal to distal  Skin: Skin is warmand dry.  Psychiatric: His affect is blunt. His speech is delayed. He is slowed. He expresses inappropriate judgment. He is inattentive.    Assessment/Plan: 1. 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 Physiatrist is providing close team supervision and 24 hour management of active medical problems listed below. Physiatrist and rehab team continue to assess barriers to discharge/monitor patient progress toward functional and medical goals.  Function:  Bathing Bathing position   Position: Wheelchair/chair at sink  Bathing parts Body parts bathed by patient: Right arm, Left arm, Chest, Abdomen, Front perineal area, Right upper leg, Left upper leg, Right lower leg Body parts bathed by helper: Left lower leg, Back, Buttocks  Bathing assist Assist Level: Touching or steadying assistance(Pt > 75%)      Upper Body Dressing/Undressing Upper body dressing   What is the patient wearing?: Pull over shirt/dress     Pull over shirt/dress - Perfomed by patient: Thread/unthread right sleeve, Put head through opening, Pull shirt over trunk Pull over shirt/dress - Perfomed by helper: Thread/unthread left sleeve        Upper body assist Assist Level: Touching or steadying assistance(Pt > 75%)      Lower Body Dressing/Undressing Lower body dressing   What is the patient wearing?: Non-skid slipper socks, Pants   Underwear - Performed by helper: Thread/unthread right underwear leg, Thread/unthread left underwear leg, Pull underwear up/down Pants- Performed by patient: Thread/unthread right pants leg Pants- Performed by helper: Thread/unthread left pants leg, Pull pants up/down Non-skid slipper socks- Performed by patient: Don/doff right sock Non-skid slipper socks- Performed by helper: Don/doff left sock       Shoes - Performed by helper: Don/doff right  shoe, Don/doff left shoe, Fasten right, Fasten left          Lower body assist Assist for lower body dressing: Touching or steadying assistance (Pt > 75%)      Toileting Toileting     Toileting steps completed by helper: Performs perineal hygiene, Adjust clothing prior to toileting, Adjust  clothing after toileting Toileting Assistive Devices: Grab bar or rail  Toileting assist Assist level: Two helpers   Transfers Chair/bed transfer   Chair/bed transfer method: Stand pivot, Other Chair/bed transfer assist level: 2 helpers Chair/bed transfer assistive device: Mechanical lift Mechanical lift: Landscape architecttedy   Locomotion Ambulation Ambulation activity did not occur: Safety/medical concerns   Max distance: 5930ft Assist level: Maximal assist (Pt 25 - 49%)   Wheelchair Wheelchair activity did not occur: Safety/medical concerns Type: Manual      Cognition Comprehension Comprehension assist level: Understands basic 90% of the time/cues < 10% of the time  Expression Expression assist level: Expresses basic 90% of the time/requires cueing < 10% of the time.  Social Interaction Social Interaction assist level: Interacts appropriately 75 - 89% of the time - Needs redirection for appropriate language or to initiate interaction.  Problem Solving Problem solving assist level: Solves basic 25 - 49% of the time - needs direction more than half the time to initiate, plan or complete simple activities  Memory Memory assist level: Recognizes or recalls 50 - 74% of the time/requires cueing 25 - 49% of the time    Medical Problem List and Plan: 1.  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  DVT Prophylaxis/Anticoagulation: Pharmaceutical: Lovenox 3. H/o lumbago/Pain Management: Denies pain at this moment. Tylenol prn 4. Mood: LCSW to follow for evaluation and support.  5. Neuropsych: This patient is not capable of making decisions on his  own behalf. Trial ritalin Cont lexapro 6. Skin/Wound Care: routine pressure relief measures 7. Fluids/Electrolytes/Nutrition: Monitor I/Os--offer supplements if intake poor.   8.T2DM: monitor BS ac/hs. Continue lantus daily with meal coverage. Will use SSI for  elevated BS.    CBG (last 3)   Recent Labs  02/26/17 1645 02/26/17 2045 02/27/17 0644  GLUCAP 74 123* 75   Increased lantus to 36U,   Labile, but overall controlled on 8/12 9. HTN:  Monitor BP bid  Norvasc 5mg  daily, increased to 7.5 on 7/28, increased to 10mg  7/30  Changed flomax to Hytrin 8/3 increase to 4mg  BID on 8/9 Vitals:   02/26/17 1948 02/27/17 0502  BP: (!) 166/69 (!) 158/80  Pulse:  98  Resp:  18  Temp:  98.5 F (36.9 C)  SpO2:  100%   Remains labile, will consider further med changes tomorrow 10 H/o urinary retention: currently incontinent. Toilet patient every 4 hours and cath if retention noted.   11. Hyperlipidemia: On atorvastatin.  12. Question CKD: baseline SCr around 1.6 per records review. Continue to monitor.  Offer fluids between meals for adequate hydration.   Cr. 1.25 on 8/8  Cont to monitor 13. Hyponatremia: Resolved   Sodium 137 on 8/8  Continue to monitor 14. Hypoalbuminemia  Supplement initiated 7/28 15. Acute blood loss anemia  Hemoglobin 11.0 on 8/8    Continue to monitor 16.  Spasticity- Left side , L hamstring most affected has daytime spasticity , Increased zanaflex to 2mg  BID and 4mg  Qhs,Botox 100 U to L hamstring on 8/5, expect some improvement around 8/12 17.  Urinary  incont- occ retention now on Hytrin increase to 4mg  BID,     Urecholine 25mg QID  18.  Bowel incont   LOS (Days) 16 A FACE TO FACE EVALUATION WAS PERFORMED  Frank Moss 02/27/2017 6:59 AM

## 2017-02-27 NOTE — Plan of Care (Signed)
Problem: RH BOWEL ELIMINATION Goal: RH STG MANAGE BOWEL WITH ASSISTANCE STG [facility]   Outcome: Not Progressing Incontinent   Problem: RH BLADDER ELIMINATION Goal: RH STG MANAGE BLADDER WITH ASSISTANCE STG Manage Bladder incontinence With minimal Assistance   Outcome: Not Progressing Requiring I/O cath

## 2017-02-27 NOTE — Progress Notes (Signed)
Occupational Therapy Session Note  Patient Details  Name: Frank Moss MRN: [id number] Date of Birth: 1970-11-25  Today's Date: 02/27/2017 OT Individual Time: [id number] OT Individual Time Calculation (min): 45 min   Skilled Therapeutic Interventions/Progress Updates:    Tx focus on bilateral integration, initiation, endurance, and awareness during functional tasks.   Pt greeted in w/c, already bathed/dressed. Pt escorted to gym, where he engaged in free throw shooting with short basketball hoop. Pt requiring mod multimodal cues to incorporate L UE appropriately, exhibiting increased initiation when passed basketball. Pt requiring min cuing for accurate counting of successful shots (for memory challenges). Pt participating in chest passes with OT, increased L UE use with decreased reaction time between passes. Had him use bilateral UEs to fasten buttons, velcro, and zip fabric for L NMR. Pt requiring extra time to motor plan and self organize. Pt able to complete tasks with min cuing for attention. At end of tx pt was returned to room. He was tilted in TIS, safety belt applied, and all needs within reach at time of departure.   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   Therapy/Group: Individual Therapy  Mirinda Monte A [name] 02/27/2017, 3:59 PM

## 2017-02-28 ENCOUNTER — Inpatient Hospital Stay (HOSPITAL_COMMUNITY): Payer: 59 | Admitting: Physical Therapy

## 2017-02-28 ENCOUNTER — Inpatient Hospital Stay (HOSPITAL_COMMUNITY): Payer: 59 | Admitting: Occupational Therapy

## 2017-02-28 ENCOUNTER — Inpatient Hospital Stay (HOSPITAL_COMMUNITY): Payer: 59

## 2017-02-28 ENCOUNTER — Inpatient Hospital Stay (HOSPITAL_COMMUNITY): Payer: 59 | Admitting: Speech Pathology

## 2017-02-28 LAB — GLUCOSE, CAPILLARY
GLUCOSE-CAPILLARY: 111 mg/dL — AB (ref 65–99)
GLUCOSE-CAPILLARY: 192 mg/dL — AB (ref 65–99)
Glucose-Capillary: 176 mg/dL — ABNORMAL HIGH (ref 65–99)
Glucose-Capillary: 196 mg/dL — ABNORMAL HIGH (ref 65–99)

## 2017-02-28 MED ORDER — HYDRALAZINE HCL 10 MG PO TABS
10.0000 mg | ORAL_TABLET | Freq: Three times a day (TID) | ORAL | Status: DC
  Administered 2017-02-28 – 2017-03-01 (×4): 10 mg via ORAL
  Filled 2017-02-28 (×4): qty 1

## 2017-02-28 MED ORDER — ASPIRIN 81 MG PO CHEW
324.0000 mg | CHEWABLE_TABLET | Freq: Every day | ORAL | Status: DC
  Administered 2017-02-28 – 2017-03-11 (×12): 324 mg via ORAL
  Filled 2017-02-28 (×12): qty 4

## 2017-02-28 NOTE — Progress Notes (Signed)
Occupational Therapy Session Note  Patient Details  Name: Frank Moss MRN: [id number] Date of Birth: [birth year]  Today's Date: 02/28/2017 OT Individual Time: 1405-1430 OT Individual Time Calculation (min): 25 min    Short Term Goals: Week 3:  OT Short Term Goal 1 (Week 3): Pt will initiate use of L UE during bathing tasks with min questioning cue 75% of tasks OT Short Term Goal 2 (Week 3): Pt will donn pull up pants with mod assist sit to stand.  OT Short Term Goal 3 (Week 3): Pt will complete toilet transfer with consistent Mod A  Skilled Therapeutic Interventions/Progress Updates:    Treatment session with focus on LUE NMR.  Pt received in tilt-in-space w/c reporting no pain.  Engaged in LUE NMR in sitting with focus on motor control and purposeful movements with LUE.  Utilized numbered cups with focus on motor control when reaching for specific numbered cups and release of cups.  Increased challenge to incorporating addition with pt selecting 2 to 3 cups to add up to specific given total.  Pt would require intermittent hand over hand assist to pick up cups with LUE and cues to attempt with LUE prior to assisting.  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   Therapy/Group: Individual Therapy  Rosalio LoudHOXIE, Dorthy Magnussen 02/28/2017, 3:06 PM

## 2017-02-28 NOTE — Progress Notes (Signed)
This RN observed pt sitting in room in tilt-in-space w/c and noticed that the back of the wheelchair had come loose and patient was not positioned properly. NT Lauryn and RN Melanie assisted this RN with transferring pt using STEDY from w/c to bed. Pt with no injuries assessed. Pt with no complaints of pain. W/C tagged for therapy to assess.

## 2017-02-28 NOTE — Progress Notes (Signed)
Speech Language Pathology Daily Session Note  Patient Details  Name: Frank Moss MRN: [id number] Date of Birth: May 21, 1971  Today's Date: 02/28/2017 SLP Individual Time: 0715-0810 SLP Individual Time Calculation (min): 55 min  Short Term Goals: Week 3: SLP Short Term Goal 1 (Week 3): Pt will sustain his attention to basic, familiar tasks for 10 minutes with mod assist verbal cues for redirection.   SLP Short Term Goal 2 (Week 3): Pt will utilize external aids to recall basic, daily information with min assist verbal cues.   SLP Short Term Goal 3 (Week 3): Pt will complete basic, familiar tasks with mod assist verbal cues for functional problem solving.   SLP Short Term Goal 4 (Week 3): Pt will attend/scan to left field of enviornment/body during functional tasks with Min A verbal cues.   Skilled Therapeutic Interventions: Skilled treatment session focused on cognitive goals. SLP facilitated session by providing Max A verbal cues for utilization of his LUE during tray set-up. However, patient located items on tray within his left visual field with supervision verbal cues. Patient demonstrated sustained attention to self-feeding for ~10 minutes with supervision verbal cues. Patient also utilized his schedule to anticipate upcoming therapy sessions with Mod A verbal cues and required Mod-Max A verbal cues to recall events/goals of previous therapy sessions. Patient left upright in bed with alarm on and all needs within reach. Continue with current plan of care.      Function:  Eating Eating   Modified Consistency Diet: No Eating Assist Level: Set up assist for   Eating Set Up Assist For: Opening containers       Cognition Comprehension Comprehension assist level: Understands basic 90% of the time/cues < 10% of the time  Expression   Expression assist level: Expresses basic 90% of the time/requires cueing < 10% of the time.  Social Interaction Social Interaction assist level: Interacts  appropriately 75 - 89% of the time - Needs redirection for appropriate language or to initiate interaction.  Problem Solving Problem solving assist level: Solves basic 25 - 49% of the time - needs direction more than half the time to initiate, plan or complete simple activities  Memory Memory assist level: Recognizes or recalls 25 - 49% of the time/requires cueing 50 - 75% of the time    Pain Pain in left leg, RN notified and administered medications   Therapy/Group: Individual Therapy  Cipriana Biller 02/28/2017, 8:13 AM

## 2017-02-28 NOTE — Progress Notes (Signed)
Physical Therapy Session Note  Patient Details  Name: Frank GeneralRaymond Sahagian MRN: [id number] Date of Birth: 1970/09/10  Today's Date: 02/28/2017 PT Individual Time: 1300-1400 PT Individual Time Calculation (min): 60 min   Short Term Goals: Week 3:  PT Short Term Goal 1 (Week 3): Pt will tolerate standing upright with weight bearing through L LE x 3 min without UE support to aid in ability to assist in ADL's. PT Short Term Goal 2 (Week 3): Pt will tolerate sitting in standard wheelchair without supervision x 3 hours. PT Short Term Goal 3 (Week 3): Pt will ambulate 5 ft with +1 assistance PT Short Term Goal 4 (Week 3): W/C mobility to be initiated in standard w/c.  Skilled Therapeutic Interventions/Progress Updates: Pt received seated in w/c, denies pain at rest however does c/o pain L posterior knee during standing activities and therapist applied bengay to back of knee per pt request. Gait 4 trials x15' each with R rail in hall, maxA for LLE progression and stance control, +2 for w/c follow for safety. Pt with inconsistent L quad activation during gait, improved with cues for upright posture and when L foot placed in midline alignment. Standing RLE toe taps to 6" step with RUE on rail, maxA for LLE stance control and balance with L lateral lean/mild pushing. Pt with increased pain in posterior L knee with this activity; suspect d/t stretching in terminal knee extension at gastroc and hamstring. Tall kneeling x5 min with variable min guard>maxA d/t L lateral lean; cues for neutral trunk rotation and upright posture, tactile cueing at L glute for hip extension in stance. Returned to room totalA; remained semi-reclined in w/c with quick release belt intact and all needs in reach.      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   Therapy/Group: Individual Therapy  Vista Lawmanlizabeth J Tygielski 02/28/2017, 2:07 PM

## 2017-02-28 NOTE — Progress Notes (Addendum)
Occupational Therapy Session Note  Patient Details  Name: Frank Moss MRN: [id number] Date of Birth: 30-Oct-1970  Today's Date: 02/28/2017 OT Individual Time: 1000-1100 OT Individual Time Calculation (min): 60 min   Skilled Therapeutic Interventions/Progress Updates:    Tx focus on L UE NMR, balance, and cognitive remediation during self care tasks.   Pt greeted supine in bed, agreeable to tx. Pt transitioning to EOB with Mod A for correcting posterior LOBs. Cues to remove L UE grip on bedrails on Lt side. He completed squat pivot<TIS towards Rt side, with Mod A for initiation. He completed bathing/dressing w/c level at sink. HOH assist/tactile cues for bilaterally integrating L UE 90% of time. Pt initiating washing Rt arm with Lt UE today. Mod cues for sequencing washing of LB areas. Assist provided for bathing/dressing L LE due to pain. Pt utilizing figure 4 for donning Rt footwear with verbal instruction, 1 forward LOB with Min A from OT to correct. Sit<stand with Mod A, with pt assisting with pericare/clothing mgt. Cues for midline orientation due to mod pushing tendencies. At end of tx pt was reclined in TIS, safety belt donned, and all needs within reach.   Pt still sensitive to environmental distractions. OT stood out of mirror sight to decrease visual distractions with pt appearing to improve attention to tasks.    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   Therapy/Group: Individual Therapy  Larena Ohnemus A Geraldyne Barraclough 02/28/2017, 12:30 PM

## 2017-02-28 NOTE — Progress Notes (Signed)
[facility] RE[facility]ILITATION     PROGRESS NOTE  Subjective/Complaints:  Pt seen laying in bed this AM.  He slept well overnight.  He is attempting to use urinal, but still having difficulty voiding.  Also complaints of left posterior knee pain.   ROS: +Left posterior knee pain. Denies CP, SOB, nausea, vomiting, diarrhea.  Objective: Vital Signs: Blood pressure (!) 165/90, pulse 90, temperature 98.4 F (36.9 C), temperature source Oral, resp. rate 18, height 6\' 2"  (1.88 m), weight 102.8 kg (226 lb 9.9 oz), SpO2 99 %. No results found. No results for input(s): WBC, HGB, HCT, PLT in the last 72 hours. No results for input(s): NA, K, CL, GLUCOSE, BUN, CREATININE, CALCIUM in the last 72 hours.  Invalid input(s): CO CBG (last 3)   Recent Labs  02/27/17 1617 02/27/17 2037 02/28/17 0640  GLUCAP 198* 164* 111*    Wt Readings from Last 3 Encounters:  02/23/17 102.8 kg (226 lb 9.9 oz)  02/08/17 103.7 kg (228 lb 11.2 oz)  02/08/17 124.3 kg (274 lb)    Physical Exam:  BP (!) 165/90 (BP Location: Right Arm)   Pulse 90   Temp 98.4 F (36.9 C) (Oral)   Resp 18   Ht 6\' 2"  (1.88 m)   Wt 102.8 kg (226 lb 9.9 oz)   SpO2 99%   BMI 29.10 kg/m  Constitutional: He appears well-developedand well-nourished.  HENT: Normocephalicand atraumatic.  Eyes: EOMI. No discharge. Cardiovascular: RRR. No JVD. Respiratory: Effort normal breath sounds normal.  GI: Soft. Bowel sounds are normal.   Neurological: He is alertand oriented.  Left facial weakness with left gaze preference and right neglect, improving.  Delayed processing with slow speech.  Lacks awareness/insight into deficits. Spastic left hemiparesis  Motor: Left lower extremity: 2/5 proximal to distal  Skin: Skin is warmand dry.  Psychiatric: His affect is blunt. His speech is delayed. He is slowed. He expresses inappropriate judgment. He is inattentive.    Assessment/Plan: 1. Functional deficits secondary to  right ACA infarct, small right thalamic infarct which require 3+ hours per day of interdisciplinary therapy in a comprehensive inpatient rehab setting. Physiatrist is providing close team supervision and 24 hour management of active medical problems listed below. Physiatrist and rehab team continue to assess barriers to discharge/monitor patient progress toward functional and medical goals.  Function:  Bathing Bathing position   Position: Wheelchair/chair at sink  Bathing parts Body parts bathed by patient: Right arm, Left arm, Abdomen, Chest, Front perineal area, Right upper leg, Left upper leg Body parts bathed by helper: Buttocks, Right lower leg, Left lower leg, Back  Bathing assist Assist Level: Touching or steadying assistance(Pt > 75%)      Upper Body Dressing/Undressing Upper body dressing   What is the patient wearing?: Pull over shirt/dress     Pull over shirt/dress - Perfomed by patient: Thread/unthread right sleeve, Put head through opening, Pull shirt over trunk Pull over shirt/dress - Perfomed by helper: Thread/unthread left sleeve        Upper body assist Assist Level: Touching or steadying assistance(Pt > 75%)      Lower Body Dressing/Undressing Lower body dressing   What is the patient wearing?: Non-skid slipper socks, Pants   Underwear - Performed by helper: Thread/unthread right underwear leg, Thread/unthread left underwear leg, Pull underwear up/down Pants- Performed by patient: Thread/unthread right pants leg Pants- Performed by helper: Thread/unthread right pants leg, Thread/unthread left pants leg, Pull pants up/down Non-skid slipper socks- Performed by patient:  Don/doff right sock Non-skid slipper socks- Performed by helper: Don/doff right sock, Don/doff left sock       Shoes - Performed by helper: Don/doff right shoe, Don/doff left shoe, Fasten right, Fasten left          Lower body assist Assist for lower body dressing: 2 Helpers       Toileting Toileting     Toileting steps completed by helper: Performs perineal hygiene, Adjust clothing prior to toileting, Adjust clothing after toileting Toileting Assistive Devices: Grab bar or rail  Toileting assist Assist level: Two helpers   Transfers Chair/bed transfer   Chair/bed transfer method: Stand pivot, Other Chair/bed transfer assist level: 2 helpers Chair/bed transfer assistive device: Mechanical lift Mechanical lift: Landscape architect Ambulation activity did not occur: Safety/medical concerns   Max distance: 26ft Assist level: Maximal assist (Pt 25 - 49%)   Wheelchair Wheelchair activity did not occur: Safety/medical concerns Type: Manual      Cognition Comprehension Comprehension assist level: Understands basic 90% of the time/cues < 10% of the time  Expression Expression assist level: Expresses basic 90% of the time/requires cueing < 10% of the time.  Social Interaction Social Interaction assist level: Interacts appropriately 75 - 89% of the time - Needs redirection for appropriate language or to initiate interaction.  Problem Solving Problem solving assist level: Solves basic 25 - 49% of the time - needs direction more than half the time to initiate, plan or complete simple activities  Memory Memory assist level: Recognizes or recalls 25 - 49% of the time/requires cueing 50 - 75% of the time    Medical Problem List and Plan: 1.  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  DVT Prophylaxis/Anticoagulation: Pharmaceutical: Lovenox 3. H/o lumbago/Pain Management: Denies pain at this moment. Tylenol prn 4. Mood: LCSW to follow for evaluation and support.  5. Neuropsych: This patient is not capable of making decisions on his  own behalf. Trial ritalin Cont lexapro 6. Skin/Wound Care: routine pressure relief measures 7. Fluids/Electrolytes/Nutrition:  Monitor I/Os--offer supplements if intake poor.   8.T2DM: monitor BS ac/hs. Continue lantus daily with meal coverage. Will use SSI for elevated BS.    CBG (last 3)   Recent Labs  02/27/17 1617 02/27/17 2037 02/28/17 0640  GLUCAP 198* 164* 111*   Increased lantus to 36U,   Overall controlled on 8/13 9. HTN:  Monitor BP bid  Norvasc 5mg  daily, increased to 7.5 on 7/28, increased to 10mg  7/30  Changed flomax to Hytrin 8/3 increase to 4mg  BID on 8/9  Hydralazine 10 started 8/13 Vitals:   02/27/17 2126 02/28/17 0547  BP: (!) 168/94 (!) 165/90  Pulse:  90  Resp: 18 18  Temp:  98.4 F (36.9 C)  SpO2: 98% 99%   10 H/o urinary retention: currently incontinent. Toilet patient every 4 hours and cath if retention noted.   11. Hyperlipidemia: On atorvastatin.  12. Question CKD: baseline SCr around 1.6 per records review. Continue to monitor.  Offer fluids between meals for adequate hydration.   Cr. 1.25 on 8/8  Cont to monitor 13. Hyponatremia: Resolved   Sodium 137 on 8/8  Continue to monitor 14. Hypoalbuminemia  Supplement initiated 7/28 15. Acute blood loss anemia  Hemoglobin 11.0 on 8/8   Continue to monitor 16.  Spasticity- Left side , L hamstring most affected has daytime spasticity , Increased zanaflex to 2mg  BID and  4mg  Qhs,Botox 100 U to L hamstring on 8/5 17.  Urinary incont- occ retention now on Hytrin increase to 4mg  BID,     Urecholine 25mg QID  18.  Bowel incont   LOS (Days) 17 A FACE TO FACE EVALUATION WAS PERFORMED  Ankit Karis Juba 02/28/2017 8:04 AM

## 2017-02-28 NOTE — Plan of Care (Signed)
Problem: RH BLADDER ELIMINATION Goal: RH STG MANAGE BLADDER WITH ASSISTANCE STG Manage Bladder incontinence With minimal Assistance   Outcome: Not Progressing Client still unable to urinate. Bladder scanning and I&O catheterization still required at this time.  Problem: RH KNOWLEDGE DEFICIT Goal: RH STG INCREASE KNOWLEDGE OF DIABETES Patient and family will demonstrate increased knowledge of diabetes and associated medications, s/s of hypo/hyperglycemia, dietary restrictions, and follow-up with the MD post discharge with resources/reminders.   Outcome: Not Progressing Pt and family continue to require reinforcement of education provided. Pt with hx of non-compliance at home. Goal: RH STG INCREASE KNOWLEDGE OF HYPERTENSION Patient and family with demonstrate knowledge of HTN medications and dietary restrictions at discharge with reminders and resources.   Outcome: Not Progressing Pt and family continue to require reinforcement of education provided. Pt with hx of non-compliance at home.

## 2017-03-01 ENCOUNTER — Inpatient Hospital Stay (HOSPITAL_COMMUNITY): Payer: 59 | Admitting: Physical Therapy

## 2017-03-01 ENCOUNTER — Inpatient Hospital Stay (HOSPITAL_COMMUNITY): Payer: 59 | Admitting: Speech Pathology

## 2017-03-01 ENCOUNTER — Inpatient Hospital Stay (HOSPITAL_COMMUNITY): Payer: 59 | Admitting: Occupational Therapy

## 2017-03-01 LAB — GLUCOSE, CAPILLARY
GLUCOSE-CAPILLARY: 172 mg/dL — AB (ref 65–99)
GLUCOSE-CAPILLARY: 149 mg/dL — AB (ref 65–99)
Glucose-Capillary: 89 mg/dL (ref 65–99)
Glucose-Capillary: 137 mg/dL — ABNORMAL HIGH (ref 65–99)

## 2017-03-01 MED ORDER — BETHANECHOL CHLORIDE 25 MG PO TABS
50.0000 mg | ORAL_TABLET | Freq: Four times a day (QID) | ORAL | Status: DC
  Administered 2017-03-01 – 2017-03-03 (×11): 50 mg via ORAL
  Filled 2017-03-01 (×11): qty 2

## 2017-03-01 MED ORDER — HYDRALAZINE HCL 25 MG PO TABS
25.0000 mg | ORAL_TABLET | Freq: Three times a day (TID) | ORAL | Status: DC
  Administered 2017-03-01 – 2017-03-04 (×9): 25 mg via ORAL
  Filled 2017-03-01 (×9): qty 1

## 2017-03-01 NOTE — Plan of Care (Signed)
Problem: RH KNOWLEDGE DEFICIT Goal: RH STG INCREASE KNOWLEDGE OF DIABETES Patient and family will demonstrate increased knowledge of diabetes and associated medications, s/s of hypo/hyperglycemia, dietary restrictions, and follow-up with the MD post discharge with resources/reminders.   Outcome: Not Progressing Pt does not demonstrate motivation to learn, unclear how much pt understands requires cue/prompting for answers. Family visiting inconsistent unable to assess education.

## 2017-03-01 NOTE — Progress Notes (Signed)
Speech Language Pathology Daily Session Note  Patient Details  Name: Frank Moss MRN: [id number] Date of Birth: Jun 22, 1971  Today's Date: 03/01/2017 SLP Individual Time: [id number] SLP Individual Time Calculation (min): 45 min  Short Term Goals: Week 3: SLP Short Term Goal 1 (Week 3): Pt will sustain his attention to basic, familiar tasks for 10 minutes with mod assist verbal cues for redirection.   SLP Short Term Goal 2 (Week 3): Pt will utilize external aids to recall basic, daily information with min assist verbal cues.   SLP Short Term Goal 3 (Week 3): Pt will complete basic, familiar tasks with mod assist verbal cues for functional problem solving.   SLP Short Term Goal 4 (Week 3): Pt will attend/scan to left field of enviornment/body during functional tasks with Min A verbal cues.   Skilled Therapeutic Interventions: Skilled treatment session focused on cognition goals. SLP facilitated session by providing Max A to Mod A multimodal cues (verbal and tactile) to follow 1 step directions for bed mobility and to facilitate tray set-up for breakfast. Pt required Mod A to scan of left of tray to locate items. Pt minimally engaged with SLP and provided verbal responses only when spoken too. Pt required Max A to Mod A to problem solve tray set up and bed mobility tasks. Pt was left upright in bed, bed alarm on and all needs within reach. Continue per current plan of care.      Function:  Eating Eating   Modified Consistency Diet: No Eating Assist Level: Set up assist for   Eating Set Up Assist For: Opening containers       Cognition Comprehension Comprehension assist level: Understands basic 90% of the time/cues < 10% of the time;Understands basic 75 - 89% of the time/ requires cueing 10 - 24% of the time  Expression   Expression assist level: Expresses basic 75 - 89% of the time/requires cueing 10 - 24% of the time. Needs helper to occlude trach/needs to repeat words.;Expresses basic  90% of the time/requires cueing < 10% of the time.  Social Interaction Social Interaction assist level: Interacts appropriately 75 - 89% of the time - Needs redirection for appropriate language or to initiate interaction.  Problem Solving Problem solving assist level: Solves basic 25 - 49% of the time - needs direction more than half the time to initiate, plan or complete simple activities;Solves basic 50 - 74% of the time/requires cueing 25 - 49% of the time  Memory Memory assist level: Recognizes or recalls 25 - 49% of the time/requires cueing 50 - 75% of the time    Pain Pain Assessment Pain Assessment: No/denies pain  Therapy/Group: Individual Therapy  Semaya Vida 03/01/2017, 8:09 AM

## 2017-03-01 NOTE — Plan of Care (Signed)
Problem: RH KNOWLEDGE DEFICIT Goal: RH STG INCREASE KNOWLEDGE OF HYPERTENSION Patient and family with demonstrate knowledge of HTN medications and dietary restrictions at discharge with reminders and resources.   Outcome: Not Progressing Pt does not demonstrate motivation to learn, unclear how much pt understands requires cue/prompting for answers. Family visiting inconsistent unable to assess education.

## 2017-03-01 NOTE — Progress Notes (Signed)
Occupational Therapy Session Note  Patient Details  Name: Frank Moss MRN: [id number] Date of Birth: 1971-06-06  Today's Date: 03/01/2017 OT Individual Time: 0949-1100 OT Individual Time Calculation (min): 71 min    Short Term Goals: Week 3:  OT Short Term Goal 1 (Week 3): Pt will initiate use of L UE during bathing tasks with min questioning cue 75% of tasks OT Short Term Goal 2 (Week 3): Pt will donn pull up pants with mod assist sit to stand.  OT Short Term Goal 3 (Week 3): Pt will complete toilet transfer with consistent Mod A  Skilled Therapeutic Interventions/Progress Updates:    OT treatment session focused on modified bathing/dressing, functional use of L UE, and standing balance. Pt completed bathing tasks at the sink with OT focus on forced use of L UE. Noted some improved initation for purposeful use of L UE. Worked in sit<>stand and anterior weight shift when reaching to wash buttocks and peri-area, but still requiring Max to intermitent min A to maintain standing balance. Added wedge under R hip under wc cushion to promote neutral pelvic position. Utilized Pharmacist, hospital for postural awareness. OT then propelled pt outside with total A for wc propulsion. Had pt hold cup in L hand and worked on locating objects on L side of hallway, as well as pushing elevator buttons with L hand. Pt tearful once outside stating he hadn't been outside since admission. Utilized therapeutic use of self to provide emotional support to pt. Pt returned to room at end of session and pt thanked therapist for taking pt outside. Pt left tilted with safety belt on and needs met.    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   Therapy/Group: Individual Therapy  Valma Cava 03/01/2017, 10:37 AM

## 2017-03-01 NOTE — Progress Notes (Signed)
[facility] MEDIC[facility] & REHABILITATION     PROGRESS NOTE  Subjective/Complaints:  Pt seen laying in bed this AM.  He slept well overnight.  He notes improved pain LLE.    ROS: Denies CP, SOB, nausea, vomiting, diarrhea.  Objective: Vital Signs: Blood pressure (!) 168/85, pulse 99, temperature 98.7 F (37.1 C), temperature source Oral, resp. rate 18, height 6\' 2"  (1.88 m), weight 102.8 kg (226 lb 9.9 oz), SpO2 96 %. No results found. No results for input(s): WBC, HGB, HCT, PLT in the last 72 hours. No results for input(s): NA, K, CL, GLUCOSE, BUN, CREATININE, CALCIUM in the last 72 hours.  Invalid input(s): CO CBG (last 3)   Recent Labs  02/28/17 1648 02/28/17 2105 03/01/17 0618  GLUCAP 176* 196* 89    Wt Readings from Last 3 Encounters:  02/23/17 102.8 kg (226 lb 9.9 oz)  02/08/17 103.7 kg (228 lb 11.2 oz)  02/08/17 124.3 kg (274 lb)    Physical Exam:  BP (!) 168/85 (BP Location: Right Arm)   Pulse 99   Temp 98.7 F (37.1 C) (Oral)   Resp 18   Ht 6\' 2"  (1.88 m)   Wt 102.8 kg (226 lb 9.9 oz)   SpO2 96%   BMI 29.10 kg/m  Constitutional: He appears well-developedand well-nourished.  HENT: Normocephalicand atraumatic.  Eyes: EOMI. No discharge. Cardiovascular: RRR. No JVD. Respiratory: Effort normal breath sounds normal.  GI: Soft. Bowel sounds are normal.   Neurological: He is alertand oriented.  Left facial weakness with left gaze preference and right neglect, improving.  Delayed processing with slow speech.  Lacks awareness/insight into deficits. Spastic left hemiparesis  Motor: Left lower extremity: 2/5 proximal to distal Moving LUE spontaneously to do tasks, but not to command.  Skin: Skin is warmand dry.  Psychiatric: His affect is blunt. His speech is delayed. He is slowed. He expresses inappropriate judgment. He is inattentive.    Assessment/Plan: 1. 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, small right thalamic infarct which  require 3+ hours per day of interdisciplinary therapy in a comprehensive inpatient rehab setting. Physiatrist is providing close team supervision and 24 hour management of active medical problems listed below. Physiatrist and rehab team continue to assess barriers to discharge/monitor patient progress toward functional and medical goals.  Function:  Bathing Bathing position   Position: Wheelchair/chair at sink  Bathing parts Body parts bathed by patient: Right arm, Left arm, Abdomen, Chest, Front perineal area, Right upper leg, Left upper leg, Buttocks, Right lower leg Body parts bathed by helper: Left lower leg, Back  Bathing assist Assist Level: Touching or steadying assistance(Pt > 75%)      Upper Body Dressing/Undressing Upper body dressing   What is the patient wearing?: Pull over shirt/dress     Pull over shirt/dress - Perfomed by patient: Thread/unthread right sleeve, Put head through opening, Pull shirt over trunk Pull over shirt/dress - Perfomed by helper: Thread/unthread left sleeve        Upper body assist Assist Level: Touching or steadying assistance(Pt > 75%)      Lower Body Dressing/Undressing Lower body dressing   What is the patient wearing?: Non-skid slipper socks, Pants   Underwear - Performed by helper: Thread/unthread right underwear leg, Thread/unthread left underwear leg, Pull underwear up/down Pants- Performed by patient: Thread/unthread right pants leg Pants- Performed by helper: Pull pants up/down, Thread/unthread left pants leg Non-skid slipper socks- Performed by patient: Don/doff right sock Non-skid slipper socks- Performed by helper: Don/doff left  sock       Shoes - Performed by helper: Don/doff right shoe, Don/doff left shoe, Fasten right, Fasten left          Lower body assist Assist for lower body dressing: Touching or steadying assistance (Pt > 75%)      Toileting Toileting     Toileting steps completed by helper: Performs perineal  hygiene, Adjust clothing prior to toileting, Adjust clothing after toileting Toileting Assistive Devices: Grab bar or rail  Toileting assist Assist level: Two helpers   Transfers Chair/bed transfer   Chair/bed transfer method: Stand pivot, Other Chair/bed transfer assist level: 2 helpers Chair/bed transfer assistive device: Mechanical lift Mechanical lift: Landscape architecttedy   Locomotion Ambulation Ambulation activity did not occur: Safety/medical concerns   Max distance: 15 Assist level: 2 helpers Biomedical engineer(maxA, w/c follow)   Education administratorWheelchair Wheelchair activity did not occur: Safety/medical concerns Type: Manual      Cognition Comprehension Comprehension assist level: Understands basic 90% of the time/cues < 10% of the time, Understands basic 75 - 89% of the time/ requires cueing 10 - 24% of the time  Expression Expression assist level: Expresses basic 75 - 89% of the time/requires cueing 10 - 24% of the time. Needs helper to occlude trach/needs to repeat words., Expresses basic 90% of the time/requires cueing < 10% of the time.  Social Interaction Social Interaction assist level: Interacts appropriately 75 - 89% of the time - Needs redirection for appropriate language or to initiate interaction.  Problem Solving Problem solving assist level: Solves basic 25 - 49% of the time - needs direction more than half the time to initiate, plan or complete simple activities, Solves basic 50 - 74% of the time/requires cueing 25 - 49% of the time  Memory Memory assist level: Recognizes or recalls 25 - 49% of the time/requires cueing 50 - 75% of the time    Medical Problem List and Plan: 1.  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  DVT Prophylaxis/Anticoagulation: Pharmaceutical: Lovenox 3. H/o lumbago/Pain Management: Denies pain at this moment. Tylenol prn 4. Mood: LCSW to follow for evaluation and support.  5.  Neuropsych: This patient is not capable of making decisions on his  own behalf. Trial ritalin Cont lexapro 6. Skin/Wound Care: routine pressure relief measures 7. Fluids/Electrolytes/Nutrition: Monitor I/Os--offer supplements if intake poor.   8.T2DM: monitor BS ac/hs. Continue lantus daily with meal coverage. Will use SSI for elevated BS.    CBG (last 3)   Recent Labs  02/28/17 1648 02/28/17 2105 03/01/17 0618  GLUCAP 176* 196* 89   Increased lantus to 36U,   Labile on 8/14 9. HTN:  Monitor BP bid  Norvasc 5mg  daily, increased to 7.5 on 7/28, increased to 10mg  7/30  Changed flomax to Hytrin 8/3 increase to 4mg  BID on 8/9  Hydralazine 10 started 8/13, increased to 25 on 8/14 Vitals:   02/28/17 1437 03/01/17 0552  BP: (!) 166/88 (!) 168/85  Pulse: (!) 104 99  Resp: 18 18  Temp: 98.3 F (36.8 C) 98.7 F (37.1 C)  SpO2: 100% 96%   10 H/o urinary retention: currently incontinent. Toilet patient every 4 hours and cath if retention noted.   11. Hyperlipidemia: On atorvastatin.  12. Question CKD: baseline SCr around 1.6 per records review. Continue to monitor.  Offer fluids between meals for adequate hydration.   Cr. 1.25 on 8/8  Labs ordered for tomorrow  Cont to monitor 13. Hyponatremia: Resolved   Sodium 137 on 8/8  Continue to monitor 14. Hypoalbuminemia  Supplement initiated 7/28 15. Acute blood loss anemia  Hemoglobin 11.0 on 8/8   Labs ordered for tomorrow  Continue to monitor 16.  Spasticity- Left side , L hamstring most affected has daytime spasticity , Increased zanaflex to 2mg  BID and 4mg  Qhs,Botox 100 U to L hamstring on 8/5 17.  Urinary incont- occ retention now on Hytrin increase to 4mg  BID,     Urecholine 25mg QID, increased to 50 on 8/14 18.  Bowel incont   LOS (Days) 18 A FACE TO FACE EVALUATION WAS PERFORMED  Alaiya Martindelcampo Karis Juba 03/01/2017 8:20 AM

## 2017-03-01 NOTE — Plan of Care (Signed)
Problem: RH BLADDER ELIMINATION Goal: RH STG MANAGE BLADDER WITH ASSISTANCE STG Manage Bladder incontinence With Moderate Assistance   Outcome: Not Progressing Pt remains incontinent with inconsistent use of urinal.

## 2017-03-01 NOTE — Progress Notes (Signed)
Physical Therapy Session Note  Patient Details  Name: Frank Moss MRN: [id number] Date of Birth: 1971-04-14  Today's Date: 03/01/2017 PT Individual Time: 2902-1115 AND 1300-1258 PT Individual Time Calculation (min): 28 min AND 58 min  Short Term Goals: Week 3:  PT Short Term Goal 1 (Week 3): Pt will tolerate standing upright with weight bearing through L LE x 3 min without UE support to aid in ability to assist in ADL's. PT Short Term Goal 2 (Week 3): Pt will tolerate sitting in standard wheelchair without supervision x 3 hours. PT Short Term Goal 3 (Week 3): Pt will ambulate 5 ft with +1 assistance PT Short Term Goal 4 (Week 3): W/C mobility to be initiated in standard w/c.  Skilled Therapeutic Interventions/Progress Updates:   Session 1: Pt supine in bed upon arrival and agreeable to therapy, no c/o pain. Per RN's note on 02/28/17 regarding w/c, worked on w/c management this session. Fixed pt's w/c and transferred to w/c from EOB. Pt required Max A for verbal, visual, and tactile cues to initiate and complete transfer, however transfer was performed via stand pivot w/ Min A towards R side. Ended session in w/c w/ pink safety belt on, call bell within reach and all needs met.   Session 2: Pt in w/c upon arrival and agreeable to therapy, no c/o pain. Worked on endurance, reciprocal movement pattern, and LE strength this session. NuStep 30 min @ L3 using bilateral LEs and UEs. Verbal and visual cues to attend to task and decrease L hip abduction. Emphasis on reciprocal movement pattern and autogenic inhibition to promote L hamstring relaxation and elongation. Pt reports decreased LLE tightness after session. Ended session in w/c, call bell within reach and all needs met.   Therapy Documentation Precautions:  Precautions Precautions: Fall Precaution Comments: pusher to the left, apraxia, decreased initiation, left hemiparesis Restrictions Weight Bearing Restrictions: No Vital  Signs: Therapy Vitals Temp: 98.7 F (37.1 C) Temp Source: Oral Pulse Rate: 99 Resp: 18 BP: (!) 168/85 Patient Position (if appropriate): Lying Oxygen Therapy SpO2: 96 % O2 Device: Not Delivered Pain: Pain Assessment Pain Assessment: No/denies pain  See Function Navigator for Current Functional Status.   Therapy/Group: Individual Therapy  Abra Lingenfelter K Arnette 03/01/2017, 9:33 AM

## 2017-03-02 ENCOUNTER — Inpatient Hospital Stay (HOSPITAL_COMMUNITY): Payer: 59 | Admitting: Occupational Therapy

## 2017-03-02 ENCOUNTER — Inpatient Hospital Stay (HOSPITAL_COMMUNITY): Payer: 59 | Admitting: Physical Therapy

## 2017-03-02 ENCOUNTER — Inpatient Hospital Stay (HOSPITAL_COMMUNITY): Payer: 59

## 2017-03-02 LAB — CBC
HEMATOCRIT: 31.6 % — AB (ref 39.0–52.0)
HEMOGLOBIN: 10.6 g/dL — AB (ref 13.0–17.0)
MCH: 27 pg (ref 26.0–34.0)
MCHC: 33.5 g/dL (ref 30.0–36.0)
MCV: 80.4 fL (ref 78.0–100.0)
Platelets: 261 10*3/uL (ref 150–400)
RBC: 3.93 MIL/uL — AB (ref 4.22–5.81)
RDW: 13.9 % (ref 11.5–15.5)
WBC: 4.8 10*3/uL (ref 4.0–10.5)

## 2017-03-02 LAB — BASIC METABOLIC PANEL
ANION GAP: 7 (ref 5–15)
BUN: 23 mg/dL — ABNORMAL HIGH (ref 6–20)
CALCIUM: 9 mg/dL (ref 8.9–10.3)
CHLORIDE: 102 mmol/L (ref 101–111)
CO2: 30 mmol/L (ref 22–32)
Creatinine, Ser: 1.18 mg/dL (ref 0.61–1.24)
GFR calc non Af Amer: >60 mL/min (ref >60)
Glucose, Bld: 89 mg/dL (ref 65–99)
POTASSIUM: 4 mmol/L (ref 3.5–5.1)
Sodium: 139 mmol/L (ref 135–145)

## 2017-03-02 LAB — GLUCOSE, CAPILLARY
GLUCOSE-CAPILLARY: 88 mg/dL (ref 65–99)
Glucose-Capillary: 112 mg/dL — ABNORMAL HIGH (ref 65–99)
Glucose-Capillary: 145 mg/dL — ABNORMAL HIGH (ref 65–99)
Glucose-Capillary: 183 mg/dL — ABNORMAL HIGH (ref 65–99)

## 2017-03-02 NOTE — Patient Care Conference (Signed)
Inpatient RehabilitationTeam Conference and Plan of Care Update Date: 03/02/2017   Time: 11:30 AM    Patient Name: Frank Moss      Medical Record Number: [id number]  Date of Birth: [birth year] Sex: Male         Room/Bed: 4M02C/4M02C-01 Payor Info: Payor: [employer] / Plan: Research scientist (life sciences)GENERIC [employer] / Product Type: *No Product type* /    Admitting Diagnosis: CVA  Admit Date/Time:  02/11/2017  6:41 PM Admission Comments: No comment available   Primary Diagnosis:  Spastic hemiparesis (HCC) Principal Problem: Spastic hemiparesis (HCC)  Patient Active Problem List   Diagnosis Date Noted  . Labile blood pressure   . Labile blood glucose   . 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 Diabetes mellitus type 2 in nonobese (HCC)   . Hypertensive crisis   . Diabetes mellitus (HCC)   . Benign essential HTN   . Urinary retention   . Stage 3 chronic kidney disease   . Hyponatremia   . Hypoalbuminemia due to protein-calorie malnutrition (HCC)   . Acute blood loss anemia   . Acute thalamic infarction (HCC) 02/11/2017  . Spastic hemiparesis (HCC) 02/10/2017  . Cognitive deficit due to recent stroke 02/10/2017  . Hemi-neglect of left side 02/10/2017  . CKD (chronic kidney disease), stage III 02/09/2017  . Dysphagia as late effect of stroke 02/08/2017  . Acute ischemic right ACA stroke (HCC) 01/31/2017  . Mixed hyperlipidemia 11/17/2007  . Accelerated hypertension 11/17/2007  . DM (diabetes mellitus), type 2 with peripheral vascular complications (HCC) 10/09/2007  . ERECTILE DYSFUNCTION, MILD 10/09/2007  . SHOULDER PAIN, LEFT 10/09/2007  . COLONIC POLYPS, HX OF 10/09/2007    Expected Discharge Date: Expected Discharge Date: 03/11/17  Team Members Present: Physician leading conference: Dr. Claudette LawsAndrew Kirsteins Social Worker Present: Dossie DerBecky Abdurahman Rugg, LCSW Nurse Present: Chrissie NoaMelanie Barnes, RN PT Present: Teodoro Kilaitlin Penven-Crew, PT OT Present: Kearney HardElisabeth Doe, OT SLP Present: Colin BentonMadison  Cratch, SLP PPS Coordinator present : Edson SnowballBecky Windsor, PT     Current Status/Progress Goal Weekly Team Focus  Medical   Severe dysarthria, severe left neglect, impulsivity, alien hand syndrome, urinary retention  Normal bladder emptying, improve awareness of left side,  Bladder management increasing Urecholine dosing   Bowel/Bladder   Incontinent of bowel, LBM 8/14, in/out PVR Q 4-6hrs with cath over 350 ml, voids sporadically in between, urecholine dosage increased today  less episodes of incontinence  continue to monitor for urination & cath as needed   Swallow/Nutrition/ Hydration             ADL's   Mod A/Max A, Mod A standing balance during ADL  Min A overall  L NMR, apraxia, attention, initiation, pt.family ed,    Mobility   Mod A stand pivot transfers, Max A gait at rail  min A bed mobility and transfers, mod A ambulaiton with LRAD, supervision w/c mobility  L attention, initiation and motor planning, ambulation, LLE ROM and L hamstring pain management, transfers   Communication             Safety/Cognition/ Behavioral Observations  Mod A to Max A  Min A  basic problem solving, sustained attention, locating items on left, memory and awareness.   Pain   no c/o pain during shift/ has tylenol prn & has taken in sporadically for left leg pain, also has scedukled xanaflex  pain scale <2  continue to assess & treat as needed   Skin   no areas of skin break down, slight bruising  to abdomen from lovenox  no new areas of skin break down  continue to assess q shift      *See Care Plan and progress notes for long and short-term goals.     Barriers to Discharge  Current Status/Progress Possible Resolutions Date Resolved   Physician    Medical stability;Home environment access/layout;Neurogenic Bowel & Bladder;Other (comments)  Cognitive status, severe left neglect, poor sensation and awareness of deficits  Progressing toward goals  Continue rehabilitation program, will still need some  adjustment of urologic medications      Nursing  Incontinence;Neurogenic Bowel & Bladder;Medication compliance               PT  Inaccessible home environment;Decreased caregiver support;Home environment access/layout;Other (comments)  stairs to enter home, requires physical assistance, poor initiation and environmental awareness              OT                  SLP                SW                Discharge Planning/Teaching Needs:  Wife and daughter along wiht daughter's partner to provide care to pt at discharge. Will ask for them to begin coming in for therapies.      Team Discussion:  Making slow progress and small gains in therapies. Hamstring somewhat better-still having pain with transfers. MD to check knee x-ray. Apraxia is very limiting. MD increasing his bladder medications in hopes of him emptying and not needing I & O caths.Inattention and initiation limited him and his ability to participate in therapies. May downgrade PT goals to mod level of assist. See if family can provide this level of assist.  Revisions to Treatment Plan:  DC 8/24 downgrade PT goals to mod level of assist    Continued Need for Acute Rehabilitation Level of Care: The patient requires daily medical management by a physician with specialized training in physical medicine and rehabilitation for the following conditions: Daily direction of a multidisciplinary physical rehabilitation program to ensure safe treatment while eliciting the highest outcome that is of practical value to the patient.: Yes Daily medical management of patient stability for increased activity during participation in an intensive rehabilitation regime.: Yes Daily analysis of laboratory values and/or radiology reports with any subsequent need for medication adjustment of medical intervention for : Neurological problems;Urological problems  Vivia Rosenburg, Lemar Livings 03/02/2017, 12:55 PM

## 2017-03-02 NOTE — Plan of Care (Signed)
Problem: RH KNOWLEDGE DEFICIT Goal: RH STG INCREASE KNOWLEDGE OF DIABETES Patient and family will demonstrate increased knowledge of diabetes and associated medications, s/s of hypo/hyperglycemia, dietary restrictions, and follow-up with the MD post discharge with resources/reminders.   Outcome: Not Progressing Wife not interested in speaking with diabetic nurse.

## 2017-03-02 NOTE — Progress Notes (Signed)
Speech Language Pathology Daily Session Note  Patient Details  Name: Frank Moss MRN: [id number] Date of Birth: 29-Dec-1970  Today's Date: 03/02/2017 SLP Individual Time: 1030-1100 SLP Individual Time Calculation (min): 30 min  Short Term Goals: Week 3: SLP Short Term Goal 1 (Week 3): Pt will sustain his attention to basic, familiar tasks for 10 minutes with mod assist verbal cues for redirection.   SLP Short Term Goal 2 (Week 3): Pt will utilize external aids to recall basic, daily information with min assist verbal cues.   SLP Short Term Goal 3 (Week 3): Pt will complete basic, familiar tasks with mod assist verbal cues for functional problem solving.   SLP Short Term Goal 4 (Week 3): Pt will attend/scan to left field of enviornment/body during functional tasks with Min A verbal cues.   Skilled Therapeutic Interventions: Skilled ST services focused on cognitive goals. SLP facilitated recall of novel and daily information utilizing visual aid of therapy schedule given initial education then delayed recall pt demonstrated Mod A visual and verbal cues. SLP facilitated problem solving task of sequencing three steps given functional cards, given initial demonstration pt required Mod A verbal cues. Pt was left in chair in room with call bell within reach. Recommend to continue ST services.      Function:  Eating Eating                 Cognition Comprehension Comprehension assist level: Understands basic 90% of the time/cues < 10% of the time;Understands basic 75 - 89% of the time/ requires cueing 10 - 24% of the time  Expression   Expression assist level: Expresses basic 75 - 89% of the time/requires cueing 10 - 24% of the time. Needs helper to occlude trach/needs to repeat words.  Social Interaction Social Interaction assist level: Interacts appropriately 75 - 89% of the time - Needs redirection for appropriate language or to initiate interaction.  Problem Solving Problem solving  assist level: Solves basic 25 - 49% of the time - needs direction more than half the time to initiate, plan or complete simple activities  Memory Memory assist level: Recognizes or recalls 25 - 49% of the time/requires cueing 50 - 75% of the time    Pain Pain Assessment Pain Assessment: No/denies pain Faces Pain Scale: Hurts even more Pain Type: Neuropathic pain Pain Location: Leg Pain Orientation: Left Pain Descriptors / Indicators: Discomfort Pain Onset: With Activity Pain Intervention(s): Repositioned  Therapy/Group: Individual Therapy  Jefferey Lippmann  [facility] 03/02/2017, 5:14 PM

## 2017-03-02 NOTE — Progress Notes (Signed)
Physical Therapy Session Note  Patient Details  Name: Frank Moss MRN: [id number] Date of Birth: [birth year]  Today's Date: 03/02/2017 PT Individual Time: 1300-1345 PT Individual Time Calculation (min): 45 min   Short Term Goals: Week 3:  PT Short Term Goal 1 (Week 3): Pt will tolerate standing upright with weight bearing through L LE x 3 min without UE support to aid in ability to assist in ADL's. PT Short Term Goal 2 (Week 3): Pt will tolerate sitting in standard wheelchair without supervision x 3 hours. PT Short Term Goal 3 (Week 3): Pt will ambulate 5 ft with +1 assistance PT Short Term Goal 4 (Week 3): W/C mobility to be initiated in standard w/c.  Skilled Therapeutic Interventions/Progress Updates: Pt presented in [place] chair completing lunch agreeable to therapy. Transported to ortho gym for quiet environment. Attempted squat pivot transfer to mat. Pt performed stand pivot to mat maxA, demonstrating good placement of LLE however poor awareness/planning of RLE. Pt able to maintain fair sitting balance. Attempted squat pivot transfer return to chair. Pt with difficulty planning wt shift to L, head/hip relationship to facilitate transfer, and demonstrated pushing to when attempted to shift to left. Pt able to performed squat pivot after numerous attempts with PTA providing moderate tactile cues for anterior wt shift and maxA for wt shift to L in conjunction with verbal cues. Pt returned to room and remained in [place] chair with call bell within reach and needs met.      Therapy Documentation Precautions:  Precautions Precautions: Fall Precaution Comments: pusher to the left, apraxia, decreased initiation, left hemiparesis Restrictions Weight Bearing Restrictions: No General:   Vital Signs: Therapy Vitals Temp: 98.7 F (37.1 C) Temp Source: Oral Pulse Rate: 93 Resp: 17 BP: (!) 143/77 Patient Position (if appropriate): Lying Oxygen Therapy SpO2: 100 % O2 Device: Not  Delivered Pain: Pain Assessment Pain Assessment: Faces Faces Pain Scale: Hurts even more Pain Type: Neuropathic pain Pain Location: Leg Pain Orientation: Left Pain Descriptors / Indicators: Discomfort Pain Onset: With Activity Pain Intervention(s): Repositioned     See Function Navigator for Current Functional Status.   Therapy/Group: Individual Therapy  Skylar Flynt  Benjie Ricketson, PTA  03/02/2017, 3:50 PM

## 2017-03-02 NOTE — Progress Notes (Signed)
[facility]ICAL [facility]ICINE & REHABILITATION     PROGRESS NOTE  Subjective/Complaints:   No pains, per RN still requiring caths, pt c/o discomfort with cath, lidocaine jelly ordered but not used ROS: Denies CP, SOB, nausea, vomiting, diarrhea.  Objective: Vital Signs: Blood pressure (!) 157/73, pulse 99, temperature 98.9 F (37.2 C), temperature source Oral, resp. rate 18, height 6' 2" (1.88 m), weight 103 kg (227 lb 0.7 oz), SpO2 98 %. No results found.  Recent Labs  03/02/17 0448  WBC 4.8  HGB 10.6*  HCT 31.6*  PLT 261    Recent Labs  03/02/17 0448  NA 139  K 4.0  CL 102  GLUCOSE 89  BUN 23*  CREATININE 1.18  CALCIUM 9.0   CBG (last 3)   Recent Labs  03/01/17 1634 03/01/17 2129 03/02/17 0627  GLUCAP 172* 149* 88    Wt Readings from Last 3 Encounters:  03/02/17 103 kg (227 lb 0.7 oz)  02/08/17 103.7 kg (228 lb 11.2 oz)  02/08/17 124.3 kg (274 lb)    Physical Exam:  BP (!) 157/73 (BP Location: Right Arm)   Pulse 99   Temp 98.9 F (37.2 C) (Oral)   Resp 18   Ht 6' 2" (1.88 m)   Wt 103 kg (227 lb 0.7 oz)   SpO2 98%   BMI 29.15 kg/m  Constitutional: He appears well-developedand well-nourished.  HENT: Normocephalicand atraumatic.  Eyes: EOMI. No discharge. Cardiovascular: RRR. No JVD. Respiratory: Effort normal breath sounds normal.  GI: Soft. Bowel sounds are normal.   Neurological: He is alertand oriented.  Left facial weakness with left gaze preference and right neglect, improving.  Delayed processing with slow speech.  Lacks awareness/insight into deficits. Spastic left hemiparesis  Motor: Left lower extremity: 2/5 proximal to distal Moving LUE spontaneously to do tasks, but not to command.  Skin: Skin is warmand dry.  Psychiatric: His affect is blunt. His speech is delayed. He is slowed. He expresses inappropriate judgment. He is inattentive.    Assessment/Plan: 1. 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, small right  thalamic infarct which require 3+ hours per day of interdisciplinary therapy in a comprehensive inpatient rehab setting. Physiatrist is providing close team supervision and 24 hour management of active medical problems listed below. Physiatrist and rehab team continue to assess barriers to discharge/monitor patient progress toward functional and medical goals.  Function:  Bathing Bathing position   Position: Wheelchair/chair at sink  Bathing parts Body parts bathed by patient: Right arm, Left arm, Abdomen, Chest, Front perineal area, Right upper leg, Left upper leg, Buttocks, Right lower leg Body parts bathed by helper: Left lower leg, Back  Bathing assist Assist Level: Touching or steadying assistance(Pt > 75%)      Upper Body Dressing/Undressing Upper body dressing   What is the patient wearing?: Pull over shirt/dress     Pull over shirt/dress - Perfomed by patient: Thread/unthread right sleeve, Put head through opening, Pull shirt over trunk Pull over shirt/dress - Perfomed by helper: Thread/unthread left sleeve        Upper body assist Assist Level: Touching or steadying assistance(Pt > 75%)      Lower Body Dressing/Undressing Lower body dressing   What is the patient wearing?: Non-skid slipper socks, Pants   Underwear - Performed by helper: Thread/unthread right underwear leg, Thread/unthread left underwear leg, Pull underwear up/down Pants- Performed by patient: Thread/unthread right pants leg Pants- Performed by helper: Pull pants up/down, Thread/unthread left pants leg Non-skid slipper socks-  Performed by patient: Don/doff right sock Non-skid slipper socks- Performed by helper: Don/doff left sock       Shoes - Performed by helper: Don/doff right shoe, Don/doff left shoe, Fasten right, Fasten left          Lower body assist Assist for lower body dressing: Touching or steadying assistance (Pt > 75%)      Toileting Toileting     Toileting steps completed by  helper: Performs perineal hygiene, Adjust clothing prior to toileting, Adjust clothing after toileting Toileting Assistive Devices: Grab bar or rail  Toileting assist Assist level: Two helpers   Transfers Chair/bed transfer   Chair/bed transfer method: Stand pivot Chair/bed transfer assist level: Moderate assist (Pt 50 - 74%/lift or lower) Chair/bed transfer assistive device: Armrests Mechanical lift: Stedy   Locomotion Ambulation Ambulation activity did not occur: Safety/medical concerns   Max distance: 15 Assist level: 2 helpers Scientist, research (medical), w/c follow)   Oceanographer activity did not occur: Safety/medical concerns Type: Manual      Cognition Comprehension Comprehension assist level: Understands basic 90% of the time/cues < 10% of the time, Understands basic 75 - 89% of the time/ requires cueing 10 - 24% of the time  Expression Expression assist level: Expresses basic 75 - 89% of the time/requires cueing 10 - 24% of the time. Needs helper to occlude trach/needs to repeat words., Expresses basic 90% of the time/requires cueing < 10% of the time.  Social Interaction Social Interaction assist level: Interacts appropriately 75 - 89% of the time - Needs redirection for appropriate language or to initiate interaction.  Problem Solving Problem solving assist level: Solves basic 25 - 49% of the time - needs direction more than half the time to initiate, plan or complete simple activities, Solves basic 50 - 74% of the time/requires cueing 25 - 49% of the time  Memory Memory assist level: Recognizes or recalls 25 - 49% of the time/requires cueing 50 - 75% of the time    Medical Problem List and Plan: 1.  Left hemiparesis, hemisensory loss,  and cognitive deficits secondary to right ACA infarct, small right thalamic infarct on 7/24  Cont CIR, Team conference today please see physician documentation under team conference tab, met with team face-to-face to discuss problems,progress, and goals.  Formulized individual treatment plan based on medical history, underlying problem and comorbidities.2.  DVT Prophylaxis/Anticoagulation: Pharmaceutical: Lovenox 3. H/o lumbago/Pain Management: Denies pain at this moment. Tylenol prn 4. Mood: LCSW to follow for evaluation and support.  5. Neuropsych: This patient is not capable of making decisions on his  own behalf. Trial ritalin Cont lexapro 6. Skin/Wound Care: routine pressure relief measures 7. Fluids/Electrolytes/Nutrition: Monitor I/Os--offer supplements if intake poor.   8.T2DM: monitor BS ac/hs. Continue lantus daily with meal coverage. Will use SSI for elevated BS.    CBG (last 3)   Recent Labs  03/01/17 1634 03/01/17 2129 03/02/17 0627  GLUCAP 172* 149* 88   Increased lantus to 35U,   Some lability but within desired range of 80-180 9. HTN:  Monitor BP bid  Norvasc  increased to 66m 7/30  Changed flomax to Hytrin 8/3 increase to 47mBID on 8/9  Hydralazine 10 started 8/13, increased to 25 on 8/14, monitor response Vitals:   03/01/17 1500 03/02/17 0600  BP: (!) 149/79 (!) 157/73  Pulse: 100 99  Resp: 18 18  Temp: 99.2 F (37.3 C) 98.9 F (37.2 C)  SpO2: 100% 98%   10 H/o urinary retention: currently incontinent. Toilet  patient every 4 hours and cath if retention noted.   11. Hyperlipidemia: On atorvastatin.  12. Question CKD: baseline SCr around 1.6 per records review. Continue to monitor.  Offer fluids between meals for adequate hydration.   Cr. 1.18 on 8/15  Labs ordered for tomorrow  Cont to monitor 13. Hyponatremia: Resolved   Sodium 137 on 8/8  Continue to monitor 14. Hypoalbuminemia  Supplement initiated 7/28 15. Acute blood loss anemia  Hemoglobin 11.0 on 8/8   Labs ordered for tomorrow  Continue to monitor 16.  Spasticity- Left side , L hamstring most affected has daytime spasticity , Increased zanaflex to 43m BID and 445mQhs,Botox 100 U to L hamstring on 8/5 17.  Urinary incont- occ retention now on  Hytrin increase to 50m61mID,     Urecholine 26m46m, increased to 50 on 8/14 18.  Bowel incont   LOS (Days) 19 A FACE TO FACE EVALUATION WAS PERFORMED  KIRSCharlett Blake5/2018 7:46 AM

## 2017-03-02 NOTE — Progress Notes (Signed)
Physical Therapy Session Note  Patient Details  Name: Frank Moss MRN: [id number] Date of Birth: 08/14/1970  Today's Date: 03/02/2017 PT Individual Time: 0900-1000 PT Individual Time Calculation (min): 60 min   Short Term Goals: Week 3:  PT Short Term Goal 1 (Week 3): Pt will tolerate standing upright with weight bearing through L LE x 3 min without UE support to aid in ability to assist in ADL's. PT Short Term Goal 2 (Week 3): Pt will tolerate sitting in standard wheelchair without supervision x 3 hours. PT Short Term Goal 3 (Week 3): Pt will ambulate 5 ft with +1 assistance PT Short Term Goal 4 (Week 3): W/C mobility to be initiated in standard w/c.  Skilled Therapeutic Interventions/Progress Updates:    no c/o pain at rest.  Session focus on initiation and motor planning via functional mobility, functional/familiar tasks, and blocked practice.    Pt performed LB dressing at bed level, assist to thread BLEs and pt able to pull over hips with mod cues to complete task.  Supine>sit with mod assist and max multimodal cues for attention and sequencing.  UB dressed at EOB with supervision for balance and min verbal cues for sequencing dressing.  Stand/pivot to w/c with 3-muskateers, increased time to complete task.    Gait 2x30' at rail in hallway with max assist to advance LLE, improved gait pattern with increased speed and repetition.  Gait x50' with 3 muskateers, facilitation for L/R weight shift, and occasional assist for LLE swing through 2/2 tone (pt able to advance LLE in about 50% of opportunities).    Blocked practice for pivot transfers w/c<>mat to R/L with mod to R and max to L.  Pt continues to demo significant deficits in attention and motor planning which cause his assist level to fluctuate.    Pt returned to room at end of session and positioned in tilt in space w/c with QRB in place, call bell in reach and needs met.   Therapy Documentation Precautions:   Precautions Precautions: Fall Precaution Comments: pusher to the left, apraxia, decreased initiation, left hemiparesis Restrictions Weight Bearing Restrictions: No   See Function Navigator for Current Functional Status.   Therapy/Group: Individual Therapy  Earnest Conroy Penven-Crew 03/02/2017, 12:08 PM

## 2017-03-02 NOTE — Progress Notes (Signed)
Social Work Patient ID: Frank Moss, male   DOB: February 01, 1971, 46 y.o.   MRN: [id number]  Have left a message for wife to give team conference update and discuss discharge plans.  Will need to see if family can provide the care pt requires and begin family training. Await wife's return call.

## 2017-03-02 NOTE — Progress Notes (Signed)
Attempted to catheterized patient twice. Bladder scan resulted in 400 ml in bladder. NT attempted to cath with nurse assist. Catheter was pushing out & there was resistance. Another attempt was made with a new catheter by the nurse & the catheter was coiling. No urine was released either time. Both times, lidocaine & lubrication were used for comfort. On call provider was called & informed. Was instructed to wait for a while to allow patient to relax before attempting with a coude catheter.Patient seems like in no distress at this time. Will continue to monitor.

## 2017-03-02 NOTE — Progress Notes (Signed)
Occupational Therapy Session Note  Patient Details  Name: Frank Moss MRN: [id number] Date of Birth: Aug 17, 1970  Today's Date: 03/02/2017 OT Individual Time: 1405-1500 OT Individual Time Calculation (min): 55 min   Short Term Goals: Week 3:  OT Short Term Goal 1 (Week 3): Pt will initiate use of L UE during bathing tasks with min questioning cue 75% of tasks OT Short Term Goal 2 (Week 3): Pt will donn pull up pants with mod assist sit to stand.  OT Short Term Goal 3 (Week 3): Pt will complete toilet transfer with consistent Mod A  Skilled Therapeutic Interventions/Progress Updates:    OT treatment session focused on blocked transfer training, trunk extension,  and L NMR. Slideboard transfer completed to therapy mat with Max A, increased time and max multimodal cues and facilitation for motor planning. Pt with difficulty w/ head/hips relationship for scooting. Blocked practice for scooting along mat to L and R with max facilitation. 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 Placed pillows under L elbow and had pt reach for objects with R UE across body to L to promote L UE weight bearing, trunk extension, and decrease pushing. Worked on trunk extension/shortening relationship with reaching overhead with L UE to grasp cups. Graded activity to using large peg board and reaching across midline with L UE.  Pt needed hand over hand A to initiate movement with L UE, then able to place in peg board once movement initiated. Pt transferred back to wc using slide board with Max A and continued difficulty facilitating head/hips relationship 2/2 motor planning and pusher syndrome. Pt left seated in TIS wc at end of session with needs met.   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  Therapy/Group: Individual Therapy  Valma Cava 03/02/2017, 3:39 PM

## 2017-03-03 ENCOUNTER — Inpatient Hospital Stay (HOSPITAL_COMMUNITY): Payer: 59 | Admitting: Speech Pathology

## 2017-03-03 ENCOUNTER — Inpatient Hospital Stay (HOSPITAL_COMMUNITY): Payer: 59 | Admitting: Physical Therapy

## 2017-03-03 ENCOUNTER — Inpatient Hospital Stay (HOSPITAL_COMMUNITY): Payer: 59 | Admitting: Occupational Therapy

## 2017-03-03 LAB — GLUCOSE, CAPILLARY
GLUCOSE-CAPILLARY: 101 mg/dL — AB (ref 65–99)
Glucose-Capillary: 96 mg/dL (ref 65–99)
Glucose-Capillary: 130 mg/dL — ABNORMAL HIGH (ref 65–99)
Glucose-Capillary: 104 mg/dL — ABNORMAL HIGH (ref 65–99)

## 2017-03-03 MED ORDER — METHYLPHENIDATE HCL 5 MG PO TABS
10.0000 mg | ORAL_TABLET | Freq: Two times a day (BID) | ORAL | Status: DC
  Administered 2017-03-03 – 2017-03-09 (×14): 10 mg via ORAL
  Filled 2017-03-03 (×15): qty 2

## 2017-03-03 NOTE — Progress Notes (Signed)
[facility]     PROGRESS NOTE  Subjective/Complaints:   CNA notes increasing difficulty with cathing  ROS: Denies CP, SOB, nausea, vomiting, diarrhea.  Objective: Vital Signs: Blood pressure (!) 153/59, pulse 97, temperature 98.3 F (36.8 C), temperature source Oral, resp. rate 18, height 6\' 2"  (1.88 m), weight 103 kg (227 lb 0.7 oz), SpO2 100 %. No results found.  Recent Labs  03/02/17 0448  WBC 4.8  HGB 10.6*  HCT 31.6*  PLT 261    Recent Labs  03/02/17 0448  NA 139  K 4.0  CL 102  GLUCOSE 89  BUN 23*  CREATININE 1.18  CALCIUM 9.0   CBG (last 3)   Recent Labs  03/02/17 1643 03/02/17 2050 03/03/17 0628  GLUCAP 145* 183* 96    Wt Readings from Last 3 Encounters:  03/02/17 103 kg (227 lb 0.7 oz)  02/08/17 103.7 kg (228 lb 11.2 oz)  02/08/17 124.3 kg (274 lb)    Physical Exam:  BP (!) 153/59 (BP Location: Right Arm)   Pulse 97   Temp 98.3 F (36.8 C) (Oral)   Resp 18   Ht 6\' 2"  (1.88 m)   Wt 103 kg (227 lb 0.7 oz)   SpO2 100%   BMI 29.15 kg/m  Constitutional: He appears well-developedand well-nourished.  HENT: Normocephalicand atraumatic.  Eyes: EOMI. No discharge. Cardiovascular: RRR. No JVD. Respiratory: Effort normal breath sounds normal.  GI: Soft. Bowel sounds are normal.   Neurological: He is alertand oriented.  Left facial weakness with left gaze preference and right neglect, improving.  Delayed processing with slow speech.  Lacks awareness/insight into deficits. Spastic left hemiparesis  Motor: Left lower extremity: 2/5 proximal to distal Moving LUE spontaneously to do tasks, but not to command.  Skin: Skin is warmand dry.  Psychiatric: His affect is blunt. His speech is delayed. He is slowed. He expresses inappropriate judgment. He is inattentive.    Assessment/Plan: 1. 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, small right thalamic infarct which require 3+ hours per day of  interdisciplinary therapy in a comprehensive inpatient rehab setting. Physiatrist is providing close team supervision and 24 hour management of active medical problems listed below. Physiatrist and rehab team continue to assess barriers to discharge/monitor patient progress toward functional and medical goals.  Function:  Bathing Bathing position   Position: Wheelchair/chair at sink  Bathing parts Body parts bathed by patient: Right arm, Left arm, Abdomen, Chest, Front perineal area, Right upper leg, Left upper leg, Buttocks, Right lower leg Body parts bathed by helper: Left lower leg, Back  Bathing assist Assist Level: Touching or steadying assistance(Pt > 75%)      Upper Body Dressing/Undressing Upper body dressing   What is the patient wearing?: Pull over shirt/dress     Pull over shirt/dress - Perfomed by patient: Thread/unthread right sleeve, Put head through opening, Pull shirt over trunk Pull over shirt/dress - Perfomed by helper: Thread/unthread left sleeve        Upper body assist Assist Level: Touching or steadying assistance(Pt > 75%)      Lower Body Dressing/Undressing Lower body dressing   What is the patient wearing?: Non-skid slipper socks, Pants   Underwear - Performed by helper: Thread/unthread right underwear leg, Thread/unthread left underwear leg, Pull underwear up/down Pants- Performed by patient: Thread/unthread right pants leg Pants- Performed by helper: Pull pants up/down, Thread/unthread left pants leg Non-skid slipper socks- Performed by patient: Don/doff right sock Non-skid slipper socks- Performed by  helper: Don/doff left sock       Shoes - Performed by helper: Don/doff right shoe, Don/doff left shoe, Fasten right, Fasten left          Lower body assist Assist for lower body dressing: Touching or steadying assistance (Pt > 75%)      Toileting Toileting     Toileting steps completed by helper: Performs perineal hygiene, Adjust clothing  prior to toileting, Adjust clothing after toileting Toileting Assistive Devices: Grab bar or rail  Toileting assist Assist level: Two helpers   Transfers Chair/bed transfer   Chair/bed transfer method: Squat pivot Chair/bed transfer assist level: Maximal assist (Pt 25 - 49%/lift and lower) Chair/bed transfer assistive device: Armrests Mechanical lift: Stedy   Locomotion Ambulation Ambulation activity did not occur: Safety/medical concerns   Max distance: 50 Assist level: 2 helpers   Education administrator activity did not occur: Safety/medical concerns Type: Manual      Cognition Comprehension Comprehension assist level: Understands basic 90% of the time/cues < 10% of the time, Understands basic 75 - 89% of the time/ requires cueing 10 - 24% of the time  Expression Expression assist level: Expresses basic 75 - 89% of the time/requires cueing 10 - 24% of the time. Needs helper to occlude trach/needs to repeat words.  Social Interaction Social Interaction assist level: Interacts appropriately 75 - 89% of the time - Needs redirection for appropriate language or to initiate interaction.  Problem Solving Problem solving assist level: Solves basic 25 - 49% of the time - needs direction more than half the time to initiate, plan or complete simple activities  Memory Memory assist level: Recognizes or recalls 25 - 49% of the time/requires cueing 50 - 75% of the time    Medical Problem List and Plan: 1.  Left hemiparesis, hemisensory loss,  and cognitive deficits secondary to right ACA infarct, small right thalamic infarct on 7/24  Cont CIR,PT, OT, SLP.2.  DVT Prophylaxis/Anticoagulation: Pharmaceutical: Lovenox 3. H/o lumbago/Pain Management: Denies pain at this moment. Tylenol prn Knee pain Left  4. Mood: LCSW to follow for evaluation and support.  5. Neuropsych: This patient is not capable of making decisions on his  own behalf. Trial ritalin increase dose Cont lexapro 6. Skin/Wound  Care: routine pressure relief measures 7. Fluids/Electrolytes/Nutrition: Monitor I/Os--offer supplements if intake poor.   8.T2DM: monitor BS ac/hs. Continue lantus daily with meal coverage. Will use SSI for elevated BS.    CBG (last 3)   Recent Labs  03/02/17 1643 03/02/17 2050 03/03/17 0628  GLUCAP 145* 183* 96   Cont  lantus  35U,   Some lability but within desired range of 80-180 9. HTN:  Monitor BP bid  Norvasc  increased to 10mg  7/30  Changed flomax to Hytrin 8/3 increase to 4mg  BID on 8/9  Hydralazine 10 started 8/13, increased to 25 on 8/14, monitor response overall improving Vitals:   03/02/17 1526 03/03/17 0504  BP: (!) 143/77 (!) 153/59  Pulse: 93 97  Resp: 17 18  Temp: 98.7 F (37.1 C) 98.3 F (36.8 C)  SpO2: 100% 100%   10 H/o urinary retention: currently incontinent. Toilet patient every 4 hours and cath if retention noted.   11. Hyperlipidemia: On atorvastatin.  12. Question CKD: baseline SCr around 1.6 per records review. Continue to monitor.  Offer fluids between meals for adequate hydration.   Cr. 1.18 on 8/15  Labs ordered for tomorrow  Cont to monitor 13. Hyponatremia: Resolved   Sodium 139 on 8/15  Continue to monitor 14. Hypoalbuminemia  Supplement initiated 7/28 15. Acute blood loss anemia  Hemoglobin 10.6 on 8/15    Continue to monitor 16.  Spasticity- Left side , L hamstring most affected has daytime spasticity , Increased zanaflex to 2mg  BID and 4mg  Qhs,Botox 100 U to L hamstring on 8/5 17.  Urinary incont- occ retention now on Hytrin increase to 4mg  BID,     Urecholine 25mg QID, increased to 50 on 8/14 18.  Bowel incont   LOS (Days) 20 A FACE TO FACE EVALUATION WAS PERFORMED  KIRSTEINS,ANDREW E 03/03/2017 6:59 AM

## 2017-03-03 NOTE — Progress Notes (Signed)
Social Work Patient ID: Frank GeneralRaymond Moss, male   DOB: [birth year], 46 y.o.   MRN: [id number]  Spoke with wife to discuss team conference progress in therapies and target discharge still 8/24. She reports she  Has been trying to get quotes for a ramp and trying to find a caregiver to provide the care he will require at discharge. She wants a urologist consult due to his bladder issues that has been an issues since his stroke. Will ask MD to contact her to address this concern. Has emailed the home measurements and will get to [name]-PT and ask her to contact wife regarding her questions about obtaining a motorized wheelchair.  They may need a home evaluation prior to discharge to address the many concerns wife has. Tried to discuss having daughter come in for hands on education but never got an answer from wife if she still will be one of Pt's caregivers at discharge. Will continue to work on pt's discharge needs.

## 2017-03-03 NOTE — Progress Notes (Signed)
Occupational Therapy Session Note  Patient Details  Name: Frank Moss MRN: [id number] Date of Birth: [birth year]  Today's Date: 03/03/2017 OT Individual Time: 0802-0900 OT Individual Time Calculation (min): 58 min    Short Term Goals: Week 3:  OT Short Term Goal 1 (Week 3): Pt will initiate use of L UE during bathing tasks with min questioning cue 75% of tasks OT Short Term Goal 2 (Week 3): Pt will donn pull up pants with mod assist sit to stand.  OT Short Term Goal 3 (Week 3): Pt will complete toilet transfer with consistent Mod A  Skilled Therapeutic Interventions/Progress Updates:    OT treatment session focused on wc adjustments, functional use of L UE, sitting balance, and transfers. Pt greeted supine in bed with wc and wc cushion noted to be covered in stool. WC's arm rests were also twisted and positioned improperly. Pt set-up for bed level sponge bath while OT cleaned wc and cushion for pt safety and infection control. Pt required mod-max instructional cues to initiate bathing with L UE in quiet environment. Pt then came to sitting EOB with min A and worked on sitting balance and postural control while donning deodorant and shirt with min-mod A for sitting balance. Pt then completed stand-pivot to wc with 2 trials and mod A. Pt able to power up, but requires mod/max facilitation to initiate pivot. Pt left seated in wc at end of session with needs met and call bell in reach.   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   Therapy/Group: Individual Therapy  Valma Cava 03/03/2017, 9:03 AM

## 2017-03-03 NOTE — Progress Notes (Signed)
Social Work Patient ID: [name], male   DOB: 1971-04-10, 46 y.o.   MRN: [id number]  Have attempted to contact wife via work phone and personal cell phone without success. Have left messages for Her, will await return call, to discuss team conference and discharge plan.

## 2017-03-03 NOTE — Progress Notes (Signed)
Speech Language Pathology Daily Session Note  Patient Details  Name: Frank Moss MRN: [id number] Date of Birth: [birth year]  Today's Date: 03/03/2017 SLP Individual Time: 1330-1430 SLP Individual Time Calculation (min): 60 min  Short Term Goals: Week 3: SLP Short Term Goal 1 (Week 3): Pt will sustain his attention to basic, familiar tasks for 10 minutes with mod assist verbal cues for redirection.   SLP Short Term Goal 2 (Week 3): Pt will utilize external aids to recall basic, daily information with min assist verbal cues.   SLP Short Term Goal 3 (Week 3): Pt will complete basic, familiar tasks with mod assist verbal cues for functional problem solving.   SLP Short Term Goal 4 (Week 3): Pt will attend/scan to left field of enviornment/body during functional tasks with Min A verbal cues.   Skilled Therapeutic Interventions: Skilled treatment session focused on cognitive goals. SLP facilitated session by providing Max A verbal cues for problem solving and organization during a mildly complex medication management task in regards to organizing a 4 time per day pill box. Patient independently recalled 40% of his medications and required Max A verbal cues for selective attention to task in a mildly distracting environment for ~60 second intervals. Patient left upright in wheelchair with all needs within reach. Continue with current plan of care.       Function:   Cognition Comprehension Comprehension assist level: Understands basic 90% of the time/cues < 10% of the time;Understands basic 75 - 89% of the time/ requires cueing 10 - 24% of the time  Expression   Expression assist level: Expresses basic 75 - 89% of the time/requires cueing 10 - 24% of the time. Needs helper to occlude trach/needs to repeat words.  Social Interaction Social Interaction assist level: Interacts appropriately 75 - 89% of the time - Needs redirection for appropriate language or to initiate interaction.  Problem Solving  Problem solving assist level: Solves basic 25 - 49% of the time - needs direction more than half the time to initiate, plan or complete simple activities  Memory Memory assist level: Recognizes or recalls 25 - 49% of the time/requires cueing 50 - 75% of the time    Pain No/Denies Pain   Therapy/Group: Individual Therapy  Tuleen Mandelbaum 03/03/2017, 3:16 PM

## 2017-03-03 NOTE — Progress Notes (Signed)
Physical Therapy Session Note  Patient Details  Name: Frank Moss MRN: [id number] Date of Birth: [birth year]  Today's Date: 03/03/2017 PT Individual Time: 1000-1115 PT Individual Time Calculation (min): 75 min   Short Term Goals: Week 1:  PT Short Term Goal 1 (Week 1): Pt will perform bed mobility with max assist of 1 consistently  PT Short Term Goal 1 - Progress (Week 1): Met PT Short Term Goal 2 (Week 1): Pt Will perform squat pivot transfer with total assist of 1 consistently  PT Short Term Goal 2 - Progress (Week 1): Met PT Short Term Goal 3 (Week 1): Pt will initate WC mobility  PT Short Term Goal 3 - Progress (Week 1): Not met PT Short Term Goal 4 (Week 1): Pt will perform sit<>stand with max assist of one consistently  PT Short Term Goal 4 - Progress (Week 1): Met  Skilled Therapeutic Interventions/Progress Updates:    no c/o pain, except occasional pain with L knee/hip flexion.  Session focus on attention, initiation, and transfers.    Blocked practice transfers R and L with yellow dysem for visual target.  Pt able to perform stand/pivot to the R with visual target and min guard with increased time.  Stand/pivot to the L with mod assist and multimodal cues for motor planning and sequencing pivot.    Sit>supine>side lying R with max>total assist for motor planning.  NMR for LUE and visual scanning in R side lying during peg board task.  Pt took 1 rest break during peg board task 2/2 mental fatigue.    Gait with rail in hallway on R side, min assist for sit>stand and min assist during gait to facilitate weight shift. Mod multimodal cues for pacing and posture.  Pt returned to room at end of session and positioned in w/c with QRB in place, call bell in reach and needs met.   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   Therapy/Group: Individual Therapy  Earnest Conroy Penven-Crew 03/03/2017, 12:33 PM

## 2017-03-03 NOTE — Consult Note (Signed)
Urology Consult  Referring physician: Dr. Letta Pate Reason for referral: urinary retention  Chief Complaint: Urinary urgency and retention  History of Present Illness: Frank Moss is a 46yo with a hx of CVA, DMII who developed urinary retention followup CVA on 01/21/2017. No prior hx of urinary retention or lower urinary tract symptoms prior to his CVA. His retention was intially managed with indwelling foley but was removed 5 days ago and he is managed with CIC currently. He has occasional urgency but has bee unable to urinate. No urinary incontinence. No dysuria or hematuria. He was started on flomax and bethenachol for his urinary retention which has failed to improve his urinary retention. He also has associated constipation  Past Medical History:  Diagnosis Date  . Abnormal ECG   . Acute ischemic right ACA stroke ([place])   . Coronary artery disease, occlusive   . Dysfunction of eustachian tube   . History of right ACA stroke   . Hyperlipidemia associated with type 2 diabetes mellitus ([place])   . Hypertension   . Impotence of organic origin   . Lumbago   . Lymphadenitis    Unspecified, except mesenteric  . Type 2 diabetes, uncontrolled, with retinopathy ([place])   . Urinary retention 01/2017   Past Surgical History:  Procedure Laterality Date  . TEE WITHOUT CARDIOVERSION N/A 02/11/2017   Procedure: TRANSESOPHAGEAL ECHOCARDIOGRAM (TEE);  Surgeon: Sueanne Margarita, MD;  Location: [place];  Service: Cardiovascular;  Laterality: N/A;  . TEE WITHOUT CARDIOVERSION N/A 02/11/2017   Procedure: TRANSESOPHAGEAL ECHOCARDIOGRAM (TEE);  Surgeon: Sueanne Margarita, MD;  Location: [facility] ENDOSCOPY;  Service: Cardiovascular;  Laterality: N/A;    Medications: I have reviewed the patient's current medications. Allergies:  Allergies  Allergen Reactions  . Beta Adrenergic Blockers Other (See Comments)    "Fatigue," but this isn't noted on the patient's MAR    Family History  Problem Relation Age of Onset  .  Hypertension Mother   . Depression Mother   . Cancer Neg Hx   . Diabetes Neg Hx   . Stroke Neg Hx    Social History:  reports that he has never smoked. He has never used smokeless tobacco. He reports that he does not drink alcohol or use drugs.  Review of Systems  Eyes: Positive for blurred vision.  Genitourinary: Positive for urgency.  Neurological: Positive for weakness.  All other systems reviewed and are negative.   Physical Exam:  Vital signs in last 24 hours: Temp:  [98.3 F (36.8 C)] 98.3 F (36.8 C) (08/16 1430) Pulse Rate:  [97-98] 98 (08/16 1430) Resp:  [16-18] 16 (08/16 1430) BP: (153-154)/(59-75) 154/75 (08/16 1430) SpO2:  [99 %-100 %] 99 % (08/16 1430) Physical Exam  Constitutional: He is oriented to person, place, and time. He appears well-developed and well-nourished.  HENT:  Head: Normocephalic and atraumatic.  Eyes: Pupils are equal, round, and reactive to light. EOM are normal.  Neck: Normal range of motion. No thyromegaly present.  Cardiovascular: Normal rate and regular rhythm.   Respiratory: Effort normal. No respiratory distress.  GI: Soft. He exhibits no distension. Hernia confirmed negative in the right inguinal area and confirmed negative in the left inguinal area.  Genitourinary: Rectum normal, testes normal and penis normal. Rectal exam shows anal tone normal. Prostate is enlarged. Prostate is not tender. Right testis shows no mass, no swelling and no tenderness. Right testis is descended. Cremasteric reflex is not absent on the right side. Left testis shows no mass, no swelling and  no tenderness. Left testis is descended. Cremasteric reflex is not absent on the left side. Uncircumcised. No phimosis or penile tenderness.  Musculoskeletal: Normal range of motion. He exhibits no edema.  Lymphadenopathy:       Right: No inguinal adenopathy present.       Left: No inguinal adenopathy present.  Neurological: He is alert and oriented to person, place, and  time.  Skin: Skin is warm and dry.  Psychiatric: He has a normal mood and affect. His behavior is normal. Judgment and thought content normal.    Laboratory Data:  Results for orders placed or performed during the hospital encounter of 02/11/17 (from the past 72 hour(s))  Glucose, capillary     Status: None   Collection Time: 03/01/17  6:18 AM  Result Value Ref Range   Glucose-Capillary 89 65 - 99 mg/dL  Glucose, capillary     Status: Abnormal   Collection Time: 03/01/17 11:46 AM  Result Value Ref Range   Glucose-Capillary 137 (H) 65 - 99 mg/dL  Glucose, capillary     Status: Abnormal   Collection Time: 03/01/17  4:34 PM  Result Value Ref Range   Glucose-Capillary 172 (H) 65 - 99 mg/dL  Glucose, capillary     Status: Abnormal   Collection Time: 03/01/17  9:29 PM  Result Value Ref Range   Glucose-Capillary 149 (H) 65 - 99 mg/dL  Basic metabolic panel     Status: Abnormal   Collection Time: 03/02/17  4:48 AM  Result Value Ref Range   Sodium 139 135 - 145 mmol/L   Potassium 4.0 3.5 - 5.1 mmol/L   Chloride 102 101 - 111 mmol/L   CO2 30 22 - 32 mmol/L   Glucose, Bld 89 65 - 99 mg/dL   BUN 23 (H) 6 - 20 mg/dL   Creatinine, Ser 1.18 0.61 - 1.24 mg/dL   Calcium 9.0 8.9 - 10.3 mg/dL   GFR calc non Af Amer >60 >60 mL/min   GFR calc Af Amer >60 >60 mL/min    Comment: (NOTE) The eGFR has been calculated using the CKD EPI equation. This calculation has not been validated in all clinical situations. eGFR's persistently <60 mL/min signify possible Chronic Kidney Disease.    Anion gap 7 5 - 15  CBC     Status: Abnormal   Collection Time: 03/02/17  4:48 AM  Result Value Ref Range   WBC 4.8 4.0 - 10.5 K/uL   RBC 3.93 (L) 4.22 - 5.81 MIL/uL   Hemoglobin 10.6 (L) 13.0 - 17.0 g/dL   HCT 31.6 (L) 39.0 - 52.0 %   MCV 80.4 78.0 - 100.0 fL   MCH 27.0 26.0 - 34.0 pg   MCHC 33.5 30.0 - 36.0 g/dL   RDW 13.9 11.5 - 15.5 %   Platelets 261 150 - 400 K/uL  Glucose, capillary     Status: None    Collection Time: 03/02/17  6:27 AM  Result Value Ref Range   Glucose-Capillary 88 65 - 99 mg/dL  Glucose, capillary     Status: Abnormal   Collection Time: 03/02/17 12:22 PM  Result Value Ref Range   Glucose-Capillary 112 (H) 65 - 99 mg/dL  Glucose, capillary     Status: Abnormal   Collection Time: 03/02/17  4:43 PM  Result Value Ref Range   Glucose-Capillary 145 (H) 65 - 99 mg/dL  Glucose, capillary     Status: Abnormal   Collection Time: 03/02/17  8:50 PM  Result Value Ref Range  Glucose-Capillary 183 (H) 65 - 99 mg/dL  Glucose, capillary     Status: None   Collection Time: 03/03/17  6:28 AM  Result Value Ref Range   Glucose-Capillary 96 65 - 99 mg/dL  Glucose, capillary     Status: Abnormal   Collection Time: 03/03/17 11:39 AM  Result Value Ref Range   Glucose-Capillary 101 (H) 65 - 99 mg/dL  Glucose, capillary     Status: Abnormal   Collection Time: 03/03/17  4:44 PM  Result Value Ref Range   Glucose-Capillary 130 (H) 65 - 99 mg/dL  Glucose, capillary     Status: Abnormal   Collection Time: 03/03/17  9:59 PM  Result Value Ref Range   Glucose-Capillary 104 (H) 65 - 99 mg/dL   No results found for this or any previous visit (from the past 240 hour(s)). Creatinine:  Recent Labs  03/02/17 0448  CREATININE 1.18   Baseline Creatinine: 1.2  Impression/Assessment:  46yo with BPh with urinary retention, recent CVA  Plan:  1. I discussed the various causes of urinary retention after CVA with the patient and his wife. I also discussed the treatment of BPH including alpha-blocker therapy and 5ARI therapy. His urinary retention is a combination of BPH, constipation and recent CVA. I discussed placing an indwelling foley in this patient since he and his wife are unable to reliably perform CIC in the patient. I discussed starting flomax and the patient and wife agreed. He will followup in 1 month in my office for a voiding trial  Nicolette Bang 03/03/2017, 10:21 PM

## 2017-03-04 ENCOUNTER — Inpatient Hospital Stay (HOSPITAL_COMMUNITY): Payer: 59 | Admitting: Physical Therapy

## 2017-03-04 ENCOUNTER — Inpatient Hospital Stay (HOSPITAL_COMMUNITY): Payer: 59 | Admitting: Speech Pathology

## 2017-03-04 ENCOUNTER — Inpatient Hospital Stay (HOSPITAL_COMMUNITY): Payer: 59 | Admitting: Occupational Therapy

## 2017-03-04 LAB — GLUCOSE, CAPILLARY
GLUCOSE-CAPILLARY: 116 mg/dL — AB (ref 65–99)
Glucose-Capillary: 85 mg/dL (ref 65–99)
Glucose-Capillary: 104 mg/dL — ABNORMAL HIGH (ref 65–99)
Glucose-Capillary: 148 mg/dL — ABNORMAL HIGH (ref 65–99)

## 2017-03-04 MED ORDER — HYDRALAZINE HCL 25 MG PO TABS
25.0000 mg | ORAL_TABLET | Freq: Four times a day (QID) | ORAL | Status: DC
  Administered 2017-03-04 – 2017-03-07 (×11): 25 mg via ORAL
  Filled 2017-03-04 (×12): qty 1

## 2017-03-04 NOTE — Plan of Care (Signed)
Problem: RH Balance Goal: LTG: Patient will maintain dynamic sitting balance (OT) LTG:  Patient will maintain dynamic sitting balance with assistance during activities of daily living (OT)  Goal downgraded 8/17 ESD Goal: LTG Patient will maintain dynamic standing with ADLs (OT) LTG:  Patient will maintain dynamic standing balance with assist during activities of daily living (OT)   Goal downgraded 8/17 ESD  Problem: RH Dressing Goal: LTG Patient will perform upper body dressing (OT) LTG Patient will perform upper body dressing with assist, with/without cues (OT).  Goal downgraded 8/17 ESD  Problem: RH Toileting Goal: LTG Patient will perform toileting w/assist, cues/equip (OT) LTG: Patient will perform toiletiing (clothes management/hygiene) with assist, with/without cues using equipment (OT)  Goal downgraded 8/17 ESD  Problem: RH Toilet Transfers Goal: LTG Patient will perform toilet transfers w/assist (OT) LTG: Patient will perform toilet transfers with assist, with/without cues using equipment (OT)  Goal downgraded 8/17 ESD  Problem: RH Tub/Shower Transfers Goal: LTG Patient will perform tub/shower transfers w/assist (OT) LTG: Patient will perform tub/shower transfers with assist, with/without cues using equipment (OT)  Goal downgraded 8/17 ESD  Comments: Goal downgraded 8/17 ESD

## 2017-03-04 NOTE — Progress Notes (Signed)
Occupational Therapy Weekly Progress Note  Patient Details  Name: Frank Moss MRN: [id number] Date of Birth: [birth year]  Beginning of progress report period: February 12, 2017 End of progress report period: March 04, 2017  Today's Date: 03/04/2017 OT Individual Time: 0803-0900 OT Individual Time Calculation (min): 57 min    Patient has met 2 of 3 short term goals.  Pt is making slow progress towards OT goals at this time. His functional transfers can be min A to R, but then Mod-Max A to L with difficulty motor planning any transitional movements 2/2 apraxia. Pts L LE pain and spasticity is also limiting pt's progress with transfers and standing ADLs. Pt has demonstrated some improvement this week in initiating use of L UE.   Patient continues to demonstrate the following deficits: muscle weakness, impaired timing and sequencing, abnormal tone, unbalanced muscle activation, motor apraxia, ataxia, decreased coordination and decreased motor planning, decreased midline orientation, decreased attention to left, left side neglect and decreased motor planning, decreased initiation, decreased attention, decreased awareness, decreased problem solving, decreased safety awareness, decreased memory and delayed processing and decreased sitting balance, decreased standing balance, decreased postural control, hemiplegia and decreased balance strategies and therefore will continue to benefit from skilled OT intervention to enhance overall performance with Reduce care partner burden.  Patient not progressing toward long term goals.  See goal revision..  Plan of care revisions: Transfer goals downgraded to Mod A.  OT Short Term Goals Week 3:  OT Short Term Goal 1 (Week 3): Pt will initiate use of L UE during bathing tasks with min questioning cue 75% of tasks OT Short Term Goal 1 - Progress (Week 3): Met OT Short Term Goal 2 (Week 3): Pt will donn pull up pants with mod assist sit to stand.  OT Short Term Goal 2  - Progress (Week 3): Met OT Short Term Goal 3 (Week 3): Pt will complete toilet transfer with consistent Mod A OT Short Term Goal 3 - Progress (Week 3): Progressing toward goal Week 4:  OT Short Term Goal 1 (Week 4): LTG=STG 2/2 ELOS  Skilled Therapeutic Interventions/Progress Updates:    Pt incontinent of bladder upon OT arrival with nurse preset. Assisted with bed mobility rolling L and R for peri-care and brief change. Pt needed hand over hand and max A to roll to the R, but only min A to roll L. Pt came to sitting EOB w/ min A. Addressed sitting balance while doffing shirt with Min A and verbal cues for anterior weight shift. Stedy used to transfer pt onto tub bench with Min/mod A and increased time + multimodal cues to initiate stand. Bathing completed with focus on B UE coordination and integrating L UE into bathing tasks. Pt with smear of BM when OT washing buttocks, but no full BM. Stedy used to transfer pt to wc in similar fashion as above. Pt initiated donning deodorant under R UE using L UE today. Pt had difficulty threading LLE 2/2 pain and spasticity, with min A to maintain sitting balance when threading R pant leg. Pt with difficulty orienting shirt, requiring assistance to thread LUE and instructional cues to complete 3/4 steps of donning shirt. Pt left tilted in wc at end of session with breakfast tray set-up.   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   Therapy/Group: Individual Therapy  Daneen Schick Lew Prout  03/04/2017, 1:00 PM

## 2017-03-04 NOTE — Plan of Care (Signed)
Problem: RH KNOWLEDGE DEFICIT Goal: RH STG INCREASE KNOWLEDGE OF DIABETES Patient and family will demonstrate increased knowledge of diabetes and associated medications, s/s of hypo/hyperglycemia, dietary restrictions, and follow-up with the MD post discharge with resources/reminders.   Outcome: Not Progressing Pt unable to demonstrate learning. Family has not been present recently to assess level of learning. Goal: RH STG INCREASE KNOWLEDGE OF HYPERTENSION Patient and family with demonstrate knowledge of HTN medications and dietary restrictions at discharge with reminders and resources.   Outcome: Not Progressing Pt unable to demonstrate learning. Family has not been present recently to assess level of learning.

## 2017-03-04 NOTE — Progress Notes (Signed)
[facility] PHYSICAL MEDICINE & REHABILITATION     PROGRESS NOTE  Subjective/Complaints:  Appreciate urology consult  Pt awake this am  ROS: Denies CP, SOB, nausea, vomiting, diarrhea.  Objective: Vital Signs: Blood pressure (!) 172/78, pulse (!) 103, temperature 98.5 F (36.9 C), temperature source Oral, resp. rate 14, height 6\' 2"  (1.88 m), weight 103 kg (227 lb 0.7 oz), SpO2 96 %. No results found.  Recent Labs  03/02/17 0448  WBC 4.8  HGB 10.6*  HCT 31.6*  PLT 261    Recent Labs  03/02/17 0448  NA 139  K 4.0  CL 102  GLUCOSE 89  BUN 23*  CREATININE 1.18  CALCIUM 9.0   CBG (last 3)   Recent Labs  03/03/17 1644 03/03/17 2159 03/04/17 0600  GLUCAP 130* 104* 85    Wt Readings from Last 3 Encounters:  03/02/17 103 kg (227 lb 0.7 oz)  02/08/17 103.7 kg (228 lb 11.2 oz)  02/08/17 124.3 kg (274 lb)    Physical Exam:  BP (!) 172/78 (BP Location: Right Arm)   Pulse (!) 103   Temp 98.5 F (36.9 C) (Oral)   Resp 14   Ht 6\' 2"  (1.88 m)   Wt 103 kg (227 lb 0.7 oz)   SpO2 96%   BMI 29.15 kg/m  Constitutional: He appears well-developedand well-nourished.  HENT: Normocephalicand atraumatic.  Eyes: EOMI. No discharge. Cardiovascular: RRR. No JVD. Respiratory: Effort normal breath sounds normal.  GI: Soft. Bowel sounds are normal.   Neurological: He is alertand oriented.  Left facial weakness with left gaze preference and right neglect, improving.  Delayed processing with slow speech.  Lacks awareness/insight into deficits. Spastic left hemiparesis  Motor: Left lower extremity: 2/5 proximal to distal Moving LUE spontaneously to do tasks, but not to command.  Skin: Skin is warmand dry.  Psychiatric: His affect is blunt. His speech is delayed. He is slowed. He expresses inappropriate judgment. He is inattentive.    Assessment/Plan: 1. 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, small right thalamic infarct which require 3+ hours per day of  interdisciplinary therapy in a comprehensive inpatient rehab setting. Physiatrist is providing close team supervision and 24 hour management of active medical problems listed below. Physiatrist and rehab team continue to assess barriers to discharge/monitor patient progress toward functional and medical goals.  Function:  Bathing Bathing position   Position: Wheelchair/chair at sink  Bathing parts Body parts bathed by patient: Right arm, Left arm, Abdomen, Chest, Front perineal area, Right upper leg, Left upper leg, Buttocks, Right lower leg Body parts bathed by helper: Left lower leg, Back  Bathing assist Assist Level: Touching or steadying assistance(Pt > 75%)      Upper Body Dressing/Undressing Upper body dressing   What is the patient wearing?: Pull over shirt/dress     Pull over shirt/dress - Perfomed by patient: Thread/unthread right sleeve, Put head through opening, Pull shirt over trunk Pull over shirt/dress - Perfomed by helper: Thread/unthread left sleeve        Upper body assist Assist Level: Touching or steadying assistance(Pt > 75%)      Lower Body Dressing/Undressing Lower body dressing   What is the patient wearing?: Non-skid slipper socks, Pants   Underwear - Performed by helper: Thread/unthread right underwear leg, Thread/unthread left underwear leg, Pull underwear up/down Pants- Performed by patient: Thread/unthread right pants leg Pants- Performed by helper: Pull pants up/down, Thread/unthread left pants leg Non-skid slipper socks- Performed by patient: Don/doff right sock Non-skid slipper  socks- Performed by helper: Don/doff left sock       Shoes - Performed by helper: Don/doff right shoe, Don/doff left shoe, Fasten right, Fasten left          Lower body assist Assist for lower body dressing: Touching or steadying assistance (Pt > 75%)      Toileting Toileting     Toileting steps completed by helper: Performs perineal hygiene, Adjust clothing  prior to toileting, Adjust clothing after toileting Toileting Assistive Devices: Grab bar or rail  Toileting assist Assist level: Two helpers   Transfers Chair/bed transfer   Chair/bed transfer method: Stand pivot Chair/bed transfer assist level: Moderate assist (Pt 50 - 74%/lift or lower) Chair/bed transfer assistive device: Mechanical lift Mechanical lift: Stedy   Locomotion Ambulation Ambulation activity did not occur: Safety/medical concerns   Max distance: 30 Assist level: Touching or steadying assistance (Pt > 75%)   Wheelchair Wheelchair activity did not occur: Safety/medical concerns Type: Manual      Cognition Comprehension Comprehension assist level: Understands basic 90% of the time/cues < 10% of the time, Understands basic 75 - 89% of the time/ requires cueing 10 - 24% of the time  Expression Expression assist level: Expresses basic 75 - 89% of the time/requires cueing 10 - 24% of the time. Needs helper to occlude trach/needs to repeat words.  Social Interaction Social Interaction assist level: Interacts appropriately 75 - 89% of the time - Needs redirection for appropriate language or to initiate interaction.  Problem Solving Problem solving assist level: Solves basic 25 - 49% of the time - needs direction more than half the time to initiate, plan or complete simple activities  Memory Memory assist level: Recognizes or recalls 25 - 49% of the time/requires cueing 50 - 75% of the time    Medical Problem List and Plan: 1.  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  DVT Prophylaxis/Anticoagulation: Pharmaceutical: Lovenox 3. H/o lumbago/Pain Management: Denies pain at this moment. Tylenol prn Knee pain Left  4. Mood: LCSW to follow for evaluation and support.   5. Neuropsych: This patient is not capable of making decisions on his  own behalf. Trial ritalin increase dose Cont lexapro 6. Skin/Wound Care: routine pressure relief measures 7. Fluids/Electrolytes/Nutrition: Monitor I/Os--offer supplements if intake poor.   8.T2DM: monitor BS ac/hs. Continue lantus daily with meal coverage. Will use SSI for elevated BS.    CBG (last 3)   Recent Labs  03/03/17 1644 03/03/17 2159 03/04/17 0600  GLUCAP 130* 104* 85   Cont  lantus  35U,   Some lability but within desired range of 80-180 9. HTN:  Monitor BP bid  Norvasc  increased to 10mg  7/30  Changed Hytrin back to flomax again will need to increase other meds  Hydralazine 10 started 8/13, increased to 25 TID on 8/14,will increase to QID Vitals:   03/03/17 1430 03/04/17 0600  BP: (!) 154/75 (!) 172/78  Pulse: 98 (!) 103  Resp: 16 14  Temp: 98.3 F (36.8 C) 98.5 F (36.9 C)  SpO2: 99% 96%   10 H/o urinary retention: currently incontinent. Toilet patient every 4 hours and cath if retention noted.   11. Hyperlipidemia: On atorvastatin.  12. Question CKD: baseline SCr around 1.6 per records review. Continue to monitor.  Offer fluids between meals for adequate hydration.  Cr. 1.18 on 8/15  Labs ordered for tomorrow  Cont to monitor 13. Hyponatremia: Resolved   Sodium 139 on 8/15  Continue to monitor 14. Hypoalbuminemia  Supplement initiated 7/28 15. Acute blood loss anemia  Hemoglobin 10.6 on 8/15    Continue to monitor 16.  Spasticity- Left side , L hamstring most affected has daytime spasticity , Increased zanaflex to 2mg  BID and 4mg  Qhs,Botox 100 U to L hamstring on 8/5 17.  Urinary incont- occ retention now on Hytrin increase to 4mg  BID,   Previously failed flomax and place on hytrin to have a greater BP lowering effect, since urology recommending flomax will restart and adjust BP meds   Urecholine 25mg QID, increased to 50 on 8/14, no effect, given rec for foley will d/c  18.  Bowel  incont cognitive factors , daily soft stools   LOS (Days) 21 A FACE TO FACE EVALUATION WAS PERFORMED  Erick Colace 03/04/2017 6:57 AM

## 2017-03-04 NOTE — Plan of Care (Signed)
Problem: RH Tub/Shower Transfers Goal: LTG Patient will perform tub/shower transfers w/assist (OT) LTG: Patient will perform tub/shower transfers with assist, with/without cues using equipment (OT)  Outcome: Not Applicable Date Met: 47/84/12 D/c goal 2/2 pt unable to get to upstairs tub shower at dc- ESD 8/17

## 2017-03-04 NOTE — Progress Notes (Addendum)
Social Work Patient ID: Frank Moss, male   DOB: 14-Sep-1970, 46 y.o.   MRN: [id number]  Received email from wife to inform of her needs with obtaining a stair lift and wheelchair ramp and try to insurance coverage For this. Have informed her insurance does not cover stair lifts or ramps for the home. Have started referral process for home health and equipment-hospital bed and drop-arm bedside commode, will follow up with Wheelchair needs. [name]-PT to talk with [facility] to schedule wheelchair evaluation for speciality chair. Have asked wife who will be the caregivers and that we will need them to come in for training prior to discharge To make sure all are comfortable and prepared for pt being home. Wife was appreciative of the Urology consult. Will continue to try to get the caregiver in for education and work on pt's discharge needs. Have asked wife and daughter along with daughter's partner to come in and attend therapies with pt, to begin learning pt's care.

## 2017-03-04 NOTE — Progress Notes (Signed)
Physical Therapy Session Note  Patient Details  Name: Frank Moss MRN: [id number] Date of Birth: 07/25/1970  Today's Date: 03/04/2017 PT Individual Time: 1003-1105 PT Individual Time Calculation (min): 62 min   Short Term Goals: Week 3:  PT Short Term Goal 1 (Week 3): Pt will tolerate standing upright with weight bearing through L LE x 3 min without UE support to aid in ability to assist in ADL's. PT Short Term Goal 2 (Week 3): Pt will tolerate sitting in standard wheelchair without supervision x 3 hours. PT Short Term Goal 3 (Week 3): Pt will ambulate 5 ft with +1 assistance PT Short Term Goal 4 (Week 3): W/C mobility to be initiated in standard w/c.  Skilled Therapeutic Interventions/Progress Updates:    no c/o pain.  Session focus on transfers w/c<>car and w/c<>w/c, and power w/c propulsion.    Pt completes car transfer with max assist for motor planning, (mod assist overall for actual transfer).  Pt with significant LLE pain with hip/knee flexion into and out of car, causing pt to attempt to stand up inside of the car, requiring total assist +2 to redirect for safe transfer.    Power w/c trial for appropriateness at d/c.  Pt requires max>total cuing to be able to propel w/c 50' without colliding into walls/obstacles on R and L side.  Feel at this time that due to pt's significant cognitive and perceptual deficits, pt unsafe to pursue power mobility and recommend tilt in space chair for safety and out of bed mobility.    Power w/c>tilt in space w/c transfer to R side with max assist for motor planning, pt hesitant to bear weight through LLE this session.  Returned to room in tilt in space, QRB in place, call bell in reach.    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   Therapy/Group: Individual Therapy  Ladora Daniel Penven-Crew 03/04/2017, 12:09 PM

## 2017-03-04 NOTE — Progress Notes (Signed)
Speech Language Pathology Weekly Progress and Session Note  Patient Details  Name: Frank Moss MRN: [id number] Date of Birth: 1971/03/21  Beginning of progress report period: February 25, 2017 End of progress report period: March 04, 2017  Today's Date: 03/04/2017 SLP Individual Time: 1300-1340 SLP Individual Time Calculation (min): 40 min  Short Term Goals: Week 3: SLP Short Term Goal 1 (Week 3): Pt will sustain his attention to basic, familiar tasks for 10 minutes with mod assist verbal cues for redirection.   SLP Short Term Goal 1 - Progress (Week 3): Not met SLP Short Term Goal 2 (Week 3): Pt will utilize external aids to recall basic, daily information with min assist verbal cues.   SLP Short Term Goal 2 - Progress (Week 3): Not met SLP Short Term Goal 3 (Week 3): Pt will complete basic, familiar tasks with mod assist verbal cues for functional problem solving.   SLP Short Term Goal 3 - Progress (Week 3): Not met SLP Short Term Goal 4 (Week 3): Pt will attend/scan to left field of enviornment/body during functional tasks with Min A verbal cues.  SLP Short Term Goal 4 - Progress (Week 3): Not met    New Short Term Goals: Week 4: SLP Short Term Goal 1 (Week 4): STGs=LTGs  Weekly Progress Updates: Patient has made minimal gains and has not met any STG's this reporting period. Currently, patient requires overall Max A verbal cues to complete functional and familiar tasks safely in regards to problem solving, awareness, recall, and attention. Due to patient's minimal progress, patient's LTGs has been downgraded. Patient and family education is ongoing with plans for patient to discharge home with 24 hour supervision. However, family needs to participate in family education to determine if they are realistically able to provide the level of cognitive and physical care he will need at discharge. Patient would benefit from continued skilled SLP intervention to maximize his cognitive function  and overall functional independence prior to discharge.      Intensity: Minumum of 1-2 x/day, 30 to 90 minutes Frequency: 3 to 5 out of 7 days Duration/Length of Stay: 03/11/17 Treatment/Interventions: Cognitive remediation/compensation;Cueing hierarchy;Environmental controls;Functional tasks;Internal/external aids;Patient/family education;Therapeutic Activities   Daily Session  Skilled Therapeutic Interventions: Skilled treatment session focused on cognitive goals. Upon arrival, patient had been incontinent of urine. Patient reported he has sensation when he needs to void but does not call for assistance due to feeling like he can get himself to the toilet. Therefore, SLP provided total A for emergent awareness of deficits and their impact on functional tasks. Patient required Max-Total A for problem solving and following commands during bed mobility and Max A verbal cues for sustained attention to tasks. Patient left upright in bed with all needs within reach. Continue with current plan of care.       Function:   Cognition Comprehension Comprehension assist level: Understands basic 90% of the time/cues < 10% of the time;Understands basic 75 - 89% of the time/ requires cueing 10 - 24% of the time  Expression   Expression assist level: Expresses basic 75 - 89% of the time/requires cueing 10 - 24% of the time. Needs helper to occlude trach/needs to repeat words.  Social Interaction Social Interaction assist level: Interacts appropriately 75 - 89% of the time - Needs redirection for appropriate language or to initiate interaction.  Problem Solving Problem solving assist level: Solves basic 25 - 49% of the time - needs direction more than half the time to initiate, plan  or complete simple activities  Memory Memory assist level: Recognizes or recalls 25 - 49% of the time/requires cueing 50 - 75% of the time   General    Pain Pain Assessment Pain Assessment: No/denies pain  Therapy/Group:  Individual Therapy  Frank Moss 03/04/2017, 3:09 PM

## 2017-03-04 NOTE — Plan of Care (Signed)
Problem: RH BOWEL ELIMINATION Goal: RH STG MANAGE BOWEL WITH ASSISTANCE STG Manage Bowel with Moderate Assistance to [facility] or bathroom.    Outcome: Not Progressing Pt. Incontinent of bowel movement.

## 2017-03-04 NOTE — Plan of Care (Signed)
Problem: RH Balance Goal: LTG Patient will maintain dynamic standing balance (PT) LTG:  Patient will maintain dynamic standing balance with assistance during mobility activities (PT)  Outcome: Not Applicable Date Met: 54/56/25 D/C 8/17 due to not a focus at this time   Problem: RH Bed Mobility Goal: LTG Patient will perform bed mobility with assist (PT) LTG: Patient will perform bed mobility with assistance, with/without cues (PT).  Downgraded 8/17 due to pt progress  Problem: RH Bed to Chair Transfers Goal: LTG Patient will perform bed/chair transfers w/assist (PT) LTG: Patient will perform bed/chair transfers with assistance, with/without cues (PT).  Downgraded 8/17 due to pt progress  Problem: RH Car Transfers Goal: LTG Patient will perform car transfers with assist (PT) LTG: Patient will perform car transfers with assistance (PT).  Downgraded 8/17 due to pt progress  Problem: RH Wheelchair Mobility Goal: LTG Patient will propel w/c in controlled environment (PT) LTG: Patient will propel wheelchair in controlled environment, # of feet with assist (PT)  Outcome: Not Applicable Date Met: 63/89/37 D/c 8/17 due to not a focus at this time  Goal: LTG Patient will propel w/c in home environment (PT) LTG: Patient will propel wheelchair in home environment, # of feet with assistance (PT).  Outcome: Not Applicable Date Met: 34/28/76 D/c due to not a focus at this time.   Problem: RH Stairs Goal: LTG Patient will ambulate up and down stairs w/assist (PT) LTG: Patient will ambulate up and down # of stairs with assistance (PT)  Outcome: Not Applicable Date Met: 81/15/72 D/c due to not a focus at this time

## 2017-03-04 NOTE — Progress Notes (Signed)
Physical Therapy Weekly Progress Note  Patient Details  Name: Rivaan Kendall MRN: [id number] Date of Birth: 15-May-1971  Beginning of progress report period: February 25, 2017 End of progress report period: March 04, 2017  Today's Date: 03/04/2017 PT Individual Time: 1415-1445 PT Individual Time Calculation (min): 30 min   Patient has met 1 of 4 short term goals.  Pt continues to make very slow progress with therapy.  He is mostly limited by profound cognitive deficits affecting attention and awareness, in addition to significant deficits in mobility, perception, and motor planning.    Patient continues to demonstrate the following deficits muscle weakness and muscle joint tightness, impaired timing and sequencing, abnormal tone, unbalanced muscle activation, motor apraxia, ataxia, decreased coordination and decreased motor planning, decreased visual acuity, decreased visual perceptual skills and decreased visual motor skills, decreased attention to left, left side neglect and ideational apraxia, decreased initiation, decreased attention, decreased awareness, decreased problem solving, decreased safety awareness, decreased memory and delayed processing and decreased sitting balance, decreased standing balance, decreased postural control, hemiplegia and decreased balance strategies and therefore will continue to benefit from skilled PT intervention to increase functional independence with mobility.  Patient not progressing toward long term goals.  See goal revision..  Plan of care revisions: downgraded to focus on transfers and bed mobility, mod assist overall, w/c level.  Do not expect pt will be able to self propel.  PT Short Term Goals Week 3:  PT Short Term Goal 1 (Week 3): Pt will tolerate standing upright with weight bearing through L LE x 3 min without UE support to aid in ability to assist in ADL's. PT Short Term Goal 1 - Progress (Week 3): Not met PT Short Term Goal 2 (Week 3): Pt will  tolerate sitting in standard wheelchair without supervision x 3 hours. PT Short Term Goal 2 - Progress (Week 3): Not met PT Short Term Goal 3 (Week 3): Pt will ambulate 5 ft with +1 assistance PT Short Term Goal 3 - Progress (Week 3): Met PT Short Term Goal 4 (Week 3): W/C mobility to be initiated in standard w/c. PT Short Term Goal 4 - Progress (Week 3): Not met Week 4:  PT Short Term Goal 1 (Week 4): =LTGs due to ELOS  Skilled Therapeutic Interventions/Progress Updates:    no c/o pain at rest, but spasticity/spasms in L hamstring appear worse today.  Session focus on standing tolerance and motor planning for bed mobility and transfers.    Pt requires increased time and max multimodal cues for supine<>sit.  Sit<>stand x3 in stedy from EOB with initial mod assist fade to supervision with verbal cues for hand placement.  Standing tolerance 3 trials (max time 2.5 minutes) focus on L weight shift and equal weight bearing through LEs.  Pt unable to achieve L knee extension in stance.  Returned to bed and pt positioned to comfort with call bell in reach and needs met.   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  Therapy/Group: Individual Therapy  Earnest Conroy Penven-Crew 03/04/2017, 3:25 PM

## 2017-03-05 ENCOUNTER — Inpatient Hospital Stay (HOSPITAL_COMMUNITY): Payer: 59 | Admitting: Occupational Therapy

## 2017-03-05 LAB — GLUCOSE, CAPILLARY
Glucose-Capillary: 78 mg/dL (ref 65–99)
Glucose-Capillary: 93 mg/dL (ref 65–99)
Glucose-Capillary: 113 mg/dL — ABNORMAL HIGH (ref 65–99)
Glucose-Capillary: 146 mg/dL — ABNORMAL HIGH (ref 65–99)

## 2017-03-05 NOTE — Plan of Care (Signed)
Problem: RH BOWEL ELIMINATION Goal: RH STG MANAGE BOWEL WITH ASSISTANCE STG Manage Bowel with Moderate Assistance to [facility] or bathroom.    Outcome: Not Progressing Incontinent

## 2017-03-05 NOTE — Progress Notes (Signed)
Occupational Therapy Session Note  Patient Details  Name: Frank Moss MRN: [id number] Date of Birth: 1970-10-24  Today's Date: 03/05/2017 OT Individual Time: 1300-1400 OT Individual Time Calculation (min): 60 min   Short Term Goals: Week 4:  OT Short Term Goal 1 (Week 4): LTG=STG 2/2 ELOS  Skilled Therapeutic Interventions/Progress Updates:    1:1 OT treatment session focused on functional use of L UE, L side attention, postural control, and standing balance. OT propelled wc to outside with pt participating in visual scanning and object finding activity along the way. Pt also cued to push elevator buttons with L UE and was able to push buttons and release buttons appropriately.  Pt completed graded peg board activity placed on L side with focus on L fine motor control, functional use of L UE, and facilitating L trunk shortening and R elongation w/ NDT techniques. OT also incorporated PNF patterns and facilitated L UE crossing midline. Pt noted to bed incontinent of urine so pt returned to room and worked on standing balance w/ facilitation for anterior weight shift for brief change and peri-care. Pt able to wash peri-area with OT providing Mod A to maintain anterior weight shift. Pt with improved LLE pain/spasticity today and tolerated weight bearing through  LLE. Pt returned to wc and left tilted with safety belt on and needs met.   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   Therapy/Group: Individual Therapy  Valma Cava 03/05/2017, 1:57 PM

## 2017-03-05 NOTE — Progress Notes (Signed)
[place] PHYSICAL MEDICINE & REHABILITATION     PROGRESS NOTE  Subjective/Complaints:  Pt with some mush/soft stools over night per RN. Pt denies any problems  ROS: pt denies nausea, vomiting,   cough, shortness of breath or chest pain   Objective: Vital Signs: Blood pressure (!) 160/83, pulse 93, temperature 99 F (37.2 C), temperature source Oral, resp. rate 18, height 6\' 2"  (1.88 m), weight 103 kg (227 lb 0.7 oz), SpO2 99 %. No results found. No results for input(s): WBC, HGB, HCT, PLT in the last 72 hours. No results for input(s): NA, K, CL, GLUCOSE, BUN, CREATININE, CALCIUM in the last 72 hours.  Invalid input(s): CO CBG (last 3)   Recent Labs  03/04/17 1636 03/04/17 2058 03/05/17 0628  GLUCAP 116* 148* 78    Wt Readings from Last 3 Encounters:  03/02/17 103 kg (227 lb 0.7 oz)  02/08/17 103.7 kg (228 lb 11.2 oz)  02/08/17 124.3 kg (274 lb)    Physical Exam:  BP (!) 160/83 (BP Location: Right Arm)   Pulse 93   Temp 99 F (37.2 C) (Oral)   Resp 18   Ht 6\' 2"  (1.88 m)   Wt 103 kg (227 lb 0.7 oz)   SpO2 99%   BMI 29.15 kg/m  Constitutional: He appears well-developedand well-nourished.  HENT: Normocephalicand atraumatic.  Eyes: EOMI. No discharge. Cardiovascular: RRR without murmur. No JVD . Respiratory: Effort normal breath sounds normal.  GI: Soft. Bowel sounds are normal.   Neurological: He is alertand oriented.  Left facial weakness with left gaze preference and right neglect, stable.  Delayed processing with slow speech.  Lacks awareness/insight into deficits. Spastic left hemiparesis  Motor: Left lower extremity: 2/5 proximal to distal Moving LUE spontaneously to do tasks, but not to command.  Skin: Skin is warmand dry.  Psychiatric: His affect is blunt. His speech is delayed. He is slowed. He expresses inappropriate judgment. He is inattentive.    Assessment/Plan: 1. 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 Physiatrist is providing close team supervision and 24 hour management of active medical problems listed below. Physiatrist and rehab team continue to assess barriers to discharge/monitor patient progress toward functional and medical goals.  Function:  Bathing Bathing position   Position: Wheelchair/chair at sink  Bathing parts Body parts bathed by patient: Right arm, Left arm, Abdomen, Chest, Front perineal area, Right upper leg, Left upper leg, Buttocks, Right lower leg Body parts bathed by helper: Left lower leg, Back  Bathing assist Assist Level: Touching or steadying assistance(Pt > 75%)      Upper Body Dressing/Undressing Upper body dressing   What is the patient wearing?: Pull over shirt/dress     Pull over shirt/dress - Perfomed by patient: Thread/unthread right sleeve, Put head through opening, Pull shirt over trunk Pull over shirt/dress - Perfomed by helper: Thread/unthread left sleeve        Upper body assist Assist Level: Touching or steadying assistance(Pt > 75%)      Lower Body Dressing/Undressing Lower body dressing   What is the patient wearing?: Non-skid slipper socks, Pants   Underwear - Performed by helper: Thread/unthread right underwear leg, Thread/unthread left underwear leg, Pull underwear up/down Pants- Performed by patient: Thread/unthread right pants leg Pants- Performed by helper: Pull pants up/down, Thread/unthread left pants leg Non-skid slipper socks- Performed by patient: Don/doff right sock Non-skid slipper socks- Performed  by helper: Don/doff left sock       Shoes - Performed by helper: Don/doff right shoe, Don/doff left shoe, Fasten right, Fasten left          Lower body assist Assist for lower body dressing: Touching or steadying assistance (Pt > 75%)      Toileting Toileting     Toileting steps completed by helper:  Performs perineal hygiene, Adjust clothing prior to toileting, Adjust clothing after toileting Toileting Assistive Devices: Grab bar or rail  Toileting assist Assist level: Two helpers   Transfers Chair/bed transfer   Chair/bed transfer method: Stand pivot Chair/bed transfer assist level: Maximal assist (Pt 25 - 49%/lift and lower) Chair/bed transfer assistive device: Armrests Mechanical lift: Stedy   Locomotion Ambulation Ambulation activity did not occur: Safety/medical concerns   Max distance: 30 Assist level: Touching or steadying assistance (Pt > 75%)   Wheelchair Wheelchair activity did not occur: Safety/medical concerns Type: Motorized Max wheelchair distance: 50 Assist Level: Moderate assistance (Pt 50 - 74%)  Cognition Comprehension Comprehension assist level: Understands basic 90% of the time/cues < 10% of the time, Understands basic 75 - 89% of the time/ requires cueing 10 - 24% of the time  Expression Expression assist level: Expresses basic 75 - 89% of the time/requires cueing 10 - 24% of the time. Needs helper to occlude trach/needs to repeat words.  Social Interaction Social Interaction assist level: Interacts appropriately 75 - 89% of the time - Needs redirection for appropriate language or to initiate interaction.  Problem Solving Problem solving assist level: Solves basic 25 - 49% of the time - needs direction more than half the time to initiate, plan or complete simple activities  Memory Memory assist level: Recognizes or recalls 25 - 49% of the time/requires cueing 50 - 75% of the time    Medical Problem List and Plan: 1.  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  DVT Prophylaxis/Anticoagulation: Pharmaceutical: Lovenox 3. H/o lumbago/Pain Management:  Denies pain at this moment. Tylenol prn Knee pain Left  4. Mood: LCSW to follow for evaluation and support.  5. Neuropsych: This patient is not capable of making decisions on his  own behalf. Trial ritalin increase dose Cont lexapro 6. Skin/Wound Care: routine pressure relief measures 7. Fluids/Electrolytes/Nutrition: Monitor I/Os--offer supplements if intake poor.   8.T2DM: monitor BS ac/hs. Continue lantus daily with meal coverage. Will use SSI for elevated BS.    CBG (last 3)   Recent Labs  03/04/17 1636 03/04/17 2058 03/05/17 0628  GLUCAP 116* 148* 78   Cont  lantus  35U,   Some lability but overall improved and within desired range of 80-180 9. HTN:  Monitor BP bid  Norvasc  increased to 10mg  7/30  Changed Hytrin back to flomax again will need to increase other meds  Hydralazine 10 started 8/13, increased to 25 QID Vitals:   03/04/17 2241 03/05/17 0444  BP: (!) 151/78 (!) 160/83  Pulse: 97 93  Resp:  18  Temp:  99 F (37.2 C)  SpO2:  99%   10 H/o urinary retention: currently incontinent. Toilet patient every 4 hours and cath if retention noted.   11. Hyperlipidemia: On atorvastatin.  12. Question CKD: baseline SCr around 1.6 per records review. Continue to monitor.  Offer fluids between meals for adequate hydration.   Cr.  1.18 on 8/15     Cont to monitor 13. Hyponatremia: Resolved   Sodium 139 on 8/15  Continue to monitor 14. Hypoalbuminemia  Supplement initiated 7/28 15. Acute blood loss anemia  Hemoglobin 10.6 on 8/15    Continue to monitor 16.  Spasticity- Left side , L hamstring most affected has daytime spasticity , Increased zanaflex to 2mg  BID and 4mg  Qhs,Botox 100 U to L hamstring on 8/5 17.  Urinary incont- occ retention now on Hytrin increase to 4mg  BID,   Previously failed flomax and place on hytrin to have a greater BP lowering effect, since urology recommending flomax will restart and adjust BP meds   Urecholine 25mg QID, increased to 50 on  8/14---stopped given rec for foley will d/c  18.  Bowel incont cognitive factors , daily soft stools   -stools have become a little loose--may be due to ensure---will hold it and osberve.   LOS (Days) 22 A FACE TO FACE EVALUATION WAS PERFORMED  Deshonda Cryderman T 03/05/2017 8:24 AM

## 2017-03-06 ENCOUNTER — Inpatient Hospital Stay (HOSPITAL_COMMUNITY): Payer: 59 | Admitting: Occupational Therapy

## 2017-03-06 LAB — GLUCOSE, CAPILLARY
GLUCOSE-CAPILLARY: 90 mg/dL (ref 65–99)
GLUCOSE-CAPILLARY: 133 mg/dL — AB (ref 65–99)
Glucose-Capillary: 73 mg/dL (ref 65–99)
Glucose-Capillary: 99 mg/dL (ref 65–99)

## 2017-03-06 LAB — URINALYSIS, COMPLETE (UACMP) WITH MICROSCOPIC
Bilirubin Urine: NEGATIVE
Glucose, UA: NEGATIVE mg/dL
HGB URINE DIPSTICK: NEGATIVE
Ketones, ur: NEGATIVE mg/dL
Leukocytes, UA: NEGATIVE
NITRITE: NEGATIVE
Protein, ur: >=300 mg/dL — AB
SPECIFIC GRAVITY, URINE: 1.022 (ref 1.005–1.030)
Squamous Epithelial / LPF: NONE SEEN
pH: 5 (ref 5.0–8.0)

## 2017-03-06 MED ORDER — TAMSULOSIN HCL 0.4 MG PO CAPS
0.4000 mg | ORAL_CAPSULE | Freq: Every day | ORAL | Status: DC
  Administered 2017-03-06 – 2017-03-10 (×5): 0.4 mg via ORAL
  Filled 2017-03-06 (×5): qty 1

## 2017-03-06 NOTE — Progress Notes (Signed)
Patient did not have any incontinent episode of urine during night shift. Offered urinal to the patient but did not have the urge to void. Bladder scanned this am for 490 cc and coude cathed for 375 cc.

## 2017-03-06 NOTE — Progress Notes (Signed)
Patient unable to void this afternoon after attempting to use toilet and urinal standing up. This was third need for cath so Coude Foley was placed per order. MD Riley Kill notified of patient's persistent urinary retention. Foley placed at 1430 with amber urine return. UA/culture collected. Patient tolerated well. Call bell left within reach.

## 2017-03-06 NOTE — Progress Notes (Signed)
Occupational Therapy Session Note  Patient Details  Name: Frank Moss MRN: [id number] Date of Birth: [birth year]  Today's Date: 03/06/2017 OT Individual Time: 3729-0211 OT Individual Time Calculation (min): 30 min   Skilled Therapeutic Interventions/Progress Updates:    Tx focus on functional transfers, Lt NMR, balance, and midline orientation during self care completion.   Pt greeted supine in bed, agreeable to tx. He completed supine<sit with min guard and cues to release Lt hand grasp on bedrail. Stand pivot<w/c with Max A with max multimodal cues for initiation and sequencing. Once in w/c, pt engaged in UB/LB dressing. He still c/o L LE pain, grimaced and recoiled when OT attempted to place Lt leg in figure 4 for threading pants. RN made aware of this. HOH cuing for integrating L UE functionally, sometimes just placing Lt hand on pants or deodorant cap was enough to promote appropriate usage. OT standing at pts side, outside of mirror view to reduce visual distractions and promote attention to ADL tasks. He still requires instructional cues for problem solving/orienting clothing correctly. Mod A sit<stand for clothing mgt with multiple posterior LOBs (per pt, due to L LE hurting). Max A to correct LOBs, cues to attend to mirror to decrease Lt pushing tendencies also. At end of tx pt was reclined in TIS and safety belt donned (he also clasped w/c belt buckle). Pt left with all needs within reach at time of departure, eagerly turned on TV to watch professional little league game.   He zipped and buttoned his pants today with supervision!  Therapy Documentation Precautions:  Precautions Precautions: Fall Precaution Comments: pusher to the left, apraxia, decreased initiation, left hemiparesis Restrictions Weight Bearing Restrictions: No  Pain: L LE, RN made aware   ADL: :    See Function Navigator for Current Functional Status.   Therapy/Group: Individual Therapy  Breniya Goertzen A  Javis Abboud 03/06/2017, 12:26 PM

## 2017-03-06 NOTE — Progress Notes (Addendum)
[place] PHYSICAL MEDICINE & REHABILITATION     PROGRESS NOTE  Subjective/Complaints:  Pt   required I/O cath x 2 overnight into the morning---volumes 300,475, +odor Objective: Vital Signs: Blood pressure (!) 172/76, pulse 96, temperature 98.7 F (37.1 C), temperature source Oral, resp. rate 19, height 6\' 2"  (1.88 m), weight 103 kg (227 lb 0.7 oz), SpO2 100 %. No results found. No results for input(s): WBC, HGB, HCT, PLT in the last 72 hours. No results for input(s): NA, K, CL, GLUCOSE, BUN, CREATININE, CALCIUM in the last 72 hours.  Invalid input(s): CO CBG (last 3)   Recent Labs  03/05/17 1632 03/05/17 2131 03/06/17 0615  GLUCAP 113* 146* 73    Wt Readings from Last 3 Encounters:  03/02/17 103 kg (227 lb 0.7 oz)  02/08/17 103.7 kg (228 lb 11.2 oz)  02/08/17 124.3 kg (274 lb)    Physical Exam:  BP (!) 172/76 (BP Location: Right Arm)   Pulse 96   Temp 98.7 F (37.1 C) (Oral)   Resp 19   Ht 6\' 2"  (1.88 m)   Wt 103 kg (227 lb 0.7 oz)   SpO2 100%   BMI 29.15 kg/m  Constitutional: He appears well-developedand well-nourished.  HENT: Normocephalicand atraumatic.  Eyes: EOMI. No discharge. Cardiovascular: RRR without murmur. No JVD . Respiratory: CTA Bilaterally without wheezes or rales. Normal effort .  GI: Soft. Bowel sounds are normal.   Neurological: He is alertand oriented.  Left facial weakness with left gaze preference and right neglect, stable.  Delayed processing with slow speech.  Lacks awareness/insight into deficits. Spastic left hemiparesis  Motor: Left lower extremity: 2/5 proximal to distal Moving LUE spontaneously only.  Skin: Skin is warmand dry.  Psychiatric: His affect is blunt. His speech is delayed. He is slowed. He expresses inappropriate judgment. He is inattentive.    Assessment/Plan: 1. 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 Physiatrist is providing close team supervision and 24 hour management of active medical problems listed below. Physiatrist and rehab team continue to assess barriers to discharge/monitor patient progress toward functional and medical goals.  Function:  Bathing Bathing position   Position: Wheelchair/chair at sink  Bathing parts Body parts bathed by patient: Right arm, Left arm, Abdomen, Chest, Front perineal area, Right upper leg, Left upper leg, Buttocks, Right lower leg Body parts bathed by helper: Left lower leg, Back  Bathing assist Assist Level: Touching or steadying assistance(Pt > 75%)      Upper Body Dressing/Undressing Upper body dressing   What is the patient wearing?: Pull over shirt/dress     Pull over shirt/dress - Perfomed by patient: Thread/unthread right sleeve, Put head through opening, Pull shirt over trunk Pull over shirt/dress - Perfomed by helper: Thread/unthread left sleeve        Upper body assist Assist Level: Touching or steadying assistance(Pt > 75%)      Lower Body Dressing/Undressing Lower body dressing   What is the patient wearing?: Non-skid slipper socks, Pants   Underwear - Performed by helper: Thread/unthread right underwear leg, Thread/unthread left underwear leg, Pull underwear up/down Pants- Performed by patient: Thread/unthread right pants leg Pants- Performed by helper: Pull pants up/down, Thread/unthread left pants leg Non-skid slipper socks- Performed by patient: Don/doff right sock Non-skid slipper socks- Performed by helper: Don/doff left sock       Shoes - Performed by helper: Don/doff right  shoe, Don/doff left shoe, Fasten right, Fasten left          Lower body assist Assist for lower body dressing: Touching or steadying assistance (Pt > 75%)      Toileting Toileting     Toileting steps completed by helper: Performs perineal hygiene, Adjust clothing prior to toileting, Adjust clothing  after toileting Toileting Assistive Devices: Grab bar or rail  Toileting assist Assist level: Two helpers   Transfers Chair/bed transfer   Chair/bed transfer method: Stand pivot Chair/bed transfer assist level: Maximal assist (Pt 25 - 49%/lift and lower) Chair/bed transfer assistive device: Armrests Mechanical lift: Stedy   Locomotion Ambulation Ambulation activity did not occur: Safety/medical concerns   Max distance: 30 Assist level: Touching or steadying assistance (Pt > 75%)   Wheelchair Wheelchair activity did not occur: Safety/medical concerns Type: Motorized Max wheelchair distance: 50 Assist Level: Moderate assistance (Pt 50 - 74%)  Cognition Comprehension Comprehension assist level: Understands basic 90% of the time/cues < 10% of the time  Expression Expression assist level: Expresses basic 75 - 89% of the time/requires cueing 10 - 24% of the time. Needs helper to occlude trach/needs to repeat words.  Social Interaction Social Interaction assist level: Interacts appropriately 50 - 74% of the time - May be physically or verbally inappropriate.  Problem Solving Problem solving assist level: Solves basic 25 - 49% of the time - needs direction more than half the time to initiate, plan or complete simple activities  Memory Memory assist level: Recognizes or recalls 25 - 49% of the time/requires cueing 50 - 75% of the time    Medical Problem List and Plan: 1.  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  DVT Prophylaxis/Anticoagulation: Pharmaceutical: Lovenox 3. H/o lumbago/Pain Management: Denies pain at this moment. Tylenol prn  -Knee pain Left  4. Mood: LCSW to follow for evaluation and support.  5. Neuropsych: This patient is not capable of making decisions on his   own behalf. Trial ritalin increased dose Cont lexapro 6. Skin/Wound Care: routine pressure relief measures 7. Fluids/Electrolytes/Nutrition: Monitor I/Os--offer supplements if intake poor.   8.T2DM: monitor BS ac/hs. Continue lantus daily with meal coverage. Will use SSI for elevated BS.    CBG (last 3)   Recent Labs  03/05/17 1632 03/05/17 2131 03/06/17 0615  GLUCAP 113* 146* 73   Cont  lantus  35U,   Some lability early in weekend, better yesterday 9. HTN:  Monitor BP bid  Norvasc  increased to 10mg  7/30  Changed Hytrin back to flomax again will need to increase other meds  Hydralazine 10 started 8/13, increased to 25 QID Vitals:   03/05/17 2336 03/06/17 0417  BP: (!) 162/81 (!) 172/76  Pulse:  96  Resp:  19  Temp:  98.7 F (37.1 C)  SpO2:  100%   10 H/o urinary retention: currently incontinent. Toilet patient every 4 hours and cath if retention noted.   11. Hyperlipidemia: On atorvastatin.  12. Question CKD: baseline SCr around 1.6 per records review. Continue to monitor.  Offer fluids between meals for adequate hydration.   Cr. 1.18 on 8/15     Cont to monitor 13. Hyponatremia: Resolved   Sodium 139 on 8/15  Continue to monitor 14. Hypoalbuminemia  Supplement initiated 7/28 15. Acute blood loss anemia  Hemoglobin 10.6 on 8/15  Continue to monitor 16.  Spasticity- Left side , L hamstring most affected has daytime spasticity , Increased zanaflex to 2mg  BID and 4mg  Qhs,Botox 100 U to L hamstring on 8/5 17.  Urinary incont- occ retention now on Hytrin increase to 4mg  BID,   Previously failed flomax and place on hytrin to have a greater BP lowering effect  -off all bladder meds currently  -will place foley if requires another I/O cath today (2 already)   -urine with odor---check ua/ucs 18.  Bowel incont cognitive factors , daily soft stools   -ensure held due to concern it was making stools loose  LOS (Days) 23 A FACE TO FACE EVALUATION WAS  PERFORMED  SWARTZ,ZACHARY T 03/06/2017 8:43 AM

## 2017-03-07 ENCOUNTER — Inpatient Hospital Stay (HOSPITAL_COMMUNITY): Payer: 59

## 2017-03-07 ENCOUNTER — Inpatient Hospital Stay (HOSPITAL_COMMUNITY): Payer: 59 | Admitting: Occupational Therapy

## 2017-03-07 ENCOUNTER — Inpatient Hospital Stay (HOSPITAL_COMMUNITY): Payer: 59 | Admitting: Physical Therapy

## 2017-03-07 LAB — GLUCOSE, CAPILLARY
GLUCOSE-CAPILLARY: 81 mg/dL (ref 65–99)
GLUCOSE-CAPILLARY: 106 mg/dL — AB (ref 65–99)
Glucose-Capillary: 125 mg/dL — ABNORMAL HIGH (ref 65–99)
Glucose-Capillary: 130 mg/dL — ABNORMAL HIGH (ref 65–99)

## 2017-03-07 MED ORDER — AMPICILLIN 250 MG PO CAPS
250.0000 mg | ORAL_CAPSULE | Freq: Four times a day (QID) | ORAL | Status: DC
  Filled 2017-03-07: qty 1

## 2017-03-07 MED ORDER — HYDRALAZINE HCL 50 MG PO TABS
50.0000 mg | ORAL_TABLET | Freq: Three times a day (TID) | ORAL | Status: DC
  Administered 2017-03-07 – 2017-03-11 (×14): 50 mg via ORAL
  Filled 2017-03-07 (×14): qty 1

## 2017-03-07 MED ORDER — AMOXICILLIN 250 MG PO CAPS
250.0000 mg | ORAL_CAPSULE | Freq: Three times a day (TID) | ORAL | Status: DC
  Administered 2017-03-07 – 2017-03-11 (×13): 250 mg via ORAL
  Filled 2017-03-07 (×16): qty 1

## 2017-03-07 NOTE — Plan of Care (Signed)
Problem: RH BLADDER ELIMINATION Goal: RH STG MANAGE BLADDER WITH EQUIPMENT WITH ASSISTANCE STG Manage Bladder With appropriate Equipment With Supervision or minimal Assistance   Outcome: Not Progressing Indwelling catheter inserted on 03/06/2017  Problem: RH KNOWLEDGE DEFICIT Goal: RH STG INCREASE KNOWLEDGE OF DIABETES Patient and family will demonstrate increased knowledge of diabetes and associated medications, s/s of hypo/hyperglycemia, dietary restrictions, and follow-up with the MD post discharge with resources/reminders.   Outcome: Not Progressing Family not available to assess education. Pt. Demonstrates no evidence of learning Goal: RH STG INCREASE KNOWLEDGE OF HYPERTENSION Patient and family with demonstrate knowledge of HTN medications and dietary restrictions at discharge with reminders and resources.   Outcome: Not Progressing Family unavailable to assess education. Pt. Demonstrates no evidence of learning

## 2017-03-07 NOTE — Progress Notes (Signed)
Social Work Patient ID: Frank Moss, male   DOB: [birth year], 46 y.o.   MRN: [id number]  Received email from wife asking to submit her ramp quotes os [employer] can deny and she can appeal this. Have informed her ramps and stair lifts are not covered by health insurance. Have contacted [employer] benefits through her Rosann Auerbach and  They do not cover this. Have contacted Jennifer-[employer] CM to ask how wife would submit this to her insurance. Awaiting return call. Cailtyn-PT will be emailing wife regarding her questions why pt can not get a power chair and how is he suppose to get around at home without one. Have informed wife pt is not safe with a power chair nor does he have the cognitive ability to manage a power chair. [name]-PT to follow up with her on this. Have asked again about the caregiver's and the need to rain them prior to pt's discharge. Have yet to hear from wife on this. She was here this weekend according to Fabricio Endsley-RN but did not stay long enough to go to therapies with pt. Will continue to work on this discharge for this Friday.

## 2017-03-07 NOTE — Progress Notes (Signed)
[place] PHYSICAL MEDICINE & REHABILITATION     PROGRESS NOTE  Subjective/Complaints:  No issues overnite , foley placed due to retention progressive difficulty with cathing Objective: Vital Signs: Blood pressure (!) 175/91, pulse 89, temperature 98.4 F (36.9 C), temperature source Oral, resp. rate 18, height 6\' 2"  (1.88 m), weight 103 kg (227 lb 0.7 oz), SpO2 99 %. No results found. No results for input(s): WBC, HGB, HCT, PLT in the last 72 hours. No results for input(s): NA, K, CL, GLUCOSE, BUN, CREATININE, CALCIUM in the last 72 hours.  Invalid input(s): CO CBG (last 3)   Recent Labs  03/06/17 1630 03/06/17 2103 03/07/17 0557  GLUCAP 99 133* 81    Wt Readings from Last 3 Encounters:  03/02/17 103 kg (227 lb 0.7 oz)  02/08/17 103.7 kg (228 lb 11.2 oz)  02/08/17 124.3 kg (274 lb)    Physical Exam:  BP (!) 175/91 (BP Location: Right Arm)   Pulse 89   Temp 98.4 F (36.9 C) (Oral)   Resp 18   Ht 6\' 2"  (1.88 m)   Wt 103 kg (227 lb 0.7 oz)   SpO2 99%   BMI 29.15 kg/m  Constitutional: He appears well-developedand well-nourished.  HENT: Normocephalicand atraumatic.  Eyes: EOMI. No discharge. Cardiovascular: RRR without murmur. No JVD . Respiratory: CTA Bilaterally without wheezes or rales. Normal effort .  GI: Soft. Bowel sounds are normal.   Neurological: He is alertand oriented.  Left facial weakness with left gaze preference and right neglect, stable.  Delayed processing with slow speech.  Lacks awareness/insight into deficits. Spastic left hemiparesis  Motor: Left lower extremity: 2/5 proximal to distal Moving LUE spontaneously only.  Skin: Skin is warmand dry.  Psychiatric: His affect is blunt. His speech is delayed. He is slowed. He expresses inappropriate judgment. He is inattentive.    Assessment/Plan: 1. 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 Physiatrist is providing close team supervision and 24 hour management of active medical problems listed below. Physiatrist and rehab team continue to assess barriers to discharge/monitor patient progress toward functional and medical goals.  Function:  Bathing Bathing position   Position: Wheelchair/chair at sink  Bathing parts Body parts bathed by patient: Right arm, Left arm, Abdomen, Chest, Front perineal area, Right upper leg, Left upper leg, Buttocks, Right lower leg Body parts bathed by helper: Left lower leg, Back  Bathing assist Assist Level: Touching or steadying assistance(Pt > 75%)      Upper Body Dressing/Undressing Upper body dressing   What is the patient wearing?: Pull over shirt/dress     Pull over shirt/dress - Perfomed by patient: Thread/unthread right sleeve, Put head through opening, Pull shirt over trunk, Thread/unthread left sleeve Pull over shirt/dress - Perfomed by helper: Thread/unthread left sleeve        Upper body assist Assist Level: Supervision or verbal cues      Lower Body Dressing/Undressing Lower body dressing   What is the patient wearing?: Non-skid slipper socks, Pants   Underwear - Performed by helper: Thread/unthread right underwear leg, Thread/unthread left underwear leg, Pull underwear up/down Pants- Performed by patient: Thread/unthread right pants leg Pants- Performed by helper: Pull pants up/down, Thread/unthread left pants leg Non-skid slipper socks- Performed by patient: Don/doff right sock Non-skid slipper socks- Performed by helper: Don/doff left sock       Shoes - Performed by helper: Don/doff right  shoe, Don/doff left shoe, Fasten right, Fasten left          Lower body assist Assist for lower body dressing: Touching or steadying assistance (Pt > 75%)      Toileting Toileting     Toileting steps completed by helper: Performs perineal hygiene, Adjust clothing after toileting,  Adjust clothing prior to toileting Toileting Assistive Devices: Grab bar or rail  Toileting assist Assist level: Two helpers   Transfers Chair/bed transfer   Chair/bed transfer method: Stand pivot Chair/bed transfer assist level: Maximal assist (Pt 25 - 49%/lift and lower) Chair/bed transfer assistive device: Armrests Mechanical lift: Stedy   Locomotion Ambulation Ambulation activity did not occur: Safety/medical concerns   Max distance: 30 Assist level: Touching or steadying assistance (Pt > 75%)   Wheelchair Wheelchair activity did not occur: Safety/medical concerns Type: Motorized Max wheelchair distance: 50 Assist Level: Moderate assistance (Pt 50 - 74%)  Cognition Comprehension Comprehension assist level: Understands basic 90% of the time/cues < 10% of the time  Expression Expression assist level: Expresses basic 75 - 89% of the time/requires cueing 10 - 24% of the time. Needs helper to occlude trach/needs to repeat words.  Social Interaction Social Interaction assist level: Interacts appropriately 75 - 89% of the time - Needs redirection for appropriate language or to initiate interaction.  Problem Solving Problem solving assist level: Solves basic 25 - 49% of the time - needs direction more than half the time to initiate, plan or complete simple activities  Memory Memory assist level: Recognizes or recalls 25 - 49% of the time/requires cueing 50 - 75% of the time    Medical Problem List and Plan: 1.  Left hemiparesis, hemisensory loss,  and cognitive deficits secondary to right ACA infarct, small right thalamic infarct on 7/24  Cont CIR,PT, OT, SLP severe apraxia, reduced initiation, severe left neglect and awareness of deficits limiting improvement.2.  DVT Prophylaxis/Anticoagulation: Pharmaceutical: Lovenox 3. H/o lumbago/Pain Management: Denies pain at this moment. Tylenol prn  -Knee pain Left  4. Mood: LCSW to follow for evaluation and support.  5. Neuropsych: This  patient is not capable of making decisions on his  own behalf. Trial ritalin increased dose Cont lexapro 6. Skin/Wound Care: routine pressure relief measures 7. Fluids/Electrolytes/Nutrition: Monitor I/Os--offer supplements if intake poor.   8.T2DM: monitor BS ac/hs. Continue lantus daily with meal coverage. Will use SSI for elevated BS.    CBG (last 3)   Recent Labs  03/06/17 1630 03/06/17 2103 03/07/17 0557  GLUCAP 99 133* 81   Cont  lantus  35U,   Some lability early in weekend, better yesterday 9. HTN:  Monitor BP bid  Norvasc  increased to 10mg  7/30  Changed Hytrin back to flomax again will need to increase other meds  Hydralazine 10 started 8/13, increased to 25 QID Vitals:   03/06/17 1452 03/07/17 0512  BP: (!) 156/80 (!) 175/91  Pulse: 89 89  Resp: 20 18  Temp: 98.8 F (37.1 C) 98.4 F (36.9 C)  SpO2: 99% 99%   10 H/o urinary retention: currently incontinent. Toilet patient every 4 hours and cath if retention noted.   11. Hyperlipidemia: On atorvastatin.  12. Question CKD: baseline SCr around 1.6 per records review. Continue to monitor.  Offer fluids between meals for adequate hydration.   Cr. 1.18 on 8/15     Cont to monitor 13. Hyponatremia: Resolved   Sodium 139 on 8/15  Continue to monitor 14. Hypoalbuminemia  Supplement initiated 7/28 15. Acute blood loss  anemia  Hemoglobin 10.6 on 8/15    Continue to monitor 16.  Spasticity- Left side , L hamstring most affected has daytime spasticity , Increased zanaflex to 2mg  BID and 4mg  Qhs,Botox 100 U to L hamstring on 8/5 17.  Urinary incont- occ retention now on Hytrin increase to 4mg  BID,   Previously failed flomax and place on hytrin to have a greater BP lowering effect   -cont foley  -urine with odor---Negative ua, ucs pnd 18.  Bowel incont cognitive factors , daily soft stools   -ensure held due to concern it was making stools loose  LOS (Days) 24 A FACE TO FACE EVALUATION WAS  PERFORMED  Frank Moss 03/07/2017 6:43 AM

## 2017-03-07 NOTE — Progress Notes (Signed)
Physical Therapy Session Note  Patient Details  Name: Frank Moss MRN: [id number] Date of Birth: 11/09/1970  Today's Date: 03/07/2017 PT Individual Time: 1300-1425 PT Individual Time Calculation (min): 85 min   Short Term Goals: Week 4:  PT Short Term Goal 1 (Week 4): =LTGs due to ELOS  Skilled Therapeutic Interventions/Progress Updates:    no c/o pain.  Session focus on NMR and transfer training.    Pt completes nustep x10 minutes (target 8 minutes) with 4 extremities at level 3 for forced use, reciprocal stepping pattern retraining, activity tolerance, and attention to task.  Pt requires mod cues to attend to timer and cease activity when time was up.    Stand/pivot throughout session to R and L with consistent mod assist for facilitation of pivot.  Pt consistently requires max multimodal cues for sequencing transfer.    Gait training x30' with rail in hallway and min assist with mod multimodal cues for step length on LLE.  Gait training with 3 muskateers for improved posture and safety to return to w/c.    Standing tolerance focus on L weight bearing and upright posture during cognitive task with mod assist for balance and min cues for sequencing A-1 task.    Pt returned to room at end of session and positioned in tilt in space with call bell in reach and needs met.   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   Therapy/Group: Individual Therapy   E Penven-Crew 03/07/2017, 4:22 PM  

## 2017-03-07 NOTE — Plan of Care (Signed)
Problem: RH BOWEL ELIMINATION Goal: RH STG MANAGE BOWEL WITH ASSISTANCE STG Manage Bowel with Moderate Assistance to [facility] or bathroom.    Outcome: Not Progressing Pt continues to be incontinent of bowel and unable to verbalize the need to go.

## 2017-03-07 NOTE — Progress Notes (Signed)
Speech Language Pathology Daily Session Note  Patient Details  Name: Frank Moss MRN: [id number] Date of Birth: 1970/10/05  Today's Date: 03/07/2017 SLP Individual Time: 0630-1601 SLP Individual Time Calculation (min): 45 min  Short Term Goals: Week 4: SLP Short Term Goal 1 (Week 4): STGs=LTGs  Skilled Therapeutic Interventions: Skilled ST treatment focused on cognitive goals. SLP facilitated recall of medications utilizing visual aid list and categorization cues to increase recall and comprehension skills. Pt demonstrated recall and ability to response to category question about medication utilizing visual aid with Mod verbal and questions cues. SLP facilitated basic problem solving task and recall of instructions with novel card game, requiring mod-max verbal and question cues and mod-max verbal cues to redirect attention during task. SLP reviewed with pt upcoming discharge date. Pt demonstrated recall of day and date at the end of session. Pt left in room in chair with call bell within reach. Recommend to continue ST services.     Function:  Eating Eating   Modified Consistency Diet: No Eating Assist Level: Set up assist for   Eating Set Up Assist For: Opening containers       Cognition Comprehension Comprehension assist level: Understands basic 90% of the time/cues < 10% of the time  Expression   Expression assist level: Expresses basic 75 - 89% of the time/requires cueing 10 - 24% of the time. Needs helper to occlude trach/needs to repeat words.  Social Interaction Social Interaction assist level: Interacts appropriately 75 - 89% of the time - Needs redirection for appropriate language or to initiate interaction.  Problem Solving Problem solving assist level: Solves basic 25 - 49% of the time - needs direction more than half the time to initiate, plan or complete simple activities  Memory Memory assist level: Recognizes or recalls 25 - 49% of the time/requires cueing 50 - 75% of  the time    Pain Pain Assessment Pain Assessment: No/denies pain  Therapy/Group: Individual Therapy  Rashanna Christiana  [facility] 03/07/2017, 12:48 PM

## 2017-03-07 NOTE — Progress Notes (Signed)
Social Work Patient ID: Frank Moss, male   DOB: [birth year], 46 y.o.   MRN: [id number]  Met with wife who came to discuss questions and concerns. Informed MD would need to say it is medically necessary for pt to Have a stair lift or a power chair. Have asked MD to address this with wife and also [name]-PT, both have wife's email address. Informed wife she will need to be the one to hire private duty caregivers for pt since this is not Covered by [place] nor any other insurance. She feels if all of this is submitted to insurance and they deny she can appeal the decision and get coverage for him. Have directed her to [place] Case Manager to ask the Questions regarding coverages for the services wife wants for Ray. Asked again who would be coming in for training and her response is that their daughter has recently gotten a full time job so she will need a caregiver from 9-3 pm-M-F And they would fill in the gaps. Wife seems to be focused on getting pt upstairs and the equipment she feels he needs, not the most important of who will be providing the care he needs. Will continue to work on the discharge needs.

## 2017-03-07 NOTE — Progress Notes (Signed)
Occupational Therapy Session Note  Patient Details  Name: Frank Moss MRN: [id number] Date of Birth: Jun 24, 1971  Today's Date: 03/07/2017 OT Individual Time: 0901-1000 OT Individual Time Calculation (min): 59 min    Short Term Goals: Week 4:  OT Short Term Goal 1 (Week 4): LTG=STG 2/2 ELOS  Skilled Therapeutic Interventions/Progress Updates:    OT treatment session focused on functional use of L UE, standing balance, wc positioning, and transfers. Pt with new foley placed yesterday, continues to pull on catheter placed on L side 2/2 alien arm. OT moved catheter anchor to R leg. Pt transferred to sitting EOB with min A. Addressed sitting balance and pelvic position while threading pants. Pt needed assistance to thread L pant leg, then provided postural support and instructional cues for pt to be able to thread R LE. Pt stood with mod A and posterior lean, able to pull pants up with B UEs while OT provided mod A for balance. Stand-pivot to wc with 2 trials to facilitate pivot to R. Addressed crossing midline with L UE during bathing tasks with hand-over hand A and increased time to initiate. Pt needed assistance to orient shirt and instructional cues to dress L side. Stedy used for sit<>stand while OT placed wedge under R hip to promote pelvic positioning in wc.  Pt left seated in TIS wc at end of session with safety belt on and needs met.  Therapy Documentation Precautions:  Precautions Precautions: Fall Precaution Comments: pusher to the left, apraxia, decreased initiation, left hemiparesis Restrictions Weight Bearing Restrictions: No Pain:   none/denies pain See Function Navigator for Current Functional Status.   Therapy/Group: Individual Therapy  Valma Cava 03/07/2017, 9:45 AM

## 2017-03-08 ENCOUNTER — Inpatient Hospital Stay (HOSPITAL_COMMUNITY): Payer: 59

## 2017-03-08 ENCOUNTER — Inpatient Hospital Stay (HOSPITAL_COMMUNITY): Payer: 59 | Admitting: Occupational Therapy

## 2017-03-08 ENCOUNTER — Inpatient Hospital Stay (HOSPITAL_COMMUNITY): Payer: 59 | Admitting: Physical Therapy

## 2017-03-08 LAB — GLUCOSE, CAPILLARY
GLUCOSE-CAPILLARY: 77 mg/dL (ref 65–99)
GLUCOSE-CAPILLARY: 120 mg/dL — AB (ref 65–99)
Glucose-Capillary: 139 mg/dL — ABNORMAL HIGH (ref 65–99)
Glucose-Capillary: 195 mg/dL — ABNORMAL HIGH (ref 65–99)

## 2017-03-08 LAB — URINE CULTURE

## 2017-03-08 MED ORDER — INSULIN GLARGINE 100 UNIT/ML ~~LOC~~ SOLN
32.0000 [IU] | Freq: Every day | SUBCUTANEOUS | Status: DC
  Administered 2017-03-08: 32 [IU] via SUBCUTANEOUS
  Filled 2017-03-08: qty 0.32

## 2017-03-08 NOTE — Progress Notes (Signed)
Social Work Patient ID: Frank Moss, male   DOB: [birth year], 46 y.o.   MRN: [id number] Have emailed wife to ask when she is available to come in for family training prior to his discharge on Friday. I have also asked if Any preference for a PCP since pt will need one prior to discharge. Aware ramp is being built today and will await response from wife.

## 2017-03-08 NOTE — Progress Notes (Signed)
PHYSICAL MEDICINE & REHABILITATION     PROGRESS NOTE  Subjective/Complaints:  No issues overnite, pt somnolent, cannot identify me  ROS- cannot obtain due to cognitive status Objective: Vital Signs: Blood pressure (!) 151/93, pulse 96, temperature 98.1 F (36.7 C), temperature source Oral, resp. rate 18, height 6\' 2"  (1.88 m), weight 103 kg (227 lb 0.7 oz), SpO2 98 %. No results found. No results for input(s): WBC, HGB, HCT, PLT in the last 72 hours. No results for input(s): NA, K, CL, GLUCOSE, BUN, CREATININE, CALCIUM in the last 72 hours.  Invalid input(s): CO CBG (last 3)   Recent Labs  03/07/17 1650 03/07/17 2101 03/08/17 0613  GLUCAP 125* 130* 77    Wt Readings from Last 3 Encounters:  03/02/17 103 kg (227 lb 0.7 oz)  02/08/17 103.7 kg (228 lb 11.2 oz)  02/08/17 124.3 kg (274 lb)    Physical Exam:  BP (!) 151/93 (BP Location: Right Arm)   Pulse 96   Temp 98.1 F (36.7 C) (Oral)   Resp 18   Ht 6\' 2"  (1.88 m)   Wt 103 kg (227 lb 0.7 oz)   SpO2 98%   BMI 29.15 kg/m  Constitutional: He appears well-developedand well-nourished.  HENT: Normocephalicand atraumatic.  Eyes: EOMI. No discharge. Cardiovascular: RRR without murmur. No JVD . Respiratory: CTA Bilaterally without wheezes or rales. Normal effort .  GI: Soft. Bowel sounds are normal.   Neurological: He is alertand oriented.  Left facial weakness with left gaze preference and right neglect, stable.  Delayed processing with slow speech.  Lacks awareness/insight into deficits. Spastic left hemiparesis  Motor: Left lower extremity: 2/5 proximal to distal Moving LUE spontaneously only.  Skin: Skin is warmand dry.  Psychiatric: His affect is blunt. His speech is delayed. He is slowed. He expresses inappropriate judgment. He is inattentive.  Opens eyes briefly to command  Assessment/Plan: 1. 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, small right thalamic infarct which require 3+ hours  per day of interdisciplinary therapy in a comprehensive inpatient rehab setting. Physiatrist is providing close team supervision and 24 hour management of active medical problems listed below. Physiatrist and rehab team continue to assess barriers to discharge/monitor patient progress toward functional and medical goals.  Function:  Bathing Bathing position   Position: Wheelchair/chair at sink  Bathing parts Body parts bathed by patient: Right arm, Left arm, Abdomen, Chest, Front perineal area, Right upper leg, Left upper leg, Buttocks, Right lower leg Body parts bathed by helper: Left lower leg, Back  Bathing assist Assist Level: Touching or steadying assistance(Pt > 75%)      Upper Body Dressing/Undressing Upper body dressing   What is the patient wearing?: Pull over shirt/dress     Pull over shirt/dress - Perfomed by patient: Thread/unthread right sleeve, Put head through opening, Pull shirt over trunk, Thread/unthread left sleeve Pull over shirt/dress - Perfomed by helper: Thread/unthread left sleeve        Upper body assist Assist Level: Supervision or verbal cues      Lower Body Dressing/Undressing Lower body dressing   What is the patient wearing?: Non-skid slipper socks, Pants   Underwear - Performed by helper: Thread/unthread right underwear leg, Thread/unthread left underwear leg, Pull underwear up/down Pants- Performed by patient: Thread/unthread right pants leg Pants- Performed by helper: Pull pants up/down, Thread/unthread left pants leg Non-skid slipper socks- Performed by patient: Don/doff right sock Non-skid slipper socks- Performed by helper: Don/doff left sock  Shoes - Performed by helper: Don/doff right shoe, Don/doff left shoe, Fasten right, Fasten left          Lower body assist Assist for lower body dressing: Touching or steadying assistance (Pt > 75%)      Toileting Toileting Toileting activity did not occur: No continent bowel/bladder  event   Toileting steps completed by helper: Performs perineal hygiene, Adjust clothing after toileting, Adjust clothing prior to toileting Toileting Assistive Devices: Grab bar or rail  Toileting assist Assist level: Two helpers   Transfers Chair/bed transfer   Chair/bed transfer method: Stand pivot Chair/bed transfer assist level: Moderate assist (Pt 50 - 74%/lift or lower) Chair/bed transfer assistive device: Mechanical lift Mechanical lift: Stedy   Locomotion Ambulation Ambulation activity did not occur: Safety/medical concerns   Max distance: 30 Assist level: Touching or steadying assistance (Pt > 75%)   Wheelchair Wheelchair activity did not occur: Safety/medical concerns Type: Motorized Max wheelchair distance: 50 Assist Level: Moderate assistance (Pt 50 - 74%)  Cognition Comprehension Comprehension assist level: Understands basic 90% of the time/cues < 10% of the time  Expression Expression assist level: Expresses basic 75 - 89% of the time/requires cueing 10 - 24% of the time. Needs helper to occlude trach/needs to repeat words.  Social Interaction Social Interaction assist level: Interacts appropriately 75 - 89% of the time - Needs redirection for appropriate language or to initiate interaction.  Problem Solving Problem solving assist level: Solves basic 25 - 49% of the time - needs direction more than half the time to initiate, plan or complete simple activities  Memory Memory assist level: Recognizes or recalls 25 - 49% of the time/requires cueing 50 - 75% of the time    Medical Problem List and Plan: 1.  Left hemiparesis, hemisensory loss,  and cognitive deficits secondary to right ACA infarct, small right thalamic infarct on 7/24  Cont CIR,PT, OT, SLP severe apraxia, reduced initiation, severe left neglect and awareness of deficits limiting improvement.2.  DVT Prophylaxis/Anticoagulation: Pharmaceutical: Lovenox 3. H/o lumbago/Pain Management: Denies pain at this  moment. Tylenol prn  -Knee pain Left hamstring overnall improving, may try diclofenac gel 4. Mood: LCSW to follow for evaluation and support.  5. Neuropsych: This patient is not capable of making decisions on his  own behalf. Trial ritalin increased dose Cont lexapro 6. Skin/Wound Care: routine pressure relief measures 7. Fluids/Electrolytes/Nutrition: Monitor I/Os--offer supplements if intake poor.   8.T2DM: monitor BS ac/hs. Continue lantus daily with meal coverage. Will use SSI for elevated BS.    CBG (last 3)   Recent Labs  03/07/17 1650 03/07/17 2101 03/08/17 0613  GLUCAP 125* 130* 77   on lantus  35U, am CBG low will dial back to 32U  Some lability early in weekend, better yesterday 9. HTN:  Monitor BP bid  Norvasc 10mg    Changed Hytrin back to flomax again will need to increase other meds  Hydralazine 10 started 8/13, increased to 50 TID Vitals:   03/07/17 1500 03/08/17 0519  BP: (!) 150/74 (!) 151/93  Pulse: (!) 102 96  Resp: 18 18  Temp: 99 F (37.2 C) 98.1 F (36.7 C)  SpO2: 97% 98%   10 H/o urinary retention: currently has foley as per urology 11. Hyperlipidemia: On atorvastatin.  12. Question CKD: baseline SCr around 1.6 per records review. Continue to monitor.  Offer fluids between meals for adequate hydration.   Cr. 1.18 on 8/15     Cont to monitor 13. Hyponatremia: Resolved   Sodium  139 on 8/15  Continue to monitor 14. Hypoalbuminemia  Supplement initiated 7/28 15. Acute blood loss anemia  Hemoglobin 10.6 on 8/15    Continue to monitor 16.  Spasticity- Left side , L hamstring most affected has daytime spasticity , Increased zanaflex to 2mg  BID and 4mg  Qhs,Botox 100 U to L hamstring on 8/5 17.  Urinary incont- occ retention now on Hytrin increase to 4mg  BID,   Previously failed flomax and place on hytrin to have a greater BP lowering effect   -cont foley  -urine with odor---Negative ua, ucs enteroccous probable contaminant, will cont short 5 d course  amoxicillin 18.  Bowel incont cognitive factors , daily soft stools   -ensure held due to concern it was making stools loose  LOS (Days) 25 A FACE TO FACE EVALUATION WAS PERFORMED  Erick Colace 03/08/2017 6:53 AM

## 2017-03-08 NOTE — Progress Notes (Signed)
Physical Therapy Session Note  Patient Details  Name: Frank Moss MRN: [id number] Date of Birth: 18-Jun-1971  Today's Date: 03/08/2017 PT Individual Time: 1300-1500 PT Individual Time Calculation (min): 120 min   Short Term Goals: Week 4:  PT Short Term Goal 1 (Week 4): =LTGs due to ELOS  Skilled Therapeutic Interventions/Progress Updates:    no c/o pain.  Session focus on w/c evaluation with Josh Cadle, ATP, LUE fine motor and strength tasks, transfers, and family education.    Pt participated in seating and mobility evaluation to determine appropriate seating system.  Transfers throughout session with overall min assist to R and L with max multimodal cues for initiation, sequencing, and safety.  Pt completed LUE clothespin task with more than a reasonable amount of time and mod cues to attend to task and use LUE only, focus on gross and pincher grasp, LUE coordination, and strength.    Pt's wife present for final portion of session.  PT provided extension education regarding DME recommended for d/c, home set up, expected level for ADLs (though recommended Pieter Partridge also discuss with OT), f/u with HHPT before progressing to outpatient, and demonstrated multiple stand/pivot transfers to R and L.  Pt returned to room in w/c at end of session with wife present, neuropsychologist entering as PT exiting.   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   Therapy/Group: Individual Therapy  Ladora Daniel Penven-Crew 03/08/2017, 4:34 PM

## 2017-03-08 NOTE — Plan of Care (Signed)
Problem: RH BOWEL ELIMINATION Goal: RH STG MANAGE BOWEL WITH ASSISTANCE STG Manage Bowel with Moderate Assistance to [facility] or bathroom.    Outcome: Not Progressing Pt continues to be incontinent of bowel and unaware of the need to defecate. Pt also does not call for assistance after incontinent episode. Pt states he is not aware he has had a BM when asked.

## 2017-03-08 NOTE — Progress Notes (Signed)
Speech Language Pathology Daily Session Note  Patient Details  Name: Frank Moss MRN: [id number] Date of Birth: 04-29-1971  Today's Date: 03/08/2017 SLP Individual Time: 1050-1115 SLP Individual Time Calculation (min): 25 min  Short Term Goals: Week 4: SLP Short Term Goal 1 (Week 4): STGs=LTGs  Skilled Therapeutic Interventions: Skilled ST treatment focused on cognitive goals. SLP facilitated recall, problem solving, error awareness and sustained attention skills, during recall of previous ST therapy tasks and medication management tasks. PT demonstrated delayed recall of previous ST session including, tasks completed and rules for the game and the number of medication he takes in the am/pm with supervision question cues. Pt demonstrated max impairment in problem solving, error awareness and sustained attention when initially filling medication pill box, however given instructional cues to fill from left to right and close the day after it was filled, in order to reduce errors, pt demonstrated Mod impairment given Mod question, verbal and visual cues. Pt was left in chair with call bell within reach. Recommend to continue ST services.     Function:  Cognition Comprehension Comprehension assist level: Understands basic 90% of the time/cues < 10% of the time  Expression   Expression assist level: Expresses basic 75 - 89% of the time/requires cueing 10 - 24% of the time. Needs helper to occlude trach/needs to repeat words.  Social Interaction    Problem Solving Problem solving assist level: Solves basic 25 - 49% of the time - needs direction more than half the time to initiate, plan or complete simple activities  Memory Memory assist level: Recognizes or recalls 75 - 89% of the time/requires cueing 10 - 24% of the time    Pain Pain Assessment Pain Assessment: No/denies pain Faces Pain Scale: Hurts little more Pain Type: Neuropathic pain Pain Location: Leg Pain Orientation: Left Pain  Descriptors / Indicators: Restless;Grimacing Pain Onset: With Activity Patients Stated Pain Goal: 2 Pain Intervention(s): Elevated extremity;Emotional support;Repositioned;Distraction Multiple Pain Sites: No  Therapy/Group: Individual Therapy  Stephone Gum  [facility] 03/08/2017, 12:38 PM

## 2017-03-08 NOTE — Progress Notes (Signed)
Occupational Therapy Session Note  Patient Details  Name: Frank Moss MRN: [id number] Date of Birth: 07/08/1971  Today's Date: 03/08/2017 OT Individual Time: 1000-1045 OT Individual Time Calculation (min): 45 min   Short Term Goals: Week 4:  OT Short Term Goal 1 (Week 4): LTG=STG 2/2 ELOS  Skilled Therapeutic Interventions/Progress Updates:    OT treatment session focused on toilet transfer training, LB dressing and L UE coordination. OT asked pt if he needed to use the bathroom, and pt stated yes. OT questioned if pt needed to urinate or have a BM and pt responded BM. Pt completed stand-pivot transfer to heavy duty drop arm BSC with Mod A to pivot. Pt needed min A while seated on commode to maintain sitting balance 2/2 tendency to extend hips forward and lean over to R. Pt with poor awareness of body position in space and poor safety awareness. Pt unable to have BM. Worked on hip hike for self-toileting, but difficulty successfully motor planning hip hike with reaching behind to wipe buttocks requiring total A to clean buttocks.  Therapy Documentation Precautions:  Precautions Precautions: Fall Precaution Comments: pusher to the left, apraxia, decreased initiation, left hemiparesis Restrictions Weight Bearing Restrictions: No Pain: Pain Assessment Pain Assessment: Faces Faces Pain Scale: Hurts little more Pain Type: Neuropathic pain Pain Location: Leg Pain Orientation: Left Pain Descriptors / Indicators: Restless;Grimacing Pain Onset: With Activity Patients Stated Pain Goal: 2 Pain Intervention(s): Elevated extremity;Emotional support;Repositioned;Distraction Multiple Pain Sites: No  See Function Navigator for Current Functional Status.   Therapy/Group: Individual Therapy  Frank Moss 03/08/2017, 10:31 AM

## 2017-03-08 NOTE — Plan of Care (Signed)
Problem: RH KNOWLEDGE DEFICIT Goal: RH STG INCREASE KNOWLEDGE OF DIABETES Patient and family will demonstrate increased knowledge of diabetes and associated medications, s/s of hypo/hyperglycemia, dietary restrictions, and follow-up with the MD post discharge with resources/reminders.   Outcome: Not Progressing No evidence of Pt learning. Unable to assess wife ability to manage pt, wife does visit with any regularity. Goal: RH STG INCREASE KNOWLEDGE OF HYPERTENSION Patient and family with demonstrate knowledge of HTN medications and dietary restrictions at discharge with reminders and resources.   Outcome: Not Progressing No evidence of Pt learning. Unable to assess wife ability to manage pt, wife does visit with any regularity.

## 2017-03-08 NOTE — Consult Note (Signed)
Patient:  Frank Moss   DOB: 1971-06-25  MR Number: [id number]  Location: [facility] [address] [facility] [id number] [place] [address] Dept: [phone] Loc: [phone]  Start: 3 PM End: 4 PM  Provider/Observer:        Chief Complaint:     No chief complaint on file.   Reason For Service:     Sergey Ishler was referred for neuropsychological consultation due to adjustment issues following an acute infarct of the right anterior thalamus and subacute right ACA infarct as well as chronic microvascular ischemic changes. There was some significant thalamic involvement also identified. This event occurred on 02/08/2017. The patient had a recent prior ACA stroke in generally the same region on 01/20/2017. At that point he had been admitted to [facility] and then referred to a skilled nursing facility for rehabilitation. Assessment for therapeutic options following the second vascular event indicated maximum assist level of therapy and training. He was recommended for the comprehensive inpatient rehabilitative program. The current neuropsychological evaluation was to assist in treatment planning and care with regard to adaption and coping with the recent multiple cerebrovascular events.  Interventions Strategy:  Working on neuropsychological issues that have presented both from acute CVA as well as what appear to be progressive microvascular changes that have been presenting with increasing neurological deficits over at least the past two years.  Participation Level:   Active  Participation Quality:  Inattentive and Redirectable      Behavioral Observation:  Well Groomed, Confused, and Tearful and laughing at different points of interaction..   Current Psychosocial Factors: The patient clearly is lacking clear awareness about the complexities and issues that are developed because of his cerebrovascular issues. Not  only has the patient had an acute stroke there are clear indications of older strokes as well as significant microvascular disease (small vessel disease) with observable atrophy and loss of brain mass.  Content of Session:   Reviewed current symptoms and any changes that have occurred since my initial visit on 02/23/2017. Also interacted and talked with the patient's wife about current symptoms as well as getting historical information.  Current Status:   The patient has continued to show some significant impulse control issues. The patient's wife acknowledges that he was having impulse control and other issues prior to this most recent stroke. The patient was hiding candy and cookies around the house and was having difficulties with organizing his thoughts to get to and from home or work. The patient's wife is also been very concerned about signs of depression but the lack of self-awareness is also confusing.  Patient Progress:   Stable   Impression/Diagnosis:   Shondell Poulson was referred for neuropsychological consultation due to adjustment issues following an acute infarct of the right anterior thalamus and subacute right ACA infarct as well as chronic microvascular ischemic changes. There was some significant thalamic involvement also identified. This event occurred on 02/08/2017. The patient had a recent prior ACA stroke in generally the same region on 01/20/2017. At that point he had been admitted to [facility] and then referred to a skilled nursing facility for rehabilitation. Assessment for therapeutic options following the second vascular event indicated maximum assist level of therapy and training. He was recommended for the comprehensive inpatient rehabilitative program. The current neuropsychological evaluation was to assist in treatment planning and care with regard to adaption and coping with the recent multiple cerebrovascular events.  During the initial  one-hour clinical  interview/interaction with the patient the patient did show only mild issues with regard to mental status. The patient was able to clearly understand questions and process information at a adequate level to benefit from rehabilitative efforts. There was a noted slowing and information processing speed and expressive language beat but the patient was able to adequately express ideas and thoughts clearly. The patient was rather drowsy during this visit and I do plan on going in to see him again at some point may be following one of his physical therapy sessions when he has been more active.  Today, the patient was much more alert and oriented at this level of interaction clearly allowed for more significant symptoms to be observable. The patient's wife was also there and was talking about some of the significant changes in the wife and his kids have noticed and the patient over the past couple of years. They've been noticing some significant cognitive lapses and other changes in personality over the past 2 years. This is likely related to small vessel disease that was not being managed. The patient had not been taking the medications that have been prescribed to him up to that point and apparently was eating a lot of candy and other junk food and keeping it from the family. He was not taking his medications. Some of these issues may have had to do with small vessel disease impacting frontal lobe regions and overall general loss of brain mass and atrophy.  I will plan on following up with the patient and referral has been made for outpatient follow-up.  Diagnosis:   Spastic hemiparesis (HCC) - Plan: Ambulatory referral to Physical Medicine Rehab  Acute thalamic infarction [facility]) - Plan: Ambulatory referral to Physical Medicine Rehab

## 2017-03-08 NOTE — Progress Notes (Signed)
Social Work Patient ID: Maki Enslen, male   DOB: 1970/08/12, 46 y.o.   MRN: [id number]  Spoke with Gabrille-pt's daughter who can come in tomorrow for education at 2:00 pm. Have gotten pt scheduled for 2:00-4:30 pm with all Three therapies. Will let team know and plan for RN to do education regarding insulin and blood sugar teaching tomorrow. See daughter when here tomorrow. Have asked for wife to come in also prior to pt's discharge.

## 2017-03-09 ENCOUNTER — Encounter (HOSPITAL_COMMUNITY): Payer: 59 | Admitting: Occupational Therapy

## 2017-03-09 ENCOUNTER — Inpatient Hospital Stay (HOSPITAL_COMMUNITY): Payer: 59 | Admitting: Speech Pathology

## 2017-03-09 ENCOUNTER — Inpatient Hospital Stay (HOSPITAL_COMMUNITY): Payer: 59 | Admitting: Occupational Therapy

## 2017-03-09 ENCOUNTER — Ambulatory Visit (HOSPITAL_COMMUNITY): Payer: 59 | Admitting: Physical Therapy

## 2017-03-09 LAB — GLUCOSE, CAPILLARY
GLUCOSE-CAPILLARY: 111 mg/dL — AB (ref 65–99)
GLUCOSE-CAPILLARY: 165 mg/dL — AB (ref 65–99)
Glucose-Capillary: 69 mg/dL (ref 65–99)
Glucose-Capillary: 103 mg/dL — ABNORMAL HIGH (ref 65–99)

## 2017-03-09 LAB — BASIC METABOLIC PANEL
Anion gap: 7 (ref 5–15)
BUN: 23 mg/dL — AB (ref 6–20)
CHLORIDE: 105 mmol/L (ref 101–111)
CO2: 27 mmol/L (ref 22–32)
Calcium: 9 mg/dL (ref 8.9–10.3)
Creatinine, Ser: 1.17 mg/dL (ref 0.61–1.24)
GFR calc Af Amer: >60 mL/min (ref >60)
GFR calc non Af Amer: >60 mL/min (ref >60)
Glucose, Bld: 74 mg/dL (ref 65–99)
POTASSIUM: 3.8 mmol/L (ref 3.5–5.1)
SODIUM: 139 mmol/L (ref 135–145)

## 2017-03-09 LAB — CBC
HEMATOCRIT: 35.2 % — AB (ref 39.0–52.0)
Hemoglobin: 11.7 g/dL — ABNORMAL LOW (ref 13.0–17.0)
MCH: 26.6 pg (ref 26.0–34.0)
MCHC: 33.2 g/dL (ref 30.0–36.0)
MCV: 80 fL (ref 78.0–100.0)
Platelets: 347 10*3/uL (ref 150–400)
RBC: 4.4 MIL/uL (ref 4.22–5.81)
RDW: 14.4 % (ref 11.5–15.5)
WBC: 6.9 10*3/uL (ref 4.0–10.5)

## 2017-03-09 MED ORDER — INSULIN GLARGINE 100 UNIT/ML ~~LOC~~ SOLN
30.0000 [IU] | Freq: Every day | SUBCUTANEOUS | Status: DC
  Administered 2017-03-09: 30 [IU] via SUBCUTANEOUS
  Filled 2017-03-09: qty 0.3

## 2017-03-09 NOTE — Progress Notes (Signed)
Speech Language Pathology Daily Session Note  Patient Details  Name: Frank Moss MRN: [id number] Date of Birth: Mar 27, 1971  Today's Date: 03/09/2017 SLP Individual Time: 1400-1430 SLP Individual Time Calculation (min): 30 min  Short Term Goals: Week 4: SLP Short Term Goal 1 (Week 4): STGs=LTGs  Skilled Therapeutic Interventions: Skilled treatment session focused on family education. SLP facilitated session by providing education to the patient's daughter and family in regards to his current cognitive deficits and strategies to utilize at home to maximize his problem solving, attention, awareness and recall in order to maximize his overall functional independence and safety. Family also educated on the importance of providing 24 hour supervision. All verbalized understanding of all information. Patient left upright in wheelchair with all needs within reach and family present. Continue with current plan of care.      Function:  Cognition Comprehension Comprehension assist level: Understands basic 90% of the time/cues < 10% of the time  Expression   Expression assist level: Expresses basic 75 - 89% of the time/requires cueing 10 - 24% of the time. Needs helper to occlude trach/needs to repeat words.  Social Interaction Social Interaction assist level: Interacts appropriately 75 - 89% of the time - Needs redirection for appropriate language or to initiate interaction.  Problem Solving Problem solving assist level: Solves basic 50 - 74% of the time/requires cueing 25 - 49% of the time  Memory Memory assist level: Recognizes or recalls 75 - 89% of the time/requires cueing 10 - 24% of the time    Pain No/Denies Pain   Therapy/Group: Individual Therapy  Marlicia Sroka 03/09/2017, 2:39 PM

## 2017-03-09 NOTE — Progress Notes (Signed)
Occupational Therapy Session Note  Patient Details  Name: Frank Moss MRN: [id number] Date of Birth: [birth year]  Today's Date: 03/09/2017  Session 1 OT Individual Time: 5631-4970 OT Individual Time Calculation (min): 45 min   Session 2 OT Individual Time: 2637-8588 OT Individual Time Calculation (min): 47 min    Short Term Goals: Week 4:  OT Short Term Goal 1 (Week 4): LTG=STG 2/2 ELOS  Skilled Therapeutic Interventions/Progress Updates:    Session 1 OT treatment session focused on L UE integration, modified bathing/dressing, and sitting/standing balance. Pt completed shower transfer using STEDY with min A sit<>stand.  Noted to be incontinent of BM when standing in STEDY. When asked, pt stated he did not feel he had had a BM nor did he feel like he needed to go more. Total A to wash buttocks standing in STEDY. Pt needed hand-over hand A 50% of the time to initiate and motor plan movement of L UE, then able to follow through with bathing task. Grooming tasks and UB dressing completed sitting in STEDY at the sink with focus on midline posture and functional use of L UE using mirror feedback. LB dressing seated in wc with increased time and instructional cues to attempt to thread L pant leg. Pt still needed assistance for LLE, but able to thread R LE, then stand with min A and was able to pull pants up over hips while OT facilitated anterior weight shift. Pt left seated in TIS wc with safety belt on and needs met.  Session 2 OT treatment session focused on pt/family education. Pt's daughter, her partner, and young son present for therapy session. Discussed home set-up and how to safely participate in BADLs at home. Re-iterated that pt will not be safe to access upstairs even with a stair lift and will be limited to sponge baths at this time. Demonstrated how to safely stand pt at the sink with pt's daughter and her partner getting hands on training of how to stand pt and safely assist him with  sponge baths. Demonstrated positioning techniques and body mechanics with squat-pivot and stand pivot transfers to drop arm BSC. Pt's daughter and her partner did not feel  They were ready to attempt hands on training with transfer. Demonstrated options for successful toileting and how to position pt safely on commode. Demonstrated how pt has been working on UB and LB dressing and ways to integrate L UE into daily tasks. Family asking how to assemble tub bench. Showed family what tub bench looks like and educated family again that a chair lift is not safe for pt to use to access upstairs bed and bath. Pt left with handoff to PT.   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   Therapy/Group: Individual Therapy  Valma Cava 03/09/2017, 3:51 PM

## 2017-03-09 NOTE — Progress Notes (Signed)
Occupational Therapy Discharge Summary  Patient Details  Name: Frank Moss MRN: [id number] Date of Birth: 02/20/1971   Patient has met 60 of 14 long term goals due to improved activity tolerance, improved balance, postural control, ability to compensate for deficits, functional use of  LEFT upper and LEFT lower extremity, improved attention, improved awareness and improved coordination.  Patient to discharge at overall min- Mod Assist level. On admission, he required total A of 2 so he has made significant progress.  Patient's care partner is independent to provide the necessary physical and cognitive assistance at discharge.    Reasons goals not met: n/a  Recommendation:  Patient will benefit from ongoing skilled OT services in home health setting to continue to advance functional skills in the area of BADL and Reduce care partner burden.  Equipment: tub transfer bench, heavy duty wide drop arm BSC, tilt in space wheelchair   Reasons for discharge: treatment goals met and discharge from hospital  Patient/family agrees with progress made and goals achieved: Yes  OT Discharge ADL ADL ADL Comments: min A - mod A overall Vision Baseline Vision/History: Wears glasses Wears Glasses: At all times Patient Visual Report: No change from baseline Eye Alignment: Within Functional Limits Ocular Range of Motion: Within Functional Limits Alignment/Gaze Preference: Within Defined Limits Tracking/Visual Pursuits: Decreased smoothness of vertical tracking;Decreased smoothness of horizontal tracking Perception  Inattention/Neglect: Does not attend to left side of body (improved from admission, now only needs min cues ) Praxis Praxis: Impaired Praxis Impairment Details: Ideomotor Cognition Overall Cognitive Status: Impaired/Different from baseline Sustained Attention: Impaired Sustained Attention Impairment: Verbal basic;Functional basic (min impaired) Memory Impairment: Retrieval  deficit;Decreased recall of new information Awareness Impairment: Emergent impairment Problem Solving: Impaired Problem Solving Impairment: Functional basic;Verbal basic Sensation Sensation Light Touch Impaired Details: Impaired LUE Stereognosis Impaired Details: Impaired LUE Hot/Cold: Appears Intact Proprioception Impaired Details: Impaired LUE;Impaired LLE Coordination Gross Motor Movements are Fluid and Coordinated: No Fine Motor Movements are Fluid and Coordinated: No Coordination and Movement Description: mod imp LUE motor planning Motor  Motor Motor - Discharge Observations: motor apraxia, abnormal tone Mobility    mod A with stand pivot transfers Trunk/Postural Assessment  Postural Control Trunk Control: improved from admission, min to mod A to support balance in standing due to L lean  Balance Static Sitting Balance Static Sitting - Level of Assistance: 5: Stand by assistance Dynamic Sitting Balance Dynamic Sitting - Level of Assistance: 4: Min assist Static Standing Balance Static Standing - Level of Assistance: 4: Min assist Extremity/Trunk Assessment RUE Assessment RUE Assessment: Within Functional Limits LUE Strength LUE Overall Strength Comments: Pt now has full AROM and strength, but due to decreased proprioception and praxis, he needs min A to use at a diminished level   See Function Navigator for Current Functional Status.  [place] 03/10/2017, 12:25 PM

## 2017-03-09 NOTE — Progress Notes (Signed)
Physical Therapy Session Note  Patient Details  Name: Frank Moss MRN: [id number] Date of Birth: 07-16-1971  Today's Date: 03/09/2017 PT Individual Time: 6788-9338 PT Individual Time Calculation (min): 70 min   Short Term Goals: Week 4:  PT Short Term Goal 1 (Week 4): =LTGs due to ELOS  Skilled Therapeutic Interventions/Progress Updates:    Session focus on hands on family training for transfers, activity tolerance, and cognition.    Pt's daughter, Frank Moss, present for hands on training for first part of session.  PT provided education for w/c at d/c, and verbal/demonstrative cues for car transfer and w/c<>bed transfer.  Frank Moss provided excellent hands on assist and multimodal cues for transfers throughout session with minimal verbal cues from PT.  She asked appropriate questions and PT answered within scope.    Bed mobility for changing soiled brief with total assist for hygiene and brief management.  Pt requires supervision to roll to L and continues to require max assist to roll to R 2/2 apraxia.    Remainder of session focus on attention, visual scanning, standing tolerance, and weight shift during standing dynavision task.  Pt completes 2 trials with rest break in between, mode A-60 second trial, and scored 25 and 36 hits.  Pt able to recall purpose and "rules" for dynavision when prompted without cues (last time pt completed dynavision was on 02/14/17 and he scored 11 hits and 10 hits in 60 seconds).    Pt returned to room at end of session and positioned in w/c with QRB in place, call bell in reach and needs met.   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   Therapy/Group: Individual Therapy  Michel Santee 03/09/2017, 4:24 PM

## 2017-03-09 NOTE — Patient Care Conference (Signed)
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 Labile blood pressure   . Labile blood glucose   . 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 Diabetes mellitus type 2 in nonobese (HCC)   . Hypertensive crisis   . Diabetes mellitus (HCC)   . Benign essential HTN   . Urinary retention   . Stage 3 chronic kidney disease   . Hyponatremia   . Hypoalbuminemia due to protein-calorie malnutrition (HCC)   . Acute blood loss anemia   . Acute thalamic infarction (HCC) 02/11/2017  . Spastic hemiparesis (HCC) 02/10/2017  . Cognitive deficit due to recent stroke 02/10/2017  . Hemi-neglect of left side 02/10/2017  . CKD (chronic kidney disease), stage III 02/09/2017  . Dysphagia as late effect of stroke 02/08/2017  . Acute ischemic right ACA stroke (HCC) 01/31/2017  . Mixed hyperlipidemia 11/17/2007  . Accelerated hypertension 11/17/2007  . DM (diabetes mellitus), type 2 with peripheral vascular complications (HCC) 10/09/2007  . ERECTILE DYSFUNCTION, MILD 10/09/2007  . SHOULDER PAIN, LEFT 10/09/2007  . COLONIC POLYPS, HX OF 10/09/2007    Expected Discharge Date: Expected Discharge Date: 03/11/17  Team Members Present: Physician leading conference: Dr. Claudette Laws Social Worker Present: Dossie Der, LCSW Nurse Present: Other (comment) Jacki Cones Dunnigan-RN) PT  Present: Teodoro Kil, PT OT Present: Kearney Hard, OT SLP Present: Feliberto Gottron, SLP PPS Coordinator present : Tora Duck, RN, CRRN     Current Status/Progress Goal Weekly Team Focus  Medical   Dysarthria, neglect poor awareness, foley place  Maintain med stability ,   stopped urecholine, d/c planning   Bowel/Bladder   Incontinent of bowel, LBM 03/08/17, indwelling foley catheter, flomax started 03/07/17  less episodes of incontinence  continue to monitor, discharge plan for 03/11/17   Swallow/Nutrition/ Hydration             ADL's   Mod A overall  Min A overall  L NMR, pt/family education, standing balance, transfer taining, dc planning   Mobility   min for stand/pivot transfers, min/mod for gait at rail   min A bed mobility and transfers, mod A ambulaiton with LRAD, supervision w/c mobility  L attention, initiation, motor planning, transfers, hands on family education    Communication             Safety/Cognition/ Behavioral Observations  Mod-Max A   Mod A  Family Education    Pain   c/o pain to catheter site due to patient pulling catheter, has tylenol prn & scheduled zanaflex  pain scale <2  continue to assess & treat as needed   Skin   no areas of skin breakdown  no new areas of skin break down  continue to assess q shift      *See Care Plan and progress notes for long and short-term goals.     Barriers to Discharge  Current Status/Progress Possible Resolutions Date Resolved   Physician    Inaccessible home environment;Home environment access/layout;Incontinence  left neglect, apraxia, poor sensation  no neurologic changes  cont d/c planning with family , needs ramp at home      Nursing  Incontinence;Neurogenic Bowel & Bladder;Lack of/limited family support;Medication compliance;Behavior  patient is hard to direct at times & has no control of his left arm, consistently manipulates devices & disables items against advice & tries to get up unassisted, pulls at  foley catheter            PT                    OT                  SLP                SW                Discharge Planning/Teaching Needs:  Daughter to be here this afternoon to do hands on care with pt. Ramp beign built for home 8/21. Family aware  pt will require 24 hr care at discharge. Neuro-psych saw 8/21      Team Discussion:  Foley placed and will follow up with urologist in one month. Progressing toward his min assist level and will begin family with daughter and partner this afternoon. HTN better controlled MD made adjustments to meds. Continues with left inattention, awareness and apraxia. Neuro-psych saw yesterday with wife present to answer her questions and will follow as an OP. Preparing for DC 8/24. Will require 24 hr care.  Revisions to Treatment Plan:  DC 8/24    Continued Need for Acute Rehabilitation Level of Care: The patient requires daily medical management by a physician with specialized training in physical medicine and rehabilitation for the following conditions: Daily direction of a multidisciplinary physical rehabilitation program to ensure safe treatment while eliciting the highest outcome that is of practical value to the patient.: Yes Daily medical management of patient stability for increased activity during participation in an intensive rehabilitation regime.: Yes Daily analysis of laboratory values and/or radiology reports with any subsequent need for medication adjustment of medical intervention for : Neurological problems;Urological problems  Vernesha Talbot, Lemar Livings 03/09/2017, 2:56 PM

## 2017-03-09 NOTE — Progress Notes (Signed)
Patient still plays with his foley catheter. Since the insertion, patient has removed all securement devices. He can be directed at first, but soon after the conversation, returns to previous behavior. Patient swings his legs out of the bed, tried to get up from his chair, remove his seatbelt and dismantle objects like his wheelchair. During transfers, patient does not follow directions well. He tries to climb out of the stedy while in use, removes his hands from holding the handle, lifts his feet off the foot rests and stands up while the stedy is being moved. He needs constant cuing and reorientation. It is difficult, even with 2 assists to toilet patient. His left arm is the arm that he has no control of, but his left hand gravitates to his brief & private area. He constantly opens his brief and pulls on his penis. Patient stated that he has no control over it, but no matter how firmly the brief is closed or the blankets tucked, he can still get top his brief. Patient has also been noted in the last few days to have opened his brief on the right side & smeared feces on himself & the bed. The right arm is the arm that he can control, but it is also the one that removed the securement devices & smeared the feces. Patient is alert & oriented at times. Explained the foley catheter to the patient & how it is held in place, but patient still pulls on it & states that it hurts. He needs constant direction. Will continue to monitor for changes & encourage.

## 2017-03-09 NOTE — Progress Notes (Signed)
[facility] PHYSICAL MEDICINE & REHABILITATION     PROGRESS NOTE  Subjective/Complaints:  No issues overnite, pt somnolent, cannot identify me  ROS- cannot obtain due to cognitive status Objective: Vital Signs: Blood pressure 140/67, pulse 97, temperature 98.2 F (36.8 C), temperature source Oral, resp. rate 20, height 6' 2"  (1.88 m), weight 103 kg (227 lb 0.7 oz), SpO2 98 %. No results found.  Recent Labs  03/09/17 0504  WBC 6.9  HGB 11.7*  HCT 35.2*  PLT 347    Recent Labs  03/09/17 0504  NA 139  K 3.8  CL 105  GLUCOSE 74  BUN 23*  CREATININE 1.17  CALCIUM 9.0   CBG (last 3)   Recent Labs  03/08/17 1640 03/08/17 2036 03/09/17 0701  GLUCAP 139* 195* 69    Wt Readings from Last 3 Encounters:  03/02/17 103 kg (227 lb 0.7 oz)  02/08/17 103.7 kg (228 lb 11.2 oz)  02/08/17 124.3 kg (274 lb)    Physical Exam:  BP 140/67 (BP Location: Right Arm)   Pulse 97   Temp 98.2 F (36.8 C) (Oral)   Resp 20   Ht 6' 2"  (1.88 m)   Wt 103 kg (227 lb 0.7 oz)   SpO2 98%   BMI 29.15 kg/m  Constitutional: He appears well-developedand well-nourished.  HENT: Normocephalicand atraumatic.  Eyes: EOMI. No discharge. Cardiovascular: RRR without murmur. No JVD . Respiratory: CTA Bilaterally without wheezes or rales. Normal effort .  GI: Soft. Bowel sounds are normal.   Neurological: He is alertand oriented.  Left facial weakness with left gaze preference and right neglect, stable.  Delayed processing with slow speech.  Lacks awareness/insight into deficits. Spastic left hemiparesis  Motor: Left lower extremity: 2/5 proximal to distal Moving LUE spontaneously only.  Skin: Skin is warmand dry.  Psychiatric: His affect is blunt. His speech is delayed. He is slowed. He expresses inappropriate judgment. He is inattentive.  Opens eyes briefly to command  Assessment/Plan: 1. 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 Physiatrist is providing close team supervision and 24 hour management of active medical problems listed below. Physiatrist and rehab team continue to assess barriers to discharge/monitor patient progress toward functional and medical goals.  Function:  Bathing Bathing position   Position: Wheelchair/chair at sink  Bathing parts Body parts bathed by patient: Right arm, Left arm, Abdomen, Chest, Front perineal area, Right upper leg, Left upper leg, Buttocks, Right lower leg Body parts bathed by helper: Left lower leg, Back  Bathing assist Assist Level: Touching or steadying assistance(Pt > 75%)      Upper Body Dressing/Undressing Upper body dressing   What is the patient wearing?: Pull over shirt/dress     Pull over shirt/dress - Perfomed by patient: Thread/unthread right sleeve, Put head through opening, Pull shirt over trunk, Thread/unthread left sleeve Pull over shirt/dress - Perfomed by helper: Thread/unthread left sleeve        Upper body assist Assist Level: Supervision or verbal cues      Lower Body Dressing/Undressing Lower body dressing   What is the patient wearing?: Non-skid slipper socks, Pants   Underwear - Performed by helper: Thread/unthread right underwear leg, Thread/unthread left underwear leg, Pull underwear up/down Pants- Performed by patient: Thread/unthread right pants leg Pants- Performed by helper: Pull pants up/down, Thread/unthread left pants leg Non-skid slipper socks- Performed by patient: Don/doff right sock Non-skid  slipper socks- Performed by helper: Don/doff left sock       Shoes - Performed by helper: Don/doff right shoe, Don/doff left shoe, Fasten right, Fasten left          Lower body assist Assist for lower body dressing: Touching or steadying assistance (Pt > 75%)      Toileting Toileting Toileting activity did not occur: No continent bowel/bladder  event   Toileting steps completed by helper: Adjust clothing prior to toileting, Adjust clothing after toileting, Performs perineal hygiene Toileting Assistive Devices: Grab bar or rail  Toileting assist Assist level: Touching or steadying assistance (Pt.75%)   Transfers Chair/bed transfer   Chair/bed transfer method: Stand pivot Chair/bed transfer assist level: Touching or steadying assistance (Pt > 75%) Chair/bed transfer assistive device: Mechanical lift Mechanical lift: Stedy   Locomotion Ambulation Ambulation activity did not occur: Safety/medical concerns   Max distance: 30 Assist level: Touching or steadying assistance (Pt > 75%)   Wheelchair Wheelchair activity did not occur: Safety/medical concerns Type: Motorized Max wheelchair distance: 50 Assist Level: Moderate assistance (Pt 50 - 74%)  Cognition Comprehension Comprehension assist level: (P) Understands basic 90% of the time/cues < 10% of the time  Expression Expression assist level: (P) Expresses basic 75 - 89% of the time/requires cueing 10 - 24% of the time. Needs helper to occlude trach/needs to repeat words.  Social Interaction Social Interaction assist level: Interacts appropriately 75 - 89% of the time - Needs redirection for appropriate language or to initiate interaction.  Problem Solving Problem solving assist level: Solves basic 25 - 49% of the time - needs direction more than half the time to initiate, plan or complete simple activities  Memory Memory assist level: Recognizes or recalls 75 - 89% of the time/requires cueing 10 - 24% of the time    Medical Problem List and Plan: 1.  Left hemiparesis, hemisensory loss,  and cognitive deficits secondary to right ACA infarct, small right thalamic infarct on 7/24  Cont CIR,PT, OT, SLP severe apraxia, reduced initiation, severe left neglect and awareness of deficits limiting improvement,Team conference today please see physician documentation under team conference  tab, met with team face-to-face to discuss problems,progress, and goals. Formulized individual treatment plan based on medical history, underlying problem and comorbidities.2.  DVT Prophylaxis/Anticoagulation: Pharmaceutical: Lovenox 3. H/o lumbago/Pain Management: Denies pain at this moment. Tylenol prn  -Knee pain Left hamstring overnall improving, may try diclofenac gel 4. Mood: LCSW to follow for evaluation and support.  5. Neuropsych: This patient is not capable of making decisions on his  own behalf. Trial ritalin increased dose Cont lexapro 6. Skin/Wound Care: routine pressure relief measures 7. Fluids/Electrolytes/Nutrition: Monitor I/Os--offer supplements if intake poor.   8.T2DM: monitor BS ac/hs. Continue lantus daily with meal coverage. Will use SSI for elevated BS.    CBG (last 3)   Recent Labs  03/08/17 1640 03/08/17 2036 03/09/17 0701  GLUCAP 139* 195* 69   on lantus  35U, am CBG low will dial back to 30U   9. HTN:  Monitor BP bid  Norvasc 78m   Changed Hytrin back to flomax again will need to increase other meds  Hydralazine 10 started 8/13, increased to 50 TID, controlled 8/21 Vitals:   03/08/17 1536 03/09/17 0539  BP: (!) 160/81 140/67  Pulse: 94 97  Resp: 20 20  Temp: 98.6 F (37 C) 98.2 F (36.8 C)  SpO2: 100% 98%   10 H/o urinary retention: currently has foley as per urology 11. Hyperlipidemia:  On atorvastatin.  12. Question CKD: baseline SCr around 1.6 per records review. Continue to monitor.  Offer fluids between meals for adequate hydration.   Cr. 1.18 on 8/15     Cont to monitor 13. Hyponatremia: Resolved   Sodium 139 on 8/15  Continue to monitor 14. Hypoalbuminemia  Supplement initiated 7/28 15. Acute blood loss anemia  Hemoglobin 10.6 on 8/15    Continue to monitor 16.  Spasticity- Left side , L hamstring most affected has daytime spasticity , Increased zanaflex to 281m BID and 471mQhs,Botox 100 U to L hamstring on 8/5 17.  Urinary incont-  occ retention now on Hytrin increase to 81m79mID,   Previously failed flomax and place on hytrin to have a greater BP lowering effect   -cont foley  -urine with odor---Negative ua, ucs enteroccous probable contaminant, will cont short 5 d course amoxicillin 18.  Bowel incont cognitive factors , daily soft stools   -ensure held due to concern it was making stools loose  LOS (Days) 26 A FACE TO FACE EVALUATION WAS PERFORMED  KIRCharlett Blake22/2018 7:29 AM

## 2017-03-09 NOTE — Progress Notes (Signed)
Social Work Patient ID: Frank Moss, male   DOB: Aug 11, 1970, 46 y.o.   MRN: [id number]  Met with pt and daughter along with her partner who were here for family education with PT,OT and SP. All report it went well and everything was done that needed to be and daughter did hands on care. Daughter felt comfortable with the care and will report back to her mom-pt's wife. Aware team conference And plan for discharge on Friday. Hoping the bed and bsc will be delivered to home tomorrow, wheelchair will be taken to the home also. Touch base with wife in am regarding equipment delivery tomorrow.

## 2017-03-10 ENCOUNTER — Inpatient Hospital Stay (HOSPITAL_COMMUNITY): Payer: 59 | Admitting: Speech Pathology

## 2017-03-10 ENCOUNTER — Inpatient Hospital Stay (HOSPITAL_COMMUNITY): Payer: 59 | Admitting: Physical Therapy

## 2017-03-10 ENCOUNTER — Inpatient Hospital Stay (HOSPITAL_COMMUNITY): Payer: 59 | Admitting: Occupational Therapy

## 2017-03-10 LAB — GLUCOSE, CAPILLARY
GLUCOSE-CAPILLARY: 66 mg/dL (ref 65–99)
GLUCOSE-CAPILLARY: 90 mg/dL (ref 65–99)
GLUCOSE-CAPILLARY: 100 mg/dL — AB (ref 65–99)
Glucose-Capillary: 143 mg/dL — ABNORMAL HIGH (ref 65–99)
Glucose-Capillary: 142 mg/dL — ABNORMAL HIGH (ref 65–99)

## 2017-03-10 MED ORDER — AMOXICILLIN 250 MG PO CAPS: 250.0000 mg | ORAL_CAPSULE | Freq: Three times a day (TID) | ORAL | 0 refills | Status: DC

## 2017-03-10 MED ORDER — AMLODIPINE BESYLATE 10 MG PO TABS: 10.0000 mg | ORAL_TABLET | Freq: Every day | ORAL | 0 refills | Status: AC

## 2017-03-10 MED ORDER — TIZANIDINE HCL 4 MG PO TABS: 4.0000 mg | ORAL_TABLET | Freq: Every day | ORAL | 0 refills | Status: DC

## 2017-03-10 MED ORDER — ESCITALOPRAM OXALATE 10 MG PO TABS: 10.0000 mg | ORAL_TABLET | Freq: Every day | ORAL | 0 refills | Status: DC

## 2017-03-10 MED ORDER — ATORVASTATIN CALCIUM 80 MG PO TABS: 80.0000 mg | ORAL_TABLET | Freq: Every day | ORAL | 0 refills | Status: DC

## 2017-03-10 MED ORDER — INSULIN GLARGINE 100 UNIT/ML ~~LOC~~ SOLN
25.0000 [IU] | Freq: Every day | SUBCUTANEOUS | Status: DC
  Administered 2017-03-10: 25 [IU] via SUBCUTANEOUS
  Filled 2017-03-10 (×2): qty 0.25

## 2017-03-10 MED ORDER — METHYLPHENIDATE HCL 5 MG PO TABS: 5.0000 mg | ORAL_TABLET | Freq: Two times a day (BID) | ORAL | 0 refills | Status: DC

## 2017-03-10 MED ORDER — CLOPIDOGREL BISULFATE 75 MG PO TABS: 75.0000 mg | ORAL_TABLET | Freq: Every day | ORAL | 0 refills | Status: DC

## 2017-03-10 MED ORDER — TAMSULOSIN HCL 0.4 MG PO CAPS: 0.4000 mg | ORAL_CAPSULE | Freq: Every day | ORAL | 0 refills | Status: DC

## 2017-03-10 MED ORDER — METHYLPHENIDATE HCL 5 MG PO TABS
5.0000 mg | ORAL_TABLET | Freq: Two times a day (BID) | ORAL | Status: DC
  Administered 2017-03-10 – 2017-03-11 (×4): 5 mg via ORAL
  Filled 2017-03-10 (×4): qty 1

## 2017-03-10 MED ORDER — HYDRALAZINE HCL 50 MG PO TABS: 50.0000 mg | ORAL_TABLET | Freq: Three times a day (TID) | ORAL | 0 refills | Status: DC

## 2017-03-10 MED ORDER — TIZANIDINE HCL 2 MG PO TABS: 2.0000 mg | ORAL_TABLET | Freq: Two times a day (BID) | ORAL | 0 refills | Status: DC

## 2017-03-10 NOTE — Progress Notes (Signed)
Physical Therapy Discharge Summary  Patient Details  Name: Frank Moss MRN: [id number] Date of Birth: 22-Jun-1971  Today's Date: 03/10/2017 PT Individual Time: 9179-1505 PT Individual Time Calculation (min): 70 min    Patient has met 5 of 5 long term goals due to improved activity tolerance, improved balance, improved postural control, ability to compensate for deficits and improved attention.  Patient to discharge at a wheelchair level min to mod assist, dependent on fatigue and attention.   Patient's care partner is independent to provide the necessary physical and cognitive assistance at discharge.  Recommendation:  Patient will benefit from ongoing skilled PT services in home health setting to continue to advance safe functional mobility, address ongoing impairments in balance, postural control, motor planning, apraxia, proprioception, cognition, and safety awareness, and minimize fall risk.  Equipment: tilt in space wheelchair  Reasons for discharge: treatment goals met  Patient/family agrees with progress made and goals achieved: Yes   Skilled PT Intervention: Session focus on standing tolerance, L hamstring stretch, w/c mobility, and transfers for functional ADL tasks.    Pt continues to require min>mod assist for transfers L and R dependent on fatigue and distractions in environment.  PROM stretch to L heel cords and hamstrings 3x30 seconds.  Pt able to tolerate standing x2 trials in standing frame (without sling for support) focus on extended hamstring stretch in standing during table top task.  W/C propulsion with BLEs x50' with min assist and max multimodal cues to maintain straight path and stay attended to task.  Pt noted to be incontinent of bowel, brief had slipped off so required significant assist for clothing management and hygiene.  Pt performs multiple sit<>stand transfers with STEDY and supervision, total assist for clothing management and hygiene.  Gait training x30'  with rail in hallway and min assist.  Pt returned to room at end of session and positioned in w/c with call bell in reach and needs met.   PT Discharge Precautions/Restrictions Precautions Precautions: Fall Precaution Comments: L pusher, severe apraxia, decreased initiation, absent proprioception/sensation in LUE Restrictions Weight Bearing Restrictions: No Pain Pain Assessment Pain Assessment: No/denies pain Vision/Perception  Perception Perception: Impaired Inattention/Neglect: Does not attend to left visual field;Does not attend to left side of body Praxis Praxis: Impaired Praxis Impairment Details: Initiation;Ideomotor;Motor planning;Perseveration  Cognition Overall Cognitive Status: Impaired/Different from baseline Arousal/Alertness: Awake/alert Orientation Level: Oriented to person;Oriented to place;Oriented to situation Attention: Sustained Focused Attention: Appears intact Sustained Attention: Impaired Sustained Attention Impairment: Verbal basic;Functional basic Memory: Impaired Memory Impairment: Retrieval deficit;Decreased recall of new information Awareness: Impaired Awareness Impairment: Emergent impairment Problem Solving: Impaired Problem Solving Impairment: Functional basic;Verbal basic Sequencing: Impaired Sequencing Impairment: Verbal basic;Functional basic Behaviors: Restless;Perseveration (flat affect) Safety/Judgment: Impaired Sensation Sensation Light Touch: Impaired Detail Light Touch Impaired Details: Impaired LLE;Absent LUE Proprioception: Impaired Detail Proprioception Impaired Details: Absent LUE;Absent LLE Additional Comments: unable to detect Light touch or propioception in LLE Coordination Gross Motor Movements are Fluid and Coordinated: No Fine Motor Movements are Fluid and Coordinated: No Motor  Motor Motor: Abnormal tone;Hemiplegia;Motor apraxia;Abnormal postural alignment and control;Motor perseverations Motor - Discharge  Observations: L pusher, severe motor apraxia  Mobility Bed Mobility Rolling Right: 2: Max assist Rolling Left: 5: Supervision;With rail Supine to Sit: 3: Mod assist Sit to Supine: 4: Min assist Transfers Transfers: Yes Sit to Stand: 4: Min assist Sit to Stand Details: Tactile cues for placement;Visual cues for safe use of DME/AE;Visual cues/gestures for precautions/safety;Visual cues/gestures for sequencing;Verbal cues for sequencing;Verbal cues for technique;Verbal cues for safe  use of DME/AE;Verbal cues for precautions/safety;Manual facilitation for weight shifting;Manual facilitation for placement;Manual facilitation for weight bearing Stand to Sit: 4: Min assist Stand to Sit Details (indicate cue type and reason): Visual cues/gestures for sequencing;Verbal cues for sequencing;Verbal cues for technique;Verbal cues for precautions/safety;Verbal cues for safe use of DME/AE;Manual facilitation for weight shifting Stand Pivot Transfers: 4: Min assist Locomotion  Ambulation Ambulation: Yes Ambulation/Gait Assistance: 4: Min assist Ambulation Distance (Feet): 30 Feet Assistive device:  (rail in hallway) Ambulation/Gait Assistance Details: Tactile cues for posture Gait Gait: Yes Gait Pattern: Impaired Gait Pattern: Narrow base of support;Step-to pattern;Decreased step length - left;Decreased stride length;Left flexed knee in stance;Antalgic Stairs / Additional Locomotion Stairs: No Architect: Yes Wheelchair Assistance: 4: Energy manager: Both lower extermities Wheelchair Parts Management: Needs assistance Distance: 50  Trunk/Postural Assessment  Cervical Assessment Cervical Assessment: Exceptions to [facility] (cervical protraction, R gaze preference) Thoracic Assessment Thoracic Assessment: Exceptions to [facility] (L lateral trunk flexion, kyphotic) Lumbar Assessment Lumbar Assessment: Exceptions to [facility] (fixed posterior pelvic tilt, can correct  some but not to neutral) Postural Control Postural Control: Deficits on evaluation Righting Reactions: delayed and insufficient Protective Responses: severely delayed and insufficient Postural Limitations: decreased   Balance Static Sitting Balance Static Sitting - Balance Support: Feet supported;Right upper extremity supported Static Sitting - Level of Assistance: 5: Stand by assistance Dynamic Sitting Balance Dynamic Sitting - Balance Support: Feet supported;During functional activity Dynamic Sitting - Level of Assistance: 4: Min Insurance risk surveyor Standing - Balance Support: Bilateral upper extremity supported;Right upper extremity supported;During functional activity Static Standing - Level of Assistance: 4: Min assist Extremity Assessment      RLE Assessment RLE Assessment: Within Functional Limits LLE Assessment LLE Assessment: Exceptions to [facility] LLE Strength LLE Overall Strength Comments: strength WFL, severe proprioceptive and sensation deficits as well as severe apraxia LLE Tone LLE Tone: Hypertonic LLE Tone Comments: pt with hamstring  muscle shortening with severe spasms when stretched quickly   See Function Navigator for Current Functional Status.  Michel Santee 03/10/2017, 4:35 PM

## 2017-03-10 NOTE — Progress Notes (Signed)
Speech Language Pathology Session Note & Discharge Summary  Patient Details  Name: Frank Moss MRN: [id number] Date of Birth: May 04, 1971  Today's Date: 03/10/2017 SLP Individual Time: 0915-1010 SLP Individual Time Calculation (min): 55 min   Skilled Therapeutic Interventions:   Skilled treatment session focused on cognitive goals. Upon arrival, patient was asleep while supine in bed. Patient required extra time for arousal and Max-Total A verbal and tactile cues for bed mobility during dressing. Patient also required Mod A verbal cues for sequencing with transfer to the wheelchair via the [facility].  SLP facilitated session by re-administering the MoCA (version 7.3). Patient scored 18/30 points with a score of 26 or above considered normal. Patient continues to demonstrate deficits in attention, short-term recall and executive functioning. Patient handed off to OT. Continue with current plan of care.   Patient has met 2 of 3 long term goals.  Patient to discharge at overall Mod;Max level.   Reasons goals not met: Patient continues to require Max A verbal cues for recall    Clinical Impression/Discharge Summary: Patient has made minimal and inconsistent progress and has met 2 of 3 LTG's this admission. Currently, patient requires overall Mod-Max A verbal cues for problem solving, recall and attention to complete functional and familiar tasks safely. Patient and family education is complete and patient will discharge home with 24 hour supervision. Patient would benefit from f/u SLP services to maximize cognitive function and overall functional independence prior to discharge.   Care Partner:  Caregiver Able to Provide Assistance: Yes  Type of Caregiver Assistance: Physical;Cognitive  Recommendation:  24 hour supervision/assistance;Home Health SLP  Rationale for SLP Follow Up: Maximize cognitive function and independence;Reduce caregiver burden   Equipment: N/A   Reasons for discharge:  Discharged from hospital   Patient/Family Agrees with Progress Made and Goals Achieved: Yes   Function:   Cognition Comprehension Comprehension assist level: Understands basic 90% of the time/cues < 10% of the time  Expression   Expression assist level: Expresses basic 75 - 89% of the time/requires cueing 10 - 24% of the time. Needs helper to occlude trach/needs to repeat words.  Social Interaction Social Interaction assist level: Interacts appropriately 50 - 74% of the time - May be physically or verbally inappropriate.  Problem Solving Problem solving assist level: Solves basic 50 - 74% of the time/requires cueing 25 - 49% of the time  Memory Memory assist level: Recognizes or recalls 50 - 74% of the time/requires cueing 25 - 49% of the time   Arrie Borrelli 03/10/2017, 3:11 PM

## 2017-03-10 NOTE — Progress Notes (Signed)
Occupational Therapy Session Note  Patient Details  Name: Frank Moss MRN: [id number] Date of Birth: Jun 20, 1971  Today's Date: 03/10/2017 OT Individual Time: 1010-1130 OT Individual Time Calculation (min): 80 min    Short Term Goals: Week 1:  OT Short Term Goal 1 (Week 1): Pt will maintain static sitting balance EOB with supervision for 3 mins in preparation for transfers or selfcare.  OT Short Term Goal 1 - Progress (Week 1): Met OT Short Term Goal 2 (Week 1): Pt will maintain dynamic sitting balance unsupported EOB or EOC with min assist during bathing and dressing tasks.  OT Short Term Goal 2 - Progress (Week 1): Met OT Short Term Goal 3 (Week 1): Pt will complete UB dressing with min assist to donn pullover shirt.  OT Short Term Goal 3 - Progress (Week 1): Progressing toward goal OT Short Term Goal 4 (Week 1): Pt will donn pull up pants with mod assist sit to stand.  OT Short Term Goal 4 - Progress (Week 1): Progressing toward goal OT Short Term Goal 5 (Week 1): Pt will use the LUE as an active assist for bathing tasks with no more than mod assist.  OT Short Term Goal 5 - Progress (Week 1): Met Week 2:  OT Short Term Goal 1 (Week 2): Pt will complete UB dressing with min assist to donn pullover shirt.  OT Short Term Goal 1 - Progress (Week 2): Met OT Short Term Goal 2 (Week 2): Pt will donn pull up pants with mod assist sit to stand.  OT Short Term Goal 2 - Progress (Week 2): Progressing toward goal OT Short Term Goal 3 (Week 2): Pt will initiate use of L UE during bathing tasks with min questioning cue 75% of tasks OT Short Term Goal 3 - Progress (Week 2): Progressing toward goal OT Short Term Goal 4 (Week 2): Pt will locate 2/4 grooming items on L side of sink with min cues OT Short Term Goal 4 - Progress (Week 2): Met Week 3:  OT Short Term Goal 1 (Week 3): Pt will initiate use of L UE during bathing tasks with min questioning cue 75% of tasks OT Short Term Goal 1 - Progress  (Week 3): Met OT Short Term Goal 2 (Week 3): Pt will donn pull up pants with mod assist sit to stand.  OT Short Term Goal 2 - Progress (Week 3): Met OT Short Term Goal 3 (Week 3): Pt will complete toilet transfer with consistent Mod A OT Short Term Goal 3 - Progress (Week 3): Progressing toward goal Week 4:  OT Short Term Goal 1 (Week 4): LTG=STG 2/2 ELOS     Skilled Therapeutic Interventions/Progress Updates:    Pt seen for BADL retraining to reinforce skills he has acquired to prepare for discharge tomorrow. No family present today.  Pt was in w/c at start of session. Pt will need to do sponge baths at home so bathed from w/c this session. Pt demonstrated improved attention to task, use of LUE and sitting balance during bathing and dressing. He was able to stand up with min A and managed his pants over his hips.  He continues to need cues to focus on his L hand.  He worked on stand pivot transfers to [facility] with mod A (with 2nd person present for safety).  He continues to have difficulty coordinating LLE when stepping to the left.   He then worked on [employer] for coordinated movement patterns and increasing reaction/ response  time with quickly grasping and releasing bean bags passed to him and pushing and pulling dowel bar as if he was doing a chest press. Cues needed to fully extend LUE.  With ball holds, he had much more difficulty controlling LUE.  Pt resting in his w/c with chair belt and quick release belt on.  All needs met.   Therapy Documentation Precautions:  Precautions Precautions: Fall Precaution Comments: pusher to the left, apraxia, decreased initiation, left hemiparesis Restrictions Weight Bearing Restrictions: No  Pain: Pain Assessment Pain Assessment: No/denies pain ADL:   See Function Navigator for Current Functional Status.   Therapy/Group: Individual Therapy  [place] 03/10/2017, 12:12 PM

## 2017-03-10 NOTE — Progress Notes (Signed)
[place] PHYSICAL MEDICINE & REHABILITATION     PROGRESS NOTE  Subjective/Complaints:  Sleeping awakens to physical stim  ROS- cannot obtain due to cognitive status Objective: Vital Signs: Blood pressure (!) 165/80, pulse 97, temperature 98.9 F (37.2 C), temperature source Oral, resp. rate 16, height 6\' 2"  (1.88 m), weight 103 kg (227 lb 0.7 oz), SpO2 100 %. No results found.  Recent Labs  03/09/17 0504  WBC 6.9  HGB 11.7*  HCT 35.2*  PLT 347    Recent Labs  03/09/17 0504  NA 139  K 3.8  CL 105  GLUCOSE 74  BUN 23*  CREATININE 1.17  CALCIUM 9.0   CBG (last 3)   Recent Labs  03/09/17 2044 03/10/17 0619 03/10/17 0653  GLUCAP 165* 66 90    Wt Readings from Last 3 Encounters:  03/02/17 103 kg (227 lb 0.7 oz)  02/08/17 103.7 kg (228 lb 11.2 oz)  02/08/17 124.3 kg (274 lb)    Physical Exam:  BP (!) 165/80   Pulse 97   Temp 98.9 F (37.2 C) (Oral)   Resp 16   Ht 6\' 2"  (1.88 m)   Wt 103 kg (227 lb 0.7 oz)   SpO2 100%   BMI 29.15 kg/m  Constitutional: He appears well-developedand well-nourished.  HENT: Normocephalicand atraumatic.  Eyes: EOMI. No discharge. Cardiovascular: RRR without murmur. No JVD . Respiratory: CTA Bilaterally without wheezes or rales. Normal effort .  GI: Soft. Bowel sounds are normal.   Neurological: He is alertand oriented.  Left facial weakness with left gaze preference and right neglect, stable.  Delayed processing with slow speech.  Lacks awareness/insight into deficits. Spastic left hemiparesis  Motor: Left lower extremity: 2/5 proximal to distal Moving LUE spontaneously only.  Skin: Skin is warmand dry.  Psychiatric: His affect is blunt. His speech is delayed. He is slowed. He expresses inappropriate judgment. He is inattentive.  Opens eyes briefly to command  Assessment/Plan: 1. 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 Physiatrist is providing close team supervision and 24 hour management of active medical problems listed below. Physiatrist and rehab team continue to assess barriers to discharge/monitor patient progress toward functional and medical goals.  Function:  Bathing Bathing position   Position: Shower  Bathing parts Body parts bathed by patient: Right arm, Left arm, Chest, Abdomen, Front perineal area, Right upper leg, Left upper leg, Right lower leg Body parts bathed by helper: Left lower leg, Buttocks  Bathing assist Assist Level: Touching or steadying assistance(Pt > 75%)      Upper Body Dressing/Undressing Upper body dressing   What is the patient wearing?: Pull over shirt/dress     Pull over shirt/dress - Perfomed by patient: Thread/unthread right sleeve, Put head through opening, Pull shirt over trunk, Thread/unthread left sleeve Pull over shirt/dress - Perfomed by helper: Thread/unthread left sleeve        Upper body assist Assist Level: Supervision or verbal cues, Set up   Set up : To obtain clothing/put away  Lower Body Dressing/Undressing Lower body dressing   What is the patient wearing?: Non-skid slipper socks, Pants   Underwear - Performed by helper: Thread/unthread right underwear leg, Thread/unthread left underwear leg, Pull underwear up/down Pants- Performed by patient: Thread/unthread right pants leg, Pull pants up/down Pants- Performed by helper: Thread/unthread left pants leg Non-skid slipper socks- Performed by patient: Don/doff right sock Non-skid slipper  socks- Performed by helper: Don/doff left sock       Shoes - Performed by helper: Don/doff right shoe, Don/doff left shoe, Fasten right, Fasten left          Lower body assist Assist for lower body dressing: Touching or steadying assistance (Pt > 75%)      Toileting Toileting Toileting activity did not occur: No continent bowel/bladder event    Toileting steps completed by helper: Adjust clothing prior to toileting, Adjust clothing after toileting, Performs perineal hygiene Toileting Assistive Devices: Grab bar or rail  Toileting assist Assist level: Touching or steadying assistance (Pt.75%)   Transfers Chair/bed transfer   Chair/bed transfer method: Stand pivot Chair/bed transfer assist level: Touching or steadying assistance (Pt > 75%) Chair/bed transfer assistive device: Mechanical lift Mechanical lift: Stedy   Locomotion Ambulation Ambulation activity did not occur: Safety/medical concerns   Max distance: 30 Assist level: Touching or steadying assistance (Pt > 75%)   Wheelchair Wheelchair activity did not occur: Safety/medical concerns Type: Motorized Max wheelchair distance: 50 Assist Level: Moderate assistance (Pt 50 - 74%)  Cognition Comprehension Comprehension assist level: Understands basic 90% of the time/cues < 10% of the time  Expression Expression assist level: Expresses basic 75 - 89% of the time/requires cueing 10 - 24% of the time. Needs helper to occlude trach/needs to repeat words.  Social Interaction Social Interaction assist level: Interacts appropriately 50 - 74% of the time - May be physically or verbally inappropriate.  Problem Solving Problem solving assist level: Solves basic 50 - 74% of the time/requires cueing 25 - 49% of the time  Memory Memory assist level: Recognizes or recalls 75 - 89% of the time/requires cueing 10 - 24% of the time    Medical Problem List and Plan: 1.  Left hemiparesis, hemisensory loss,  and cognitive deficits secondary to right ACA infarct, small right thalamic infarct on 7/24  Cont CIR,PT, OT, SLP severe apraxia, reduced initiation, severe left neglect and awareness of deficits limiting improvement,Plan D/C in am2.  DVT Prophylaxis/Anticoagulation: Pharmaceutical: Lovenox 3. H/o lumbago/Pain Management: Denies pain at this moment. Tylenol prn  -Knee pain Left hamstring  overnall improving, may try diclofenac gel 4. Mood: LCSW to follow for evaluation and support.  5. Neuropsych: This patient is not capable of making decisions on his  own behalf. Trial ritalin increased dose- no improvement will reduce to 5mg  Cont lexapro 6. Skin/Wound Care: routine pressure relief measures 7. Fluids/Electrolytes/Nutrition: Monitor I/Os--offer supplements if intake poor.   8.T2DM: monitor BS ac/hs. Continue lantus daily with meal coverage. Will use SSI for elevated BS.    CBG (last 3)   Recent Labs  03/09/17 2044 03/10/17 0619 03/10/17 0653  GLUCAP 165* 66 90   on lantus  30U, am CBG low will dial back to 25U   9. HTN:  Monitor BP bid  Norvasc 10mg    Changed Hytrin back to flomax again will need to increase other meds  Hydralazine 10 started 8/13, increased to 50 TID, some fluctuation Vitals:   03/10/17 0512 03/10/17 0623  BP: (!) 152/75 (!) 165/80  Pulse: 97   Resp: 16   Temp: 98.9 F (37.2 C)   SpO2: 100%    10 H/o urinary retention: currently has foley as per urology 11. Hyperlipidemia: On atorvastatin.  12. Question CKD: baseline SCr around 1.6 per records review. Continue to monitor.  Offer fluids between meals for adequate hydration.   Cr. 1.18 on 8/15     Cont to monitor 13.  Hyponatremia: Resolved   Sodium 139 on 8/15  Continue to monitor 14. Hypoalbuminemia  Supplement initiated 7/28 15. Acute blood loss anemia  Hemoglobin 10.6 on 8/15    Continue to monitor 16.  Spasticity- Left side , L hamstring most affected has daytime spasticity , Increased zanaflex to 2mg  BID and 4mg  Qhs,Botox 100 U to L hamstring on 8/5 17.  Urinary incont- occ retention now on Hytrin increase to 4mg  BID,   Previously failed flomax and place on hytrin to have a greater BP lowering effect   -cont foley  -urine with odor---Negative ua, ucs enteroccous probable contaminant, will cont short 5 d course amoxicillin 18.  Bowel incont cognitive factors , daily soft stools    -ensure held due to concern it was making stools loose  LOS (Days) 27 A FACE TO FACE EVALUATION WAS PERFORMED  KIRSTEINS,ANDREW E 03/10/2017 7:28 AM

## 2017-03-10 NOTE — Progress Notes (Signed)
Social Work Patient ID: Frank Moss, male   DOB: Jun 14, 1971, 46 y.o.   MRN: [id number]  Emailed wife to let her know when Frank Moss will be delivering the hospital bed and drop-arm bedside commode today between 9:45-1:45 pm. They have her cell phone number to contact. [facility] to deliver the loaner wheelchair tomorrow between 9:00-10:00 am. Home Health has been arranged via [facility] and will begin Sat with the RN evaluation. Had asked wife to come in For family training she was too busy with work and declined this. Have not Bussey from her, will hope all is being delivered today and early tomorrow.

## 2017-03-11 ENCOUNTER — Telehealth: Payer: Self-pay | Admitting: Physical Medicine & Rehabilitation

## 2017-03-11 LAB — GLUCOSE, CAPILLARY
GLUCOSE-CAPILLARY: 77 mg/dL (ref 65–99)
Glucose-Capillary: 92 mg/dL (ref 65–99)
Glucose-Capillary: 104 mg/dL — ABNORMAL HIGH (ref 65–99)

## 2017-03-11 MED ORDER — INSULIN GLARGINE 100 UNITS/ML SOLOSTAR PEN: 25.0000 [IU] | PEN_INJECTOR | Freq: Every day | SUBCUTANEOUS | 11 refills | Status: DC

## 2017-03-11 NOTE — Plan of Care (Signed)
Problem: RH BOWEL ELIMINATION Goal: RH STG MANAGE BOWEL WITH ASSISTANCE STG Manage Bowel with Moderate Assistance to [facility] or bathroom.    Outcome: Not Progressing Pt continues to be incontinent of bowel.

## 2017-03-11 NOTE — Progress Notes (Signed)
Social Work Patient ID: Frank Moss, male   DOB: 07/28/1970, 46 y.o.   MRN: [id number] Supposely [facility] has declined referral and now [facility] is looking for another agency to staff the referral. They will need to begin services on Sat. [facility] has accepted the case and will start tomorrow. Will email wife to let her know of the change.

## 2017-03-11 NOTE — Telephone Encounter (Signed)
Contacted pharmacy, they said pt needs prior authorization for Lantus. Faxed over to rehab PA's office at [facility]. They are more versed with diabetic medication

## 2017-03-11 NOTE — Progress Notes (Signed)
Social Work  Discharge Note  The overall goal for the admission was met for:   Discharge location: [place] WITH PRIVATE DUTY CARE  Length of Stay: Yes-28 DAYS  Discharge activity level: Yes-MIN-MOD WHEELCHAIR LEVEL  Home/community participation: Yes  Services provided included: MD, RD, PT, OT, SLP, RN, CM, Pharmacy, Neuropsych and SW  Financial Services: Private Insurance: [employer]  Follow-up services arranged: Home Health: [facility]-PT,OT,SP,RN, DME: [facility]-HOSPITAL BED, DROP-ARM BEDSIDE [facility]-WHEELCHAIR and Patient/Family has no preference for HH/DME agencies [facility]-has declined referral and now [facility] has made referral to [facility] for the above services. Will start care tomorrow-wife is aware of this change  Comments (or additional information):DAUGHTER WAS IN TO GO THROUGH THERAPIES WITH PT AND LEARN HIS CARE. SHE DID REFUSE A COMMODE TRANSFER THOUGH. WIFE DECLINED TO COME IN AND LEARN HIS CARE. WAS GIVEN RESOURCES TO HIRE PRIVATE DUTY CAREGIVERS AND HOPEFULLY DID DO THIS. AWARE HE WILL REQUIRE 24 HR CARE AT HOME. PT IS AT HIGH RISK FOR RE-ADMISSION DUE TO LACK OF FAMILY EDUCATION PRIOR TO DISCHARGE AND HIS IMPULSIVITY MAKING HIM HIGH RISK TO FALL AT HOME.   Patient/Family verbalized understanding of follow-up arrangements: Yes  Individual responsible for coordination of the follow-up plan: [place]  Confirmed correct DME delivered: Elease Hashimoto 03/11/2017    Elease Hashimoto

## 2017-03-11 NOTE — Telephone Encounter (Signed)
[facility] pharmacy has questions about patient's medication.  Please call them at [phone].

## 2017-03-11 NOTE — Progress Notes (Signed)
[place] PHYSICAL MEDICINE & REHABILITATION     PROGRESS NOTE  Subjective/Complaints:   Pt unaware of d/c today  ROS- cannot obtain due to cognitive status Objective: Vital Signs: Blood pressure (!) 151/73, pulse 91, temperature 98.7 F (37.1 C), temperature source Oral, resp. rate 18, height 6\' 2"  (1.88 m), weight 103 kg (227 lb 0.7 oz), SpO2 99 %. No results found.  Recent Labs  03/09/17 0504  WBC 6.9  HGB 11.7*  HCT 35.2*  PLT 347    Recent Labs  03/09/17 0504  NA 139  K 3.8  CL 105  GLUCOSE 74  BUN 23*  CREATININE 1.17  CALCIUM 9.0   CBG (last 3)   Recent Labs  03/10/17 1136 03/10/17 1631 03/10/17 2050  GLUCAP 100* 143* 142*    Wt Readings from Last 3 Encounters:  03/02/17 103 kg (227 lb 0.7 oz)  02/08/17 103.7 kg (228 lb 11.2 oz)  02/08/17 124.3 kg (274 lb)    Physical Exam:  BP (!) 151/73 (BP Location: Right Arm)   Pulse 91   Temp 98.7 F (37.1 C) (Oral)   Resp 18   Ht 6\' 2"  (1.88 m)   Wt 103 kg (227 lb 0.7 oz)   SpO2 99%   BMI 29.15 kg/m  Constitutional: He appears well-developedand well-nourished.  HENT: Normocephalicand atraumatic.  Eyes: EOMI. No discharge. Cardiovascular: RRR without murmur. No JVD . Respiratory: CTA Bilaterally without wheezes or rales. Normal effort .  GI: Soft. Bowel sounds are normal.   Neurological: He is alertand oriented.  Left facial weakness with left gaze preference and right neglect, stable.  Delayed processing with slow speech.  Lacks awareness/insight into deficits. Spastic left hemiparesis  Motor: Left lower extremity: 2/5 proximal to distal Moving LUE spontaneously only.  Skin: Skin is warmand dry.  Psychiatric: His affect is blunt. His speech is delayed. He is slowed. He expresses inappropriate judgment. He is inattentive.  Opens eyes briefly to command  Assessment/Plan: 1. 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, small right thalamic infarct  Stable for D/C today F/u PCP in  3-4 weeks F/u PM&R 2 weeks See D/C summary See D/C instructions Function:  Bathing Bathing position   Position: Wheelchair/chair at sink  Bathing parts Body parts bathed by patient: Right arm, Left arm, Chest, Abdomen, Front perineal area, Right upper leg, Left upper leg, Right lower leg Body parts bathed by helper: Left lower leg, Buttocks, Back  Bathing assist Assist Level: Touching or steadying assistance(Pt > 75%)      Upper Body Dressing/Undressing Upper body dressing   What is the patient wearing?: Pull over shirt/dress     Pull over shirt/dress - Perfomed by patient: Thread/unthread right sleeve, Put head through opening, Pull shirt over trunk, Thread/unthread left sleeve Pull over shirt/dress - Perfomed by helper: Thread/unthread left sleeve        Upper body assist Assist Level: Supervision or verbal cues, Set up   Set up : To obtain clothing/put away  Lower Body Dressing/Undressing Lower body dressing   What is the patient wearing?: Non-skid slipper socks, Pants, Shoes   Underwear - Performed by helper: Thread/unthread right underwear leg, Thread/unthread left underwear leg, Pull underwear up/down Pants- Performed by patient: Thread/unthread right pants leg, Pull pants up/down Pants- Performed by helper: Thread/unthread left pants leg Non-skid slipper socks- Performed by patient: Don/doff right sock Non-skid slipper socks- Performed by helper: Don/doff left sock     Shoes - Performed by patient: Don/doff right shoe Shoes -  Performed by helper: Don/doff left shoe, Fasten right, Fasten left          Lower body assist Assist for lower body dressing: Touching or steadying assistance (Pt > 75%)      Toileting Toileting Toileting activity did not occur: No continent bowel/bladder event Toileting steps completed by patient: Adjust clothing prior to toileting, Adjust clothing after toileting Toileting steps completed by helper: Performs perineal hygiene Toileting  Assistive Devices: Grab bar or rail  Toileting assist Assist level: Touching or steadying assistance (Pt.75%)   Transfers Chair/bed transfer   Chair/bed transfer method: Stand pivot Chair/bed transfer assist level: Touching or steadying assistance (Pt > 75%) Chair/bed transfer assistive device: Armrests Mechanical lift: Stedy   Locomotion Ambulation Ambulation activity did not occur: Safety/medical concerns   Max distance: 30 Assist level: Touching or steadying assistance (Pt > 75%)   Wheelchair Wheelchair activity did not occur: Safety/medical concerns Type: Manual Max wheelchair distance: 50 Assist Level: Touching or steadying assistance (Pt > 75%)  Cognition Comprehension Comprehension assist level: Understands basic 90% of the time/cues < 10% of the time  Expression Expression assist level: Expresses basic 75 - 89% of the time/requires cueing 10 - 24% of the time. Needs helper to occlude trach/needs to repeat words.  Social Interaction Social Interaction assist level: Interacts appropriately 50 - 74% of the time - May be physically or verbally inappropriate.  Problem Solving Problem solving assist level: Solves basic 50 - 74% of the time/requires cueing 25 - 49% of the time  Memory Memory assist level: Recognizes or recalls 50 - 74% of the time/requires cueing 25 - 49% of the time    Medical Problem List and Plan: 1.  Left hemiparesis, hemisensory loss,  and cognitive deficits secondary to right ACA infarct, small right thalamic infarct on 7/24  D/c today    2.  DVT Prophylaxis/Anticoagulation: Pharmaceutical: Lovenox 3. H/o lumbago/Pain Management: Denies pain at this moment. Tylenol prn  -Knee pain Left hamstring overnall improving, may try diclofenac gel 4. Mood: LCSW to follow for evaluation and support.  5. Neuropsych: This patient is not capable of making decisions on his  own behalf. Trial ritalin increased dose- no improvement will reduce to 5mg  Cont lexapro 6.  Skin/Wound Care: routine pressure relief measures 7. Fluids/Electrolytes/Nutrition: Monitor I/Os--offer supplements if intake poor.   8.T2DM: monitor BS ac/hs. Continue lantus daily with meal coverage. Will use SSI for elevated BS.    CBG (last 3)   Recent Labs  03/10/17 1136 03/10/17 1631 03/10/17 2050  GLUCAP 100* 143* 142*   on lantus  30U, am CBG low will dial back to 25U, await am CBG   9. HTN:  Monitor BP bid  Norvasc 10mg    Changed Hytrin back to flomax again will need to increase other meds  Hydralazine 10 started 8/13, increased to 50 TID, some fluctuation Vitals:   03/10/17 1328 03/11/17 0511  BP: (!) 158/69 (!) 151/73  Pulse: 98 91  Resp: 20 18  Temp: 98.9 F (37.2 C) 98.7 F (37.1 C)  SpO2: 98% 99%   10 H/o urinary retention: currently has foley as per urology 11. Hyperlipidemia: On atorvastatin.  12. Question CKD: baseline SCr around 1.6 per records review. Continue to monitor.  Offer fluids between meals for adequate hydration.   Cr. 1.18 on 8/15     Cont to monitor 13. Hyponatremia: Resolved   Sodium 139 on 8/15  Continue to monitor 14. Hypoalbuminemia  Supplement initiated 7/28 15. Acute blood loss anemia  Hemoglobin 10.6 on 8/15    Continue to monitor 16.  Spasticity- Left side , L hamstring most affected has daytime spasticity , Increased zanaflex to 2mg  BID and 4mg  Qhs,Botox 100 U to L hamstring on 8/5 17.  Urinary incont- occ retention now on Hytrin increase to 4mg  BID,   Previously failed flomax and place on hytrin to have a greater BP lowering effect   -cont foley  -urine with odor---Negative ua, ucs enteroccous probable contaminant, will cont short 5 d course amoxicillin 18.  Bowel incont cognitive factors , daily soft stools   -ensure held due to concern it was making stools loose  LOS (Days) 28 A FACE TO FACE EVALUATION WAS PERFORMED  KIRSTEINS,ANDREW E 03/11/2017 6:03 AM

## 2017-03-11 NOTE — Discharge Instructions (Signed)
Inpatient Rehab Discharge Instructions  Frank Moss Discharge date and time:    Activities/Precautions/ Functional Status: Activity: activity as tolerated Diet: cardiac diet and diabetic diet Wound Care: none needed   Functional status:  ___ No restrictions     ___ Walk up steps independently ___ 24/7 supervision/assistance   ___ Walk up steps with assistance ___ Intermittent supervision/assistance  ___ Bathe/dress independently ___ Walk with walker     _X__ Bathe/dress with assistance ___ Walk Independently    ___ Shower independently ___ Walk with assistance    ___ Shower with assistance _X__ No alcohol     ___ Return to work/school ________   Special Instructions:    COMMUNITY REFERRALS UPON DISCHARGE:    Home Health:   PT, OT, SP, RN  Agency:[facility] Phone:[phone]   Date of last service:03/11/2017  Medical Equipment/Items Ordered:WHEELCHAIR-[facility], HOSPITAL BED, DROP-ARM BEDSIDE [facility]  Agency/Supplier:[phone]  [facility]-[phone] Other:WIFE TO APPLY FOR SSD FOR PT ALREADY RECEIVING SHORT TERM DISABILITY THROUGH WORK  GENERAL COMMUNITY RESOURCES FOR PATIENT/FAMILY: Support Groups:[facility] SUPPORT GROUP EVERY SECOND Thursday @ 3:00-4:00 PM ON THE REHAB UNIT QUESTIONS CONTACT [name] [phone]  STROKE/TIA DISCHARGE INSTRUCTIONS SMOKING Cigarette smoking nearly doubles your risk of having a stroke & is the single most alterable risk factor  If you smoke or have smoked in the last 12 months, you are advised to quit smoking for your health.  Most of the excess cardiovascular risk related to smoking disappears within a year of stopping.  Ask you doctor about anti-smoking medications  [place] Quit Line: 1-800-QUIT NOW  Free Smoking Cessation Classes [phone]  CHOLESTEROL Know your levels; limit fat & cholesterol in your diet  Lipid Panel     Component Value Date/Time   CHOL 138 02/09/2017 0653   TRIG 128 02/09/2017  0653   HDL 39 (L) 02/09/2017 0653   CHOLHDL 3.5 02/09/2017 0653   VLDL 26 02/09/2017 0653   LDLCALC 73 02/09/2017 0653      Many patients benefit from treatment even if their cholesterol is at goal.  Goal: Total Cholesterol (CHOL) less than 160  Goal:  Triglycerides (TRIG) less than 150  Goal:  HDL greater than 40  Goal:  LDL (LDLCALC) less than 100   BLOOD PRESSURE American Stroke Association blood pressure target is less that 120/80 mm/Hg  Your discharge blood pressure is:  BP: (!) 168/82  Monitor your blood pressure  Limit your salt and alcohol intake  Many individuals will require more than one medication for high blood pressure  DIABETES (A1c is a blood sugar average for last 3 months) Goal HGBA1c is under 7% (HBGA1c is blood sugar average for last 3 months)  Diabetes:     Lab Results  Component Value Date   HGBA1C 11.2 01/21/2017     Your HGBA1c can be lowered with medications, healthy diet, and exercise.  Check your blood sugar as directed by your physician  Call your physician if you experience unexplained or low blood sugars.  PHYSICAL ACTIVITY/REHABILITATION Goal is 30 minutes at least 4 days per week  Activity: No driving, Therapies: see above Return to work: N/A  Activity decreases your risk of heart attack and stroke and makes your heart stronger.  It helps control your weight and blood pressure; helps you relax and can improve your mood.  Participate in a regular exercise program.  Talk with your doctor about the best form of exercise for you (dancing, walking, swimming, cycling).  DIET/WEIGHT Goal is to  maintain a healthy weight  Your discharge diet is: Diet heart healthy/carb modified Room service appropriate? Yes; Fluid consistency: Thin  liquids Your height is:  Height: 6\' 2"  (188 cm) Your current weight is: Weight: 103 kg (227 lb 0.7 oz) Your Body Mass Index (BMI) is:  BMI (Calculated): 29.7  Following the type of diet specifically designed for  you will help prevent another stroke.  Your goal weight is:  194 lbs  Your goal Body Mass Index (BMI) is 19-24.  Healthy food habits can help reduce 3 risk factors for stroke:  High cholesterol, hypertension, and excess weight.  RESOURCES Stroke/Support Group:  Call [phone]   STROKE EDUCATION PROVIDED/REVIEWED AND GIVEN TO PATIENT Stroke warning signs and symptoms How to activate emergency medical system (call 911). Medications prescribed at discharge. Need for follow-up after discharge. Personal risk factors for stroke. Pneumonia vaccine given:  Flu vaccine given:  My questions have been answered, the writing is legible, and I understand these instructions.  I will adhere to these goals & educational materials that have been provided to me after my discharge from the hospital.     My questions have been answered and I understand these instructions. I will adhere to these goals and the provided educational materials after my discharge from the hospital.  Patient/Caregiver Signature _______________________________ Date __________  Clinician Signature _______________________________________ Date __________  Please bring this form and your medication list with you to all your follow-up doctor's appointments.

## 2017-03-15 NOTE — Discharge Summary (Signed)
Physician Discharge Summary  Patient ID: Frank Moss MRN: [id number] DOB/AGE: [birth year] 46 y.o.  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  Labs:  Basic Metabolic Panel: BMP Latest Ref Rng & Units 03/09/2017 03/02/2017 02/23/2017  Glucose 65 - 99 mg/dL 74 89 753(Y)  BUN 6 - 20 mg/dL 05(R) 10(Y) 11(Z)  Creatinine 0.61 - 1.24 mg/dL 7.35 6.70 1.41(C)  Sodium 135 - 145 mmol/L 139 139 137  Potassium 3.5 - 5.1 mmol/L 3.8 4.0 4.0  Chloride 101 - 111 mmol/L 105 102 102  CO2 22 - 32 mmol/L 27 30 28   Calcium 8.9 - 10.3 mg/dL 9.0 9.0 3.0(D)    CBC: CBC Latest Ref Rng & Units 03/09/2017 03/02/2017 02/23/2017  WBC 4.0 - 10.5 K/uL 6.9 4.8 4.9  Hemoglobin 13.0 - 17.0 g/dL 11.7(L) 10.6(L) 11.0(L)  Hematocrit 39.0 - 52.0 % 35.2(L) 31.6(L) 32.7(L)  Platelets 150 - 400 K/uL 347 261 257    CBG:  Recent Labs Lab 03/10/17 1631 03/10/17 2050 03/11/17 0635 03/11/17 1151 03/11/17 1634  GLUCAP 143* 142* 77 92 104*    Brief HPI:   Frank Moss is a 46 year old right handed male with history of T2DM with retinopathy, HTN, hyperlipidemia and medication noncompliance who had a right ACA stroke on 01/20/2017. He was admitted to [facility] and started on aspirin and Plavix for his stroke prophylaxis. Hemoglobin A1c was 11.2, LDL- 65, and triglycerides- 298. During that hospitalization, he had evidence of  depression as well as urinary retention and a Foley catheter was placed. He was discharged to a skilled nursing facility for rehab. On 02/08/2017, he had worsening of swallow function and was sent to [facility] ED. MRI brain done showing  small acute infarct in the right anterior thalamus and a subacute right ACA infarct. TEE done  and revealed normal LVF, no shunt, no thrombus or vegetation and trivial pericardial effusion. Stroke felt to be embolic due to severe A2 and P2 segment atheromatous stenosis, advanced SVD and poorly controlled diabetes. Patient with cognitive deficits with delayed processing, left spastic hemiparesis with deficits in mobility and self care tasks. CIR recommended by MD and rehab team.    Hospital Course: Frank Moss was admitted to rehab 02/11/2017 for inpatient therapies to consist of PT, ST and OT at least three hours five days a week. Past admission physiatrist, therapy team and rehab RN have worked together to provide customized collaborative inpatient rehab. He was maintained on ASA/plavix for secondary stroke prevention. Po intake has been good and diabetes was monitored with ac/hs CBG checks. He continued to have significant cognitive deficits with delayed processing and ritalin was added to help with attention and activation.  Higher doses did not show improvement therefore this was decreased to 5 mg at discharge. His blood pressure were monitored on bid basis and were noted to be labile. Norvasc was titrated upward and hydralazine was added to help with better  BP control.   His renal status has been monitored with serial check and has shown improvement. He was noted to be incontinent of bowel and bladder. Post void checks revealed incontinence due to overflow and urecholine was added and titrated upwards in addition to scheduled toileting to help with bladder function.  He has been treated for Staph and recurrent enterococcus UTI with 5 day course of amoxicillin. GU was consulted  for input and recommended indwelling foley as family was not comfortable with performing I/O caths. Urecholine was discontinued and Flomax was added with recommendations to follow up in office in a month.   Diabetes has been monitored with ac/hs CBG checks and Lantus has been adjusted to prevent hypoglycemic episodes. Protein supplement was added to help with low protein stores. Tizanidine was added to help with significant flexor tone which was affecting weight bearing, mobility and left knee pain.  This has been titrated upwards and left hamstring injected with 100U Botox on 8/5 by Dr. Wynn Banker. He continues to be limited by left spastic hemiparesis, cognitive deficits, right neglect, severe apraxia and left pusher syndrome. He has progressed to min to mod assist for mobility depending on fatigue and attention. He will continue to receive follow up HHPT, HHOT,HHST and HHRN by [facility] after discharge.    Rehab course: During patient's stay in rehab weekly team conferences were held to monitor patient's progress, set goals and discuss barriers to discharge. At admission, patient required total assistance with mobility and basic self care tasks. He exhibited moderate to severe cognitive impairments with MoCA score 18/30. He has had improvement in activity tolerance, balance, postural control, as well as ability to compensate for deficits. .  He requires min assist with max multimodal cues to propel his wheelchair. He requires min assist for transfers and verbal and tactile cues. He is able to ambulate 30 feet with use of rail, min assist and tactile cues for posture.  He requires mod to max verbal cues for problem solving, recall and attention to complete functional and familiar tasks. Repeat MoCA score at 18/30 and he continues to demonstrate deficits in attention, short-term recall and executive functioning. Attempts were made to schedule family education with wife but she has declined this.  Daughter has completed some education and family plans on hiring assistance after discharge.     Disposition: 01-Home or Self Care  Diet: Diabetic/Heart healthy.   Special Instructions: 1. Needs 24 hours supervision.  Discharge Instructions    Ambulatory referral to Physical Medicine Rehab    Complete by:  As directed    1-2 weeks transitional care appt     Allergies as of 03/11/2017      Reactions   Beta Adrenergic Blockers Other (See Comments)   "Fatigue," but this isn't noted on the [facility]'s [facility]      Medication List    STOP taking these medications   bisacodyl 10 MG suppository Commonly known as:  DULCOLAX   diltiazem 90 MG tablet Commonly known as:  CARDIZEM   insulin aspart 100 UNIT/ML injection Commonly known as:  novoLOG   LANTUS 100 UNIT/ML injection Generic drug:  insulin glargine Replaced by:  insulin glargine 100 unit/mL Sopn   magnesium hydroxide 400 MG/5ML suspension Commonly known as:  MILK OF MAGNESIA   ondansetron 4 MG tablet Commonly known as:  ZOFRAN   RA SALINE ENEMA 19-7 GM/118ML Enem     TAKE these medications   amLODipine 10 MG tablet Commonly known as:  NORVASC  Take 1 tablet (10 mg total) by mouth daily.   amoxicillin 250 MG capsule Commonly known as:  AMOXIL Take 1 capsule (250 mg total) by mouth every 8 (eight) hours.   aspirin 325 MG tablet Take 325 mg by mouth daily.   atorvastatin 80 MG tablet Commonly known as:  LIPITOR Take 1 tablet (80 mg total) by mouth at bedtime.   clopidogrel 75 MG tablet Commonly known as:  PLAVIX Take 1 tablet (75 mg total) by mouth daily.   escitalopram 10 MG tablet Commonly known as:  LEXAPRO Take 1 tablet (10 mg total) by mouth daily.   hydrALAZINE 50 MG tablet Commonly known as:  APRESOLINE Take 1 tablet (50 mg total) by mouth every 8 (eight) hours.   insulin glargine 100 unit/mL Sopn Commonly known as:  LANTUS Inject 0.25 mLs (25 Units total) into the skin at bedtime. Replaces:   LANTUS 100 UNIT/ML injection   methylphenidate 5 MG tablet Commonly known as:  RITALIN Take 1 tablet (5 mg total) by mouth 2 (two) times daily with breakfast and lunch.   nitroGLYCERIN 0.4 MG SL tablet Commonly known as:  NITROSTAT Place 0.4 mg under the tongue every 5 (five) minutes x 3 doses as needed for chest pain.   tamsulosin 0.4 MG Caps capsule Commonly known as:  FLOMAX Take 1 capsule (0.4 mg total) by mouth at bedtime.   tiZANidine 2 MG tablet Commonly known as:  ZANAFLEX Take 1 tablet (2 mg total) by mouth 2 (two) times daily with breakfast and lunch.   tiZANidine 4 MG tablet Commonly known as:  ZANAFLEX Take 1 tablet (4 mg total) by mouth at bedtime.          Follow-up Information    Kirsteins, Victorino Sparrow, MD Follow up.   Specialty:  Physical Medicine and Rehabilitation Why:  office will call you with follow up  appointment Contact information: [address] [place] [address] [phone]        Micki Riley, MD. Call in 1 day(s).   Specialties:  Neurology, Radiology Why:  for follow up appointment in 4 weeks.  Contact information: [address] [place] [address] [phone]        Hillis Range, MD. Call in 1 day(s).   Specialty:  Cardiology Why:  to discuss placement of 30 day event monitor Contact information: [address] [place] [address] [phone]        Darrow Bussing, MD Follow up on 03/16/2017.   Specialty:  Family Medicine Why:  Appointment @ 3:45 PM Contact information: [address] [place] [address] [phone]        Hershal Coria, PsyD Follow up.   Specialty:  Psychology Why:  Schedule for neuropsych consult Contact information: [address] [place] [address] [phone]           Signed: Jacquelynn Cree 03/15/2017, 3:48 PM

## 2017-03-16 ENCOUNTER — Telehealth: Payer: Self-pay | Admitting: *Deleted

## 2017-03-16 NOTE — Telephone Encounter (Signed)
[name] ST [facility] to get verbal orders for [name] to work on language and memory.  Approval given.

## 2017-03-22 ENCOUNTER — Telehealth: Payer: Self-pay | Admitting: Physical Medicine & Rehabilitation

## 2017-03-22 NOTE — Telephone Encounter (Signed)
Patient has left the hospital, we'll need to do this on follow-up visit, he can see Riley LamEunice for wheelchair visit sooner

## 2017-03-22 NOTE — Telephone Encounter (Signed)
Zenia ResidesDebbie Roach needs the following information on the discharge paperwork for wheelchair  We need the F2F for inpatient with tentative discharge date tomorrow. F2F can be any date after PT eval date of 03/08/17. >>  >> F2F needs to include the following: >>  >> 1-MD is seeing patient for F2F for Tilt in Space Manual Wheelchair. >> 2-MD has reviewed and concurs with the PT eval. >> 3-Patient needs Tilt in space due to inability to pressure relieve. >>  >> Please let me know if you have any questions. Thanks so much!

## 2017-03-23 ENCOUNTER — Telehealth: Payer: Self-pay | Admitting: Physical Medicine & Rehabilitation

## 2017-03-23 NOTE — Telephone Encounter (Signed)
Please get the info I need to include with my note copied and place in the red folder for my visit

## 2017-03-23 NOTE — Telephone Encounter (Signed)
I spoke with wife and she just wants to have the wheelchair eval at the time of his visit.  It is hard to get him to appointments.

## 2017-03-28 ENCOUNTER — Ambulatory Visit (HOSPITAL_BASED_OUTPATIENT_CLINIC_OR_DEPARTMENT_OTHER): Payer: 59 | Admitting: Physical Medicine & Rehabilitation

## 2017-03-28 ENCOUNTER — Telehealth: Payer: Self-pay | Admitting: *Deleted

## 2017-03-28 ENCOUNTER — Encounter: Payer: Self-pay | Admitting: Physical Medicine & Rehabilitation

## 2017-03-28 ENCOUNTER — Encounter: Payer: 59 | Attending: Physical Medicine & Rehabilitation

## 2017-03-28 VITALS — BP 162/91 | HR 95 | Resp 14

## 2017-03-28 DIAGNOSIS — F329 Major depressive disorder, single episode, unspecified: Secondary | ICD-10-CM | POA: Diagnosis not present

## 2017-03-28 DIAGNOSIS — I6930 Unspecified sequelae of cerebral infarction: Secondary | ICD-10-CM | POA: Diagnosis not present

## 2017-03-28 DIAGNOSIS — G8114 Spastic hemiplegia affecting left nondominant side: Secondary | ICD-10-CM | POA: Diagnosis not present

## 2017-03-28 DIAGNOSIS — I679 Cerebrovascular disease, unspecified: Secondary | ICD-10-CM | POA: Diagnosis not present

## 2017-03-28 DIAGNOSIS — I10 Essential (primary) hypertension: Secondary | ICD-10-CM | POA: Diagnosis not present

## 2017-03-28 DIAGNOSIS — I69319 Unspecified symptoms and signs involving cognitive functions following cerebral infarction: Secondary | ICD-10-CM | POA: Diagnosis present

## 2017-03-28 DIAGNOSIS — R339 Retention of urine, unspecified: Secondary | ICD-10-CM | POA: Insufficient documentation

## 2017-03-28 DIAGNOSIS — E1165 Type 2 diabetes mellitus with hyperglycemia: Secondary | ICD-10-CM | POA: Diagnosis not present

## 2017-03-28 DIAGNOSIS — Z9114 Patient's other noncompliance with medication regimen: Secondary | ICD-10-CM | POA: Insufficient documentation

## 2017-03-28 DIAGNOSIS — I2510 Atherosclerotic heart disease of native coronary artery without angina pectoris: Secondary | ICD-10-CM | POA: Insufficient documentation

## 2017-03-28 DIAGNOSIS — E11319 Type 2 diabetes mellitus with unspecified diabetic retinopathy without macular edema: Secondary | ICD-10-CM | POA: Diagnosis not present

## 2017-03-28 DIAGNOSIS — E785 Hyperlipidemia, unspecified: Secondary | ICD-10-CM | POA: Insufficient documentation

## 2017-03-28 MED ORDER — TIZANIDINE HCL 4 MG PO TABS: 4.0000 mg | ORAL_TABLET | Freq: Three times a day (TID) | ORAL | 1 refills | Status: DC

## 2017-03-28 MED ORDER — TIZANIDINE HCL 4 MG PO TABS: 4.0000 mg | ORAL_TABLET | Freq: Every day | ORAL | 0 refills | Status: DC

## 2017-03-28 NOTE — Progress Notes (Signed)
Subjective:    Patient ID: Frank Moss, male    DOB: [birth year], 46 y.o.   MRN: [id number] 46 year old right handed male with history of T2DM with retinopathy, HTN,  hyperlipidemia and medication noncompliance who had a right ACA stroke on 01/20/2017. He was admitted to [facility] and started on aspirin and Plavix for his stroke prophylaxis. Hemoglobin A1c was 11.2, LDL- 65, and triglycerides-  298. During that hospitalization, he had evidence of depression as well as urinary retention and a Foley catheter was placed. He was discharged to a skilled nursing facility for rehab. On 02/08/2017, he had worsening of swallow function and was sent to [facility] ED. MRI brain done showing  small acute infarct in the right anterior thalamus and a subacute right ACA infarct. TEE done  and revealed normal LVF, no shunt, no thrombus or vegetation and trivial pericardial effusion. Stroke felt to be embolic due to severe A2 and P2 segment atheromatous stenosis, advanced SVD and poorly controlled diabetes. Patient with cognitive deficits with delayed processing, left spastic hemiparesis with deficits in mobility and self care tasks. CIR recommended by MD and rehab team.    Admit date: 02/11/2017 Discharge date: 03/11/2017  HPI Saw PCP 03/16/2017 Urology appt next week  Here for physical medicine and rehabilitation visit. Mobility examination  Reviewed and agree with PT evaluation  Patient is unable to dress or bathe himself, unable to ambulate due to left hemiparesis from his anterior cerebral artery stroke.  Wheelchair bound, unable to ambulate with a cane or a walker  His cognition is impaired with a reduced score on the  Mini-Mental Status exam, which precludes him from operating an electric wheelchair or an electric scooter He is unable  to propel himself using his right arm and right leg due to severe attentional deficits related to his stroke  Pain Inventory Average Pain 5 Pain Right Now 1 My pain is  aching  In the last 24 hours, has pain interfered with the following? General activity 8 Relation with others 8 Enjoyment of life 9 What TIME of day is your pain at its worst? daytime Sleep (in general) Fair  Pain is worse with: walking, bending and standing Pain improves with: rest and medication Relief from Meds: 4  Mobility how many minutes can you walk? 0 ability to climb steps?  no do you drive?  no use a wheelchair needs help with transfers Do you have any goals in this area?  yes  Function disabled: date disabled . I need assistance with the following:  feeding, dressing, bathing, toileting, meal prep, household duties and shopping Do you have any goals in this area?  yes  Neuro/Psych bladder control problems bowel control problems weakness trouble walking confusion depression anxiety suicidal thoughts  Prior Studies hospital f/u  Physicians involved in your care hospital f/u   Family History  Problem Relation Age of Onset  . Hypertension Mother   . Depression Mother   . Cancer Neg Hx   . Diabetes Neg Hx   . Stroke Neg Hx    Social History   Social History  . Marital status: Married    Spouse name: N/A  . Number of children: N/A  . Years of education: N/A   Social History Main Topics  . Smoking status: Never Smoker  . Smokeless tobacco: Never Used  . Alcohol use No  . Drug use: No  . Sexual activity: Not Asked   Other Topics Concern  . None   Social History  Narrative   Has a BS in business administration and an MBA.  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   Past Surgical History:  Procedure Laterality Date  . TEE WITHOUT CARDIOVERSION N/A 02/11/2017   Procedure: TRANSESOPHAGEAL ECHOCARDIOGRAM (TEE);  Surgeon: Quintella Reichert, MD;  Location: [facility] OR;  Service: Cardiovascular;  Laterality: N/A;  . TEE WITHOUT CARDIOVERSION N/A 02/11/2017   Procedure: TRANSESOPHAGEAL ECHOCARDIOGRAM (TEE);  Surgeon: Quintella Reichert, MD;   Location: [facility] ENDOSCOPY;  Service: Cardiovascular;  Laterality: N/A;   Past Medical History:  Diagnosis Date  . Abnormal ECG   . Acute ischemic right ACA stroke (HCC)   . Coronary artery disease, occlusive   . Dysfunction of eustachian tube   . History of right ACA stroke   . Hyperlipidemia associated with type 2 diabetes mellitus (HCC)   . Hypertension   . Impotence of organic origin   . Lumbago   . Lymphadenitis    Unspecified, except mesenteric  . Type 2 diabetes, uncontrolled, with retinopathy (HCC)   . Urinary retention 01/2017   BP (!) 162/91 (BP Location: Right Arm, Patient Position: Sitting, Cuff Size: Normal)   Pulse 95   Resp 14   SpO2 94%   Opioid Risk Score:   Fall Risk Score:  `1  Depression screen PHQ 2/9  Depression screen PHQ 2/9 03/28/2017  Decreased Interest 3  Down, Depressed, Hopeless 3  PHQ - 2 Score 6  Altered sleeping 3  Tired, decreased energy 3  Change in appetite 1  Feeling bad or failure about yourself  3  Trouble concentrating 3  Moving slowly or fidgety/restless 2  Suicidal thoughts 3  PHQ-9 Score 24  Difficult doing work/chores Extremely dIfficult    Review of Systems  Constitutional: Positive for unexpected weight change.  HENT: Negative.   Eyes: Negative.   Respiratory: Positive for apnea.   Cardiovascular: Negative.   Gastrointestinal: Positive for diarrhea.  Endocrine: Negative.   Genitourinary: Positive for difficulty urinating and dysuria.  Musculoskeletal: Positive for gait problem.  Skin: Negative.   Allergic/Immunologic: Negative.   Neurological: Positive for weakness.  Hematological: Negative.   Psychiatric/Behavioral: Positive for confusion, dysphoric mood and suicidal ideas. The patient is nervous/anxious.        Objective:   Physical Exam  Constitutional: He appears well-developed and well-nourished.  HENT:  Head: Normocephalic and atraumatic.  Cardiovascular: Normal rate, regular rhythm and normal heart  sounds.   Pulmonary/Chest: Effort normal and breath sounds normal. No respiratory distress.  Abdominal: Soft. Bowel sounds are normal. He exhibits no distension.  Genitourinary:  Genitourinary Comments: Drop of bright red blood. At urethral meatus  Psychiatric: His affect is blunt. His speech is delayed. He is slowed and withdrawn. Cognition and memory are impaired. He expresses no suicidal ideation. He expresses no suicidal plans and no homicidal plans. He exhibits abnormal recent memory and abnormal remote memory.  Has had suicidal ideation since discharge, none currently He is inattentive.  Nursing note and vitals reviewed.  MMSE 21 Height and weight deferred today, unable to stand, recent hospitalization. Height 6 foot 2, weight 227 pounds  Motor strength is 5/5 in the right deltoid, bicep, tricep, grip, hip flexion, knee extension, ankle dorsiflexion, plantar flexion. 4 minus in the left deltoid, biceps, triceps, grip, 0 at the left hip flexor, knee extensor, ankle dorsiflexor  Tone modified [place] 2. Left hamstring         Assessment & Plan:  1. Left spastic hemi-(lower extremity greater than upper  extremity due to right ACA infarct  Recommending hemi-tilt in space manual wheelchair, length of need is lifetime  Patient is unable to do pressure relief maneuvers due to cognitive and motor planning deficits.  He will be working with home health PT, OT, speech.   [facility]  Follow-up physical medicine rehabilitation 1 month  We discussed timeframe of recovery and that his deficits are going to be long-term  Neuropsych follow up for adjustment to deficits  2. Urinary retention. Follows with urology next week  will need Foley change through home health

## 2017-03-28 NOTE — Telephone Encounter (Signed)
Verbal order given from Dr Wynn [facility]erKirsteins [facility] and she requested order to be faxed to the office [phone]. Faxed by Richarda BladeK Wessling CMA

## 2017-03-28 NOTE — Patient Instructions (Signed)
Call if OT, SLP not started this week  Will try to move up appt for Neuro psych  [facility].  Look at ways to reduce traction on foley.

## 2017-03-29 ENCOUNTER — Telehealth: Payer: Self-pay | Admitting: *Deleted

## 2017-03-29 NOTE — Telephone Encounter (Signed)
Lynita Lombardakenya Anderson,  RN, [facility], left a message stating that she went out to see the patient and was able to remove the Foley Catheter.  She was not able to reinsert.  Patient was extremely uncomfortable, wriggling, writhing in pain. Daughter called mother and decision was made to not reinsert. They will monitor the patient 24 hours  [facility]  If problems arise they will address at that visit. Also,  Home Health nurse reports good amount of blood, balloon was blood tinged, obvious signs of discomfort.  FYI and advise if necessary

## 2017-03-30 ENCOUNTER — Other Ambulatory Visit: Payer: Self-pay | Admitting: Nurse Practitioner

## 2017-03-30 ENCOUNTER — Ambulatory Visit (INDEPENDENT_AMBULATORY_CARE_PROVIDER_SITE_OTHER): Payer: 59

## 2017-03-30 ENCOUNTER — Telehealth: Payer: Self-pay | Admitting: *Deleted

## 2017-03-30 DIAGNOSIS — I4891 Unspecified atrial fibrillation: Secondary | ICD-10-CM | POA: Diagnosis not present

## 2017-03-30 DIAGNOSIS — I639 Cerebral infarction, unspecified: Secondary | ICD-10-CM

## 2017-03-30 NOTE — Telephone Encounter (Signed)
[facility] notified

## 2017-03-30 NOTE — Telephone Encounter (Signed)
[facility] called to alert of interactions between Tizanidine and some of Frank Moss medications such as amlodipine, flomax, and lexapro.  This is an FYI call they have to alert MD when their review of meds trigger interaction warnings.

## 2017-03-30 NOTE — Telephone Encounter (Signed)
He has been monitored in the hospital setting on all these medications without complications. I would continue these

## 2017-04-05 ENCOUNTER — Other Ambulatory Visit: Payer: Self-pay | Admitting: Physical Medicine & Rehabilitation

## 2017-04-12 DIAGNOSIS — Z029 Encounter for administrative examinations, unspecified: Secondary | ICD-10-CM

## 2017-04-25 ENCOUNTER — Ambulatory Visit: Payer: 59 | Admitting: Physical Medicine & Rehabilitation

## 2017-04-28 ENCOUNTER — Encounter: Payer: 59 | Attending: Psychology | Admitting: Psychology

## 2017-04-28 ENCOUNTER — Encounter: Payer: Self-pay | Admitting: Psychology

## 2017-04-28 DIAGNOSIS — I2510 Atherosclerotic heart disease of native coronary artery without angina pectoris: Secondary | ICD-10-CM | POA: Insufficient documentation

## 2017-04-28 DIAGNOSIS — R339 Retention of urine, unspecified: Secondary | ICD-10-CM | POA: Insufficient documentation

## 2017-04-28 DIAGNOSIS — I6930 Unspecified sequelae of cerebral infarction: Secondary | ICD-10-CM | POA: Insufficient documentation

## 2017-04-28 DIAGNOSIS — I69319 Unspecified symptoms and signs involving cognitive functions following cerebral infarction: Secondary | ICD-10-CM | POA: Diagnosis present

## 2017-04-28 DIAGNOSIS — F322 Major depressive disorder, single episode, severe without psychotic features: Secondary | ICD-10-CM | POA: Diagnosis not present

## 2017-04-28 DIAGNOSIS — I679 Cerebrovascular disease, unspecified: Secondary | ICD-10-CM

## 2017-04-28 DIAGNOSIS — Z9114 Patient's other noncompliance with medication regimen: Secondary | ICD-10-CM | POA: Diagnosis not present

## 2017-04-28 DIAGNOSIS — F329 Major depressive disorder, single episode, unspecified: Secondary | ICD-10-CM | POA: Insufficient documentation

## 2017-04-28 DIAGNOSIS — G8114 Spastic hemiplegia affecting left nondominant side: Secondary | ICD-10-CM | POA: Diagnosis not present

## 2017-04-28 DIAGNOSIS — E785 Hyperlipidemia, unspecified: Secondary | ICD-10-CM | POA: Insufficient documentation

## 2017-04-28 DIAGNOSIS — E1165 Type 2 diabetes mellitus with hyperglycemia: Secondary | ICD-10-CM | POA: Diagnosis not present

## 2017-04-28 DIAGNOSIS — I10 Essential (primary) hypertension: Secondary | ICD-10-CM | POA: Diagnosis not present

## 2017-04-28 DIAGNOSIS — E11319 Type 2 diabetes mellitus with unspecified diabetic retinopathy without macular edema: Secondary | ICD-10-CM | POA: Diagnosis not present

## 2017-04-28 NOTE — Progress Notes (Signed)
Patient:  Frank Moss   DOB: 16-Feb-1971  MR Number: [id number]  Location: [facility] PHYSICAL MEDICINE AND REHABILITATION [address] [place] [address] [id number] [name] Dept: [phone]  Start: 10 AM End: 11 AM  Provider/Observer:     Hershal Coria PSYD  Chief Complaint:      Chief Complaint  Patient presents with  . Anxiety  . Depression  . Memory Loss  . Sleeping Problem  . Stress    Reason For Service:      Frank Moss referred for neuropsychological consultation due to adjustment issues following an acute infarct of the right anterior thalamus and subacute right ACA infarct as well as chronic microvascular ischemic changes. There was some significant thalamic involvement also identified. The patient had had prior cerebrovascular issues and likely it had multiple small subacute infarcts going back at least 2 years. The patient had not been taking his medications appropriately and had become increasingly different with regard to personality styles and interaction with his family prior to the significant stroke. The major event occurred on 02/08/2017. The patient had a recent prior ACA stroke in generally the same region on 01/20/2017. At that point he had been admitted to [facility] and then referred to a skilled nursing facility for rehabilitation. Assessment for therapeutic options following the second vascular event indicated maximum assist level of therapy and training. He was recommended for the comprehensive inpatient rehabilitative program. The current neuropsychological evaluation was to assist in treatment planning and care with regard to adaption and coping with the recent multiple cerebrovascular events.  The patient has now been discharged home but is continuing to have significant behavioral and neurocognitive deficits. The patient is having severe disinhibition and there increasing  conflicts and difficulties between the patient and his wife. The patient's daughter has moved down to help with the majority of his care. However, this level of care is overwhelming to caregivers themselves and there is increasing conflict between the patient and his wife. The patient is also been experiencing significant increase in symptoms related to depression. They have increased his Lexapro to 20 mg.  Interventions Strategy:  Therapeutic interventions to try to improve coping and adaptive skills and work on issues related to best placement and best coping for the likely permanent neurocognitive neuropsychological deficits.  Participation Level:   Active  Participation Quality:  Inattentive and Redirectable      Behavioral Observation:  Well Groomed, Lethargic, and Labile and Tearful.   Current Psychosocial Factors: There are increasing difficulties within the relationship between the patient and his wife. She is become overwhelmed and frustrated with his inability to inhibit behaviors. He is engaging in masturbation or masturbation like activities impulsively at inopportune times. He is trying to get out of his bed even though he cannot walk. He has been found on the ground in the morning after getting out of his bed at night. He has removed his safety bars from his bed at various times. The patient has become increasingly depressed and displaced crying spells and describing suicidal faults. However, he is not in access of his medications to conduct and self-harm.  Content of Session:   Reviewed current symptoms and worked on therapeutic interventions. Both the patient and his wife were present for the session. We went over some of the major conflicts that have been brewing. Worked on planning possible transfer to an assisted living facility.  Current Status:   The patient  has been having increasing symptoms of major depression that have included suicidal ideation. Because of the patient's  significant physical limitations he does not have access to his medications and he is not engaged in specific self-harm behaviors but has described suicidal ideation and reported that he feels like he would be better off if he was dead. We addressed this issue specifically. The patient did contract for safety today.  Patient Progress:   The patient has essentially stopped improving as far as his cerebrovascular incident and is resulting neurobehavioral and neuropsychological deficits. He has become increasingly depressed and frustrated and there are increasing conflicts between the patient and caregiver/wife.  Target Goals:   Target goals include working on building better coping skills and strategies and working on long-term plans for how to better manage and cope with the residual neurological deficits.  Last Reviewed:   04/28/2017  Impression/Diagnosis:    Frank Moss referred for neuropsychological consultation due to adjustment issues following an acute infarct of the right anterior thalamus and subacute right ACA infarct as well as chronic microvascular ischemic changes. There was some significant thalamic involvement also identified. The patient had had prior cerebrovascular issues and likely it had multiple small subacute infarcts going back at least 2 years. The patient had not been taking his medications appropriately and had become increasingly different with regard to personality styles and interaction with his family prior to the significant stroke. The major event occurred on 02/08/2017. The patient had a recent prior ACA stroke in generally the same region on 01/20/2017. At that point he had been admitted to [facility] and then referred to a skilled nursing facility for rehabilitation. Assessment for therapeutic options following the second vascular event indicated maximum assist level of therapy and training. He was recommended for the comprehensive inpatient rehabilitative program.  The current neuropsychological evaluation was to assist in treatment planning and care with regard to adaption and coping with the recent multiple cerebrovascular events.  The patient has now been discharged home but is continuing to have significant behavioral and neurocognitive deficits. The patient is having severe disinhibition and there increasing conflicts and difficulties between the patient and his wife. The patient's daughter has moved down to help with the majority of his care. However, this level of care is overwhelming to caregivers themselves and there is increasing conflict between the patient and his wife. The patient is also been experiencing significant increase in symptoms related to depression. They have increased his Lexapro to 20 mg.  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

## 2017-05-03 ENCOUNTER — Ambulatory Visit (HOSPITAL_BASED_OUTPATIENT_CLINIC_OR_DEPARTMENT_OTHER): Payer: 59 | Admitting: Physical Medicine & Rehabilitation

## 2017-05-03 ENCOUNTER — Encounter: Payer: Self-pay | Admitting: Physical Medicine & Rehabilitation

## 2017-05-03 VITALS — BP 138/80 | HR 92 | Resp 14

## 2017-05-03 DIAGNOSIS — I69319 Unspecified symptoms and signs involving cognitive functions following cerebral infarction: Secondary | ICD-10-CM

## 2017-05-03 DIAGNOSIS — I679 Cerebrovascular disease, unspecified: Secondary | ICD-10-CM

## 2017-05-03 DIAGNOSIS — I69390 Apraxia following cerebral infarction: Secondary | ICD-10-CM | POA: Diagnosis not present

## 2017-05-03 DIAGNOSIS — G8114 Spastic hemiplegia affecting left nondominant side: Secondary | ICD-10-CM

## 2017-05-03 DIAGNOSIS — R414 Neurologic neglect syndrome: Secondary | ICD-10-CM | POA: Diagnosis not present

## 2017-05-03 DIAGNOSIS — I6930 Unspecified sequelae of cerebral infarction: Secondary | ICD-10-CM | POA: Diagnosis not present

## 2017-05-03 MED ORDER — TIZANIDINE HCL 4 MG PO TABS: 4.0000 mg | ORAL_TABLET | Freq: Three times a day (TID) | ORAL | 1 refills | Status: DC

## 2017-05-03 NOTE — Patient Instructions (Addendum)
Please talk to your primary physician about starting something like Abilify  We'll Botulinum toxin injection next visit

## 2017-05-03 NOTE — Progress Notes (Signed)
Subjective:    Patient ID: Frank Moss, male    DOB: July 13, 1971, 46 y.o.   MRN: [id number] Admit date: 02/11/2017 Discharge date: 03/11/2017 46 year old right handed male with history of T2DM with retinopathy, HTN,  hyperlipidemia and medication noncompliance who had a right ACA stroke on 01/20/2017. He was admitted to [facility] and started on aspirin and Plavix for his stroke prophylaxis. Hemoglobin A1c was 11.2, LDL- 65, and triglycerides-  298. During that hospitalization, he had evidence of depression as well as urinary retention and a Foley catheter was placed. He was discharged to a skilled nursing facility for rehab. On 02/08/2017, he had worsening of swallow function and was sent to [facility] ED. MRI brain done showing  small acute infarct in the right anterior thalamus and a subacute right ACA infarct. TEE done  and revealed normal LVF, no shunt, no thrombus or vegetation and trivial pericardial effusion. Stroke felt to be embolic due to severe A2 and P2 segment atheromatous stenosis, advanced SVD and poorly controlled diabetes. Patient with cognitive deficits with delayed processing, left spastic hemiparesis with deficits in mobility and self care tasks. CIR recommended by MD and rehab team.  46 year old right handed male with history of T2DM with retinopathy, HTN,  hyperlipidemia and medication noncompliance who had a right ACA stroke on 01/20/2017. He was admitted to [facility] and started on aspirin and Plavix for his stroke prophylaxis. Hemoglobin A1c was 11.2, LDL- 65, and triglycerides-  298. During that hospitalization, he had evidence of depression as well as urinary retention and a Foley catheter was placed. He was discharged to a skilled nursing facility for rehab. On 02/08/2017, he had worsening of swallow function and was sent to [facility] ED. MRI brain done showing  small acute infarct in the right anterior thalamus and a subacute right ACA infarct. TEE done  and revealed normal LVF,  no shunt, no thrombus or vegetation and trivial pericardial effusion. Stroke felt to be embolic due to severe A2 and P2 segment atheromatous stenosis, advanced SVD and poorly controlled diabetes. Patient with cognitive deficits with delayed processing, left spastic hemiparesis with deficits in mobility and self care tasks. CIR recommended by MD and rehab team.    Admit date: 02/11/2017 Discharge date: 03/11/2017  HPI  Wife available by phone, daughter and daughter's friend who are also caregivers are in room.  Main concern is left knee flexion. It is difficult to extend, this interferes with standing as well as with sleep. He has been on tizanidine 4 mg 3 times a day, some sedation but not overly so, patient continues have problem sleeping at night. Last Botox 100U Left Hamstrings on 8/5  Problems with depression and has been treated by primary care, Lexapro, dose has been increased to 20 mg, neuropsychology is recommending Abilify, we discussed that patient would need to follow up with primary care on this one Pain Inventory Average Pain 7 Pain Right Now 9 My pain is sharp  In the last 24 hours, has pain interfered with the following? General activity 10 Relation with others 10 Enjoyment of life 10 What TIME of day is your pain at its worst? morning Sleep (in general) Poor  Pain is worse with: walking, bending and standing Pain improves with: rest Relief from Meds: 1  Mobility use a wheelchair needs help with transfers  Function disabled: date disabled .  Neuro/Psych bladder control problems tremor trouble walking spasms confusion depression anxiety suicidal thoughts  Prior Studies Any changes since last visit?  no  Physicians involved in your care Any changes since last visit?  no   Family History  Problem Relation Age of Onset  . Hypertension Mother   . Depression Mother   . Cancer Neg Hx   . Diabetes Neg Hx   . Stroke Neg Hx    Social History   Social  History  . Marital status: Married    Spouse name: N/A  . Number of children: N/A  . Years of education: N/A   Social History Main Topics  . Smoking status: Never Smoker  . Smokeless tobacco: Never Used  . Alcohol use No  . Drug use: No  . Sexual activity: Not Asked   Other Topics Concern  . None   Social History Narrative   Has a BS in business administration and an MBA.  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   Past Surgical History:  Procedure Laterality Date  . TEE WITHOUT CARDIOVERSION N/A 02/11/2017   Procedure: TRANSESOPHAGEAL ECHOCARDIOGRAM (TEE);  Surgeon: Quintella Reichert, MD;  Location: [facility] OR;  Service: Cardiovascular;  Laterality: N/A;  . TEE WITHOUT CARDIOVERSION N/A 02/11/2017   Procedure: TRANSESOPHAGEAL ECHOCARDIOGRAM (TEE);  Surgeon: Quintella Reichert, MD;  Location: [facility] ENDOSCOPY;  Service: Cardiovascular;  Laterality: N/A;   Past Medical History:  Diagnosis Date  . Abnormal ECG   . Acute ischemic right ACA stroke (HCC)   . Coronary artery disease, occlusive   . Dysfunction of eustachian tube   . History of right ACA stroke   . Hyperlipidemia associated with type 2 diabetes mellitus (HCC)   . Hypertension   . Impotence of organic origin   . Lumbago   . Lymphadenitis    Unspecified, except mesenteric  . Type 2 diabetes, uncontrolled, with retinopathy (HCC)   . Urinary retention 01/2017   BP 138/80   Pulse 92   Resp 14   SpO2 94%   Opioid Risk Score:   Fall Risk Score:  `1  Depression screen PHQ 2/9  Depression screen PHQ 2/9 03/28/2017  Decreased Interest 3  Down, Depressed, Hopeless 3  PHQ - 2 Score 6  Altered sleeping 3  Tired, decreased energy 3  Change in appetite 1  Feeling bad or failure about yourself  3  Trouble concentrating 3  Moving slowly or fidgety/restless 2  Suicidal thoughts 3  PHQ-9 Score 24  Difficult doing work/chores Extremely dIfficult    Review of Systems  Constitutional: Negative.   HENT:  Negative.   Eyes: Negative.   Cardiovascular: Negative.   Gastrointestinal: Positive for diarrhea.  Endocrine: Negative.   Genitourinary: Positive for difficulty urinating.  Musculoskeletal: Positive for gait problem.       Spasms   Skin: Positive for rash.  Allergic/Immunologic: Negative.   Neurological: Positive for tremors.  Psychiatric/Behavioral: Positive for confusion, dysphoric mood and suicidal ideas. The patient is nervous/anxious.   All other systems reviewed and are negative.      Objective:   Physical Exam  Constitutional: He appears well-developed and well-nourished.  HENT:  Head: Normocephalic and atraumatic.  Eyes: Pupils are equal, round, and reactive to light. Conjunctivae and EOM are normal.  Psychiatric: His affect is blunt. His speech is delayed. He is slowed and withdrawn. He exhibits abnormal recent memory. He is inattentive.  Nursing note and vitals reviewed.  Alert Oriented to person and place, not day, or month, + to Year + to CVA   Patient has absent sensation to light touch in the left upper  limb and left lower limb. Visual fields are intact. However, he does have left neglect on confrontation testing. Motor strength testing is limited, patient unable to move left upper extremity to command, however, is able to spontaneously move his left upper extremity antigravity. Left lower limb has 3 minus. Knee extension, no active ankle dorsiflexion, plantarflexion  Speech without dysarthria or aphasia, poor initiation.      Assessment & Plan:  1. Right ACA infarct as well as history of small thalamic infarct and evidence of small vessel disease. Periventricular white matter. His major stroke was the right ACA, which has caused left lower limb. 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 Left upper limb and severe sensory deficits on the left side in the upper and  lower limb.  Continue home health therapy  Will repeat Botox. Left hamstring. A total of 300 units  Discussed with family at length Over half of the 25 min visit was spent counseling and coordinating care.

## 2017-05-09 ENCOUNTER — Other Ambulatory Visit: Payer: Self-pay | Admitting: Physical Medicine & Rehabilitation

## 2017-05-24 ENCOUNTER — Encounter: Payer: 59 | Attending: Physical Medicine & Rehabilitation

## 2017-05-24 ENCOUNTER — Encounter: Payer: Self-pay | Admitting: Physical Medicine & Rehabilitation

## 2017-05-24 ENCOUNTER — Ambulatory Visit (HOSPITAL_BASED_OUTPATIENT_CLINIC_OR_DEPARTMENT_OTHER): Payer: 59 | Admitting: Physical Medicine & Rehabilitation

## 2017-05-24 VITALS — BP 126/78 | HR 96

## 2017-05-24 DIAGNOSIS — G8114 Spastic hemiplegia affecting left nondominant side: Secondary | ICD-10-CM

## 2017-05-24 DIAGNOSIS — I2510 Atherosclerotic heart disease of native coronary artery without angina pectoris: Secondary | ICD-10-CM | POA: Diagnosis not present

## 2017-05-24 DIAGNOSIS — E1165 Type 2 diabetes mellitus with hyperglycemia: Secondary | ICD-10-CM | POA: Diagnosis not present

## 2017-05-24 DIAGNOSIS — F329 Major depressive disorder, single episode, unspecified: Secondary | ICD-10-CM | POA: Diagnosis not present

## 2017-05-24 DIAGNOSIS — I6930 Unspecified sequelae of cerebral infarction: Secondary | ICD-10-CM | POA: Insufficient documentation

## 2017-05-24 DIAGNOSIS — R339 Retention of urine, unspecified: Secondary | ICD-10-CM | POA: Insufficient documentation

## 2017-05-24 DIAGNOSIS — I69319 Unspecified symptoms and signs involving cognitive functions following cerebral infarction: Secondary | ICD-10-CM | POA: Insufficient documentation

## 2017-05-24 DIAGNOSIS — I10 Essential (primary) hypertension: Secondary | ICD-10-CM | POA: Insufficient documentation

## 2017-05-24 DIAGNOSIS — I679 Cerebrovascular disease, unspecified: Secondary | ICD-10-CM | POA: Insufficient documentation

## 2017-05-24 DIAGNOSIS — E785 Hyperlipidemia, unspecified: Secondary | ICD-10-CM | POA: Diagnosis not present

## 2017-05-24 DIAGNOSIS — Z9114 Patient's other noncompliance with medication regimen: Secondary | ICD-10-CM | POA: Diagnosis not present

## 2017-05-24 DIAGNOSIS — E11319 Type 2 diabetes mellitus with unspecified diabetic retinopathy without macular edema: Secondary | ICD-10-CM | POA: Diagnosis not present

## 2017-05-24 NOTE — Patient Instructions (Signed)

## 2017-05-24 NOTE — Progress Notes (Signed)
Botox Injection for spasticity using needle EMG guidance  Dilution: 50 Units/ml Indication: Severe spasticity which interferes with ADL,mobility and/or  hygiene and is unresponsive to medication management and other conservative care Informed consent was obtained after describing risks and benefits of the procedure with the patient. This includes bleeding, bruising, infection, excessive weakness, or medication side effects. A REMS form is on file and signed. Needle: 25g 2" needle electrode Number of units per muscle  Hamstrings300 All injections were done after obtaining appropriate EMG activity and after negative drawback for blood. The patient tolerated the procedure well. Post procedure instructions were given. A followup appointment was made.

## 2017-06-03 ENCOUNTER — Encounter: Payer: Self-pay | Admitting: *Deleted

## 2017-06-28 ENCOUNTER — Encounter: Payer: 59 | Admitting: Psychology

## 2017-06-30 ENCOUNTER — Encounter: Payer: 59 | Attending: Psychology | Admitting: Psychology

## 2017-06-30 ENCOUNTER — Encounter: Payer: Self-pay | Admitting: Psychology

## 2017-06-30 DIAGNOSIS — R414 Neurologic neglect syndrome: Secondary | ICD-10-CM | POA: Diagnosis not present

## 2017-06-30 DIAGNOSIS — F322 Major depressive disorder, single episode, severe without psychotic features: Secondary | ICD-10-CM

## 2017-06-30 DIAGNOSIS — E11319 Type 2 diabetes mellitus with unspecified diabetic retinopathy without macular edema: Secondary | ICD-10-CM | POA: Insufficient documentation

## 2017-06-30 DIAGNOSIS — I679 Cerebrovascular disease, unspecified: Secondary | ICD-10-CM | POA: Insufficient documentation

## 2017-06-30 DIAGNOSIS — G8114 Spastic hemiplegia affecting left nondominant side: Secondary | ICD-10-CM | POA: Insufficient documentation

## 2017-06-30 DIAGNOSIS — I10 Essential (primary) hypertension: Secondary | ICD-10-CM | POA: Insufficient documentation

## 2017-06-30 DIAGNOSIS — F329 Major depressive disorder, single episode, unspecified: Secondary | ICD-10-CM | POA: Diagnosis not present

## 2017-06-30 DIAGNOSIS — I2510 Atherosclerotic heart disease of native coronary artery without angina pectoris: Secondary | ICD-10-CM | POA: Insufficient documentation

## 2017-06-30 DIAGNOSIS — I6930 Unspecified sequelae of cerebral infarction: Secondary | ICD-10-CM | POA: Insufficient documentation

## 2017-06-30 DIAGNOSIS — I69319 Unspecified symptoms and signs involving cognitive functions following cerebral infarction: Secondary | ICD-10-CM | POA: Diagnosis not present

## 2017-06-30 DIAGNOSIS — E1165 Type 2 diabetes mellitus with hyperglycemia: Secondary | ICD-10-CM | POA: Diagnosis not present

## 2017-06-30 DIAGNOSIS — E785 Hyperlipidemia, unspecified: Secondary | ICD-10-CM | POA: Insufficient documentation

## 2017-06-30 DIAGNOSIS — Z9114 Patient's other noncompliance with medication regimen: Secondary | ICD-10-CM | POA: Insufficient documentation

## 2017-06-30 DIAGNOSIS — R339 Retention of urine, unspecified: Secondary | ICD-10-CM | POA: Diagnosis not present

## 2017-06-30 DIAGNOSIS — I69390 Apraxia following cerebral infarction: Secondary | ICD-10-CM | POA: Diagnosis not present

## 2017-06-30 NOTE — Progress Notes (Signed)
Patient:  Frank Moss   DOB: [birth year]  MR Number: [id number]  Location: [facility] PHYSICAL MEDICINE AND REHABILITATION [address], [place] [address] [id number] [place] [id number] Dept: [phone]  Start: 8 AM End: 9 AM  Provider/Observer:     Hershal CoriaJohn R Svara Twyman PSYD  Chief Complaint:      Chief Complaint  Patient presents with  . Agitation  . Depression  . Anxiety  . Memory Loss  . Other  . Stress    Reason For Service:      Frank Albinoaymond Heardwas referred for neuropsychological consultation due to adjustment issues following an acute infarct of the right anterior thalamus and subacute right ACA infarct as well as chronic microvascular ischemic changes. There was some significant thalamic involvement also identified. The patient had had prior cerebrovascular issues and likely it had multiple small subacute infarcts going back at least 2 years. The patient had not been taking his medications appropriately and had become increasingly different with regard to personality styles and interaction with his family prior to the significant stroke. The major event occurred on 02/08/2017. The patient had a recent prior ACA stroke in generally the same region on 01/20/2017. At that point he had been admitted to [facility] and then referred to a skilled nursing facility for rehabilitation. Assessment for therapeutic options following the second vascular event indicated maximum assist level of therapy and training. He was recommended for the comprehensive inpatient rehabilitative program. The current neuropsychological evaluation was to assist in treatment planning and care with regard to adaption and coping with the recent multiple cerebrovascular events.  The patient has now been discharged home but is continuing to have significant behavioral and neurocognitive deficits. The patient is having severe disinhibition and there increasing  conflicts and difficulties between the patient and his wife. The patient's daughter has moved down to help with the majority of his care. However, this level of care is overwhelming to caregivers themselves and there is increasing conflict between the patient and his wife. The patient is also been experiencing significant increase in symptoms related to depression. They have increased his Lexapro to 20 mg.  Interventions Strategy:  Therapeutic interventions to try to improve coping and adaptive skills and work on issues related to best placement and best coping for the likely permanent neurocognitive neuropsychological deficits.  Participation Level:   Active  Participation Quality:  Inattentive and Redirectable      Behavioral Observation:  Well Groomed, Lethargic, and Labile and Tearful.   Current Psychosocial Factors: Patient and his wife both report that there are some significant stressors going on within the relationship.  There is ongoing conflict and agitation between the 2 of them.  The patient reports that they are now looking at him moving into an assisted living situation as they are having great difficulty finding CNA's that are trustworthy and reliable.  The patient's wife is very frustrated that the patient will try to get out of bed and move around in his own and suffer significant falls.  The patient reports that he feels like it is likely the best thing for them to do as far as separation at this time.  However, he reports that he does not want to get divorced but knows that they are increasing conflicts and difficulties.  Content of Session:   Reviewed current symptoms and worked on therapeutic interventions. Both the patient and his wife were present for the session. We went over some  of the major conflicts that have been brewing. Worked on planning possible transfer to an assisted living facility.  Current Status:   The patient continues to have significant issues with depression  and aggitation.  He and his wife are looking at moving him to assisted living situation after christmas.  While he is upset reports that this may be best for him given the ability to continue to work on strength.  He has been approved for disability.  Patient Progress:   The patient has essentially stopped improving as far as his cerebrovascular incident and is resulting neurobehavioral and neuropsychological deficits. He has become increasingly depressed and frustrated and there are increasing conflicts between the patient and caregiver/wife.  Target Goals:   Target goals include working on building better coping skills and strategies and working on long-term plans for how to better manage and cope with the residual neurological deficits.  Last Reviewed:   06/30/2017  Impression/Diagnosis:    Frank Albinoaymond Heardwas referred for neuropsychological consultation due to adjustment issues following an acute infarct of the right anterior thalamus and subacute right ACA infarct as well as chronic microvascular ischemic changes. There was some significant thalamic involvement also identified. The patient had had prior cerebrovascular issues and likely it had multiple small subacute infarcts going back at least 2 years. The patient had not been taking his medications appropriately and had become increasingly different with regard to personality styles and interaction with his family prior to the significant stroke. The major event occurred on 02/08/2017. The patient had a recent prior ACA stroke in generally the same region on 01/20/2017. At that point he had been admitted to [facility] and then referred to a skilled nursing facility for rehabilitation. Assessment for therapeutic options following the second vascular event indicated maximum assist level of therapy and training. He was recommended for the comprehensive inpatient rehabilitative program. The current neuropsychological evaluation was to assist in  treatment planning and care with regard to adaption and coping with the recent multiple cerebrovascular events.  The patient has now been discharged home but is continuing to have significant behavioral and neurocognitive deficits. The patient is having severe disinhibition and there increasing conflicts and difficulties between the patient and his wife. The patient's daughter has moved down to help with the majority of his care. However, this level of care is overwhelming to caregivers themselves and there is increasing conflict between the patient and his wife. The patient is also been experiencing significant increase in symptoms related to depression. They have increased his Lexapro to 20 mg.  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

## 2017-07-01 ENCOUNTER — Ambulatory Visit: Payer: 59 | Admitting: Physical Medicine & Rehabilitation

## 2017-07-01 ENCOUNTER — Encounter: Payer: Self-pay | Admitting: Physical Medicine & Rehabilitation

## 2017-07-01 VITALS — BP 172/94 | HR 90

## 2017-07-01 DIAGNOSIS — G8114 Spastic hemiplegia affecting left nondominant side: Secondary | ICD-10-CM

## 2017-07-01 DIAGNOSIS — I679 Cerebrovascular disease, unspecified: Secondary | ICD-10-CM | POA: Diagnosis not present

## 2017-07-01 DIAGNOSIS — I69319 Unspecified symptoms and signs involving cognitive functions following cerebral infarction: Secondary | ICD-10-CM | POA: Diagnosis not present

## 2017-07-01 NOTE — Patient Instructions (Signed)
Next appt for repeat botox

## 2017-07-01 NOTE — Progress Notes (Signed)
Subjective:    Patient ID: Frank Moss, male    DOB: 1971-04-14, 46 y.o.   MRN: [id number] 46 year old right handed male with history of T2DM with retinopathy, HTN,  hyperlipidemia and medication noncompliance who had a right ACA stroke on 01/20/2017. He was admitted to [facility] and started on aspirin and Plavix for his stroke prophylaxis. Hemoglobin A1c was 11.2, LDL- 65, and triglycerides-  298. During that hospitalization, he had evidence of depression as well as urinary retention and a Foley catheter was placed. He was discharged to a skilled nursing facility for rehab. On 02/08/2017, he had worsening of swallow function and was [facility] ED. MRI brain done showing  small acute infarct in the right anterior thalamus and a subacute right ACA infarct. TEE done  and revealed normal LVF, no shunt, no thrombus or vegetation and trivial pericardial effusion. Stroke felt to be embolic due to severe A2 and P2 segment atheromatous stenosis, advanced SVD and poorly controlled diabetes. Patient with cognitive deficits with delayed processing, left spastic hemiparesis with deficits in mobility and self care tasks HPI Some holding of food in mouth , no additional weight loss  Left hamstring injection helpful , 300U on 05/24/2017 vs 100U performed previously in the hospital in August 2018  No falls.  Working with neuropsychology. Pain Inventory Average Pain 4 Pain Right Now 3 My pain is dull and aching  In the last 24 hours, has pain interfered with the following? General activity 3 Relation with others 8 Enjoyment of life 8 What TIME of day is your pain at its worst? . Sleep (in general) Fair  Pain is worse with: walking, bending, sitting and standing Pain improves with: medication Relief from Meds: 3  Mobility walk with assistance use a walker ability to climb steps?  no do you drive?  no use a wheelchair needs help with transfers  Function disabled: date disabled . I need  assistance with the following:  feeding, dressing, bathing, meal prep, household duties and shopping  Neuro/Psych weakness tingling trouble walking spasms  Prior Studies Any changes since last visit?  no  Physicians involved in your care Any changes since last visit?  no   Family History  Problem Relation Age of Onset  . Hypertension Mother   . Depression Mother   . Cancer Neg Hx   . Diabetes Neg Hx   . Stroke Neg Hx    Social History   Socioeconomic History  . Marital status: Married    Spouse name: Not on file  . Number of children: Not on file  . Years of education: Not on file  . Highest education level: Not on file  Social Needs  . Financial resource strain: Not on file  . Food insecurity - worry: Not on file  . Food insecurity - inability: Not on file  . Transportation needs - medical: Not on file  . Transportation needs - non-medical: Not on file  Occupational History  . Not on file  Tobacco Use  . Smoking status: Never Smoker  . Smokeless tobacco: Never Used  Substance and Sexual Activity  . Alcohol use: No  . Drug use: No  . Sexual activity: Not on file  Other Topics Concern  . Not on file  Social History Narrative   Has a BS in business administration and an MBA.  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   Past Surgical History:  Procedure Laterality Date  . TEE WITHOUT CARDIOVERSION N/A 02/11/2017  Procedure: TRANSESOPHAGEAL ECHOCARDIOGRAM (TEE);  Surgeon: Quintella Reicherturner, Traci R, MD;  [facility];  Service: Cardiovascular;  Laterality: N/A;  . TEE WITHOUT CARDIOVERSION N/A 02/11/2017   Procedure: TRANSESOPHAGEAL ECHOCARDIOGRAM (TEE);  Surgeon: Quintella Reicherturner, Traci R, MD;  [facility];  Service: Cardiovascular;  Laterality: N/A;   Past Medical History:  Diagnosis Date  . Abnormal ECG   . Acute ischemic right ACA stroke (HCC)   . Coronary artery disease, occlusive   . Dysfunction of eustachian tube   . History of right ACA  stroke   . Hyperlipidemia associated with type 2 diabetes mellitus (HCC)   . Hypertension   . Impotence of organic origin   . Lumbago   . Lymphadenitis    Unspecified, except mesenteric  . Type 2 diabetes, uncontrolled, with retinopathy (HCC)   . Urinary retention 01/2017   There were no vitals taken for this visit.  Opioid Risk Score:   Fall Risk Score:  `1  Depression screen PHQ 2/9  [facility] 05/24/2017 03/28/2017  Decreased Interest - 3  Down, Depressed, Hopeless - 3  PHQ - 2 Score - 6  Altered sleeping 1 3  Tired, decreased energy 1 3  Change in appetite - 1  Feeling bad or failure about yourself  - 3  Trouble concentrating - 3  Moving slowly or fidgety/restless - 2  Suicidal thoughts - 3  PHQ-9 Score - 24  Difficult doing work/chores - Extremely dIfficult     Review of Systems  Constitutional: Positive for unexpected weight change.  HENT: Negative.   Eyes: Negative.   Respiratory: Negative.   Cardiovascular: Negative.   Gastrointestinal: Negative.   Endocrine: Negative.   Genitourinary: Negative.   Musculoskeletal: Negative.   Skin: Negative.   Allergic/Immunologic: Negative.   Neurological: Negative.   Hematological: Negative.   Psychiatric/Behavioral: Negative.   All other systems reviewed and are negative.      Objective:   Physical Exam  Constitutional: He is oriented to person, place, and time. He appears well-developed and well-nourished. No distress.  HENT:  Head: Normocephalic and atraumatic.  Eyes: Conjunctivae and EOM are normal. Pupils are equal, round, and reactive to light.  Neurological: He is alert and oriented to person, place, and time.  Skin: He is not diaphoretic.  Psychiatric: His speech is normal. Thought content normal. His affect is inappropriate. He is slowed. Cognition and memory are impaired. He does not express impulsivity.  Nursing note and vitals reviewed.  Motor strength is 4/5 in the left deltoid bicep  tricep grip 3- at the left hip flexor and knee extensor 0 at the ankle dorsiflexor Tone left hamstring [place] 2       Assessment & Plan:  #1.  Right anterior cerebral artery infarct causing left lower extremity greater than left upper extremity paresis as well as poor initiation poor awareness and residual left neglect. His left hamstring spasticity has greatly improved since Botox injection and he is now ambulating better according to family.

## 2017-08-11 ENCOUNTER — Encounter: Payer: 59 | Admitting: Psychology

## 2017-08-19 ENCOUNTER — Ambulatory Visit: Payer: 59 | Admitting: Physical Medicine & Rehabilitation

## 2017-10-27 ENCOUNTER — Ambulatory Visit: Payer: 59 | Admitting: Physical Medicine & Rehabilitation

## 2017-10-27 ENCOUNTER — Ambulatory Visit: Payer: 59

## 2017-10-28 ENCOUNTER — Encounter: Payer: Self-pay | Admitting: Physical Medicine & Rehabilitation

## 2017-10-28 ENCOUNTER — Encounter: Payer: 59 | Attending: Physical Medicine & Rehabilitation

## 2017-10-28 ENCOUNTER — Ambulatory Visit (HOSPITAL_BASED_OUTPATIENT_CLINIC_OR_DEPARTMENT_OTHER): Payer: 59 | Admitting: Physical Medicine & Rehabilitation

## 2017-10-28 VITALS — BP 137/80 | HR 78 | Ht 74.0 in | Wt 220.0 lb

## 2017-10-28 DIAGNOSIS — I679 Cerebrovascular disease, unspecified: Secondary | ICD-10-CM | POA: Diagnosis not present

## 2017-10-28 DIAGNOSIS — G8114 Spastic hemiplegia affecting left nondominant side: Secondary | ICD-10-CM | POA: Diagnosis not present

## 2017-10-28 NOTE — Patient Instructions (Signed)

## 2017-10-28 NOTE — Progress Notes (Signed)
Botox Injection for spasticity using needle EMG guidance  Dilution: 50 Units/ml Indication: Severe spasticity which interferes with ADL,mobility and/or  hygiene and is unresponsive to medication management and other conservative care Informed consent was obtained after describing risks and benefits of the procedure with the patient. This includes bleeding, bruising, infection, excessive weakness, or medication side effects. A REMS form is on file and signed. Needle: 25g 2" needle electrode Number of units per muscle  [id number]  All injections were done after obtaining appropriate EMG ( MORE NOTABLE ON LATERAL HAMSTRINGS activity and after negative drawback for blood. The patient tolerated the procedure well. Post procedure instructions were given. A followup appointment was made.

## 2018-01-27 ENCOUNTER — Ambulatory Visit: Payer: 59 | Admitting: Physical Medicine & Rehabilitation

## 2018-01-27 ENCOUNTER — Encounter: Payer: 59 | Attending: Physical Medicine & Rehabilitation

## 2018-01-27 DIAGNOSIS — I679 Cerebrovascular disease, unspecified: Secondary | ICD-10-CM | POA: Insufficient documentation

## 2018-01-27 DIAGNOSIS — G8114 Spastic hemiplegia affecting left nondominant side: Secondary | ICD-10-CM | POA: Insufficient documentation

## 2018-03-06 IMAGING — CT CT HEAD CODE STROKE
4 series · 16 of 47 positions shown, 18 images · non-contrast
Comparison: None.

CLINICAL DATA: Code stroke. New onset dysphagia. Previous ACA
territory infarct. Left hemi paresis.

EXAM:
CT HEAD WITHOUT CONTRAST
TECHNIQUE: Contiguous axial images were obtained from the base of the skull
through the vertex without intravenous contrast.

[Series 3: head wo · axial · 0.47mm/px · z∈[-136,-0]mm · 7 of 37 slices shown, 9 images]
[im 5/37  brain]
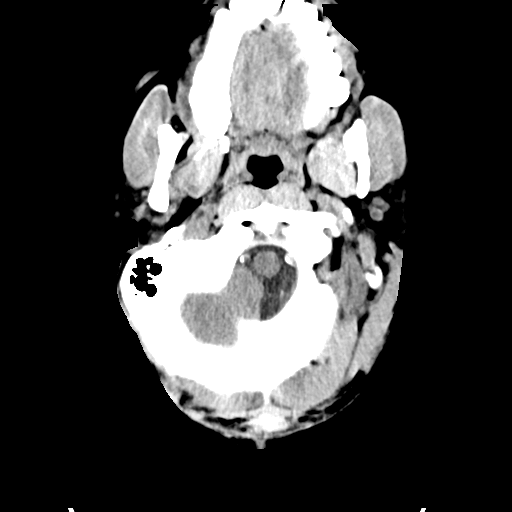
[im 5/37  bone]
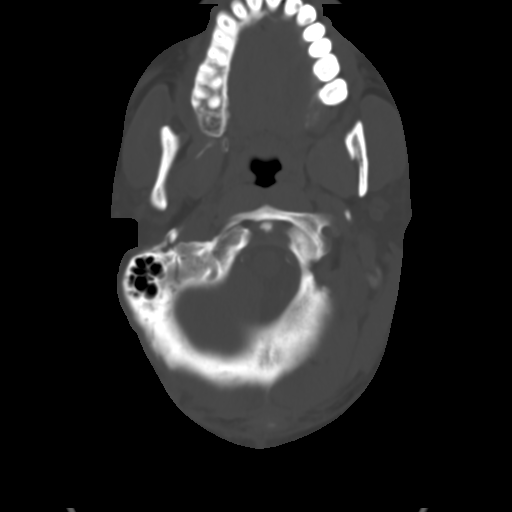
[im 10/37  brain]
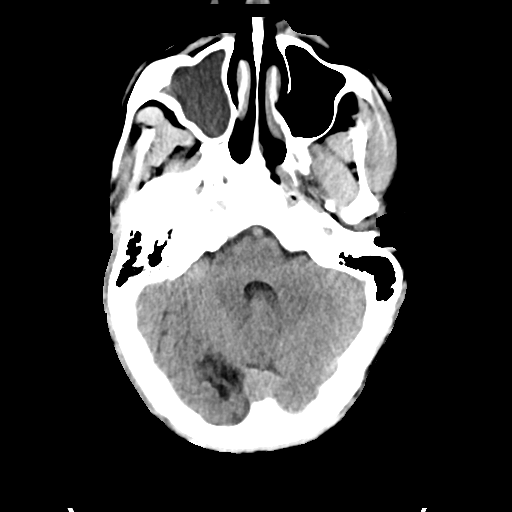
[im 14/37  brain]
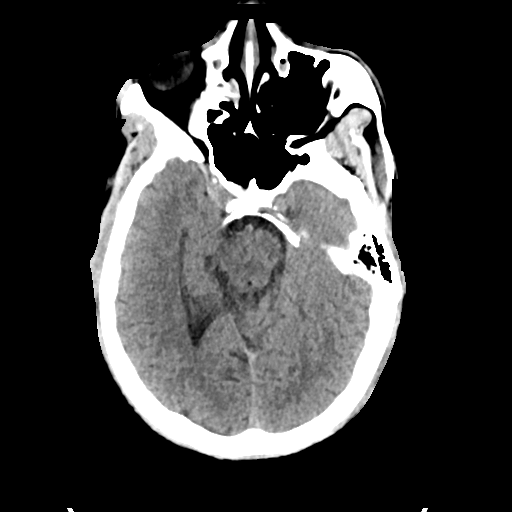
[im 19/37  brain]
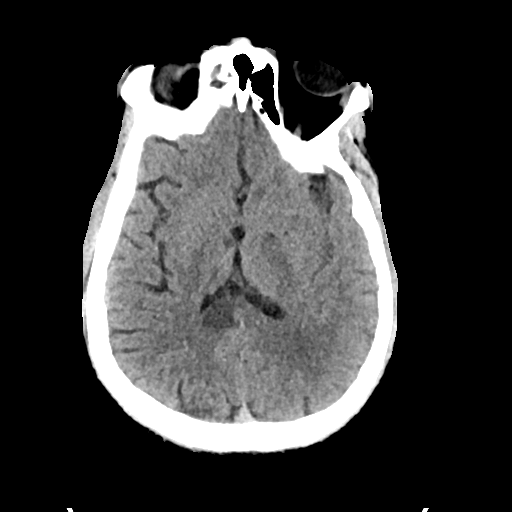
[im 23/37  brain]
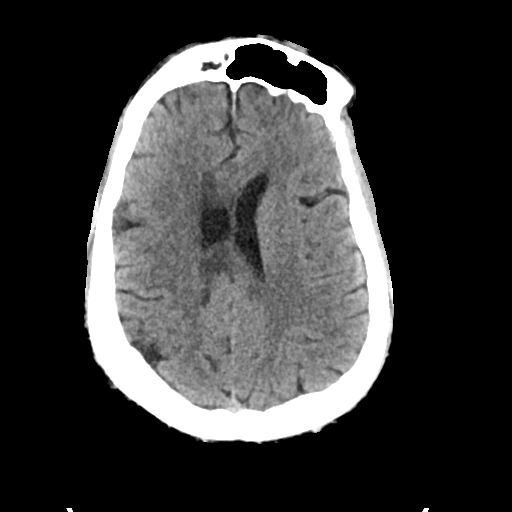
[im 23/37  bone]
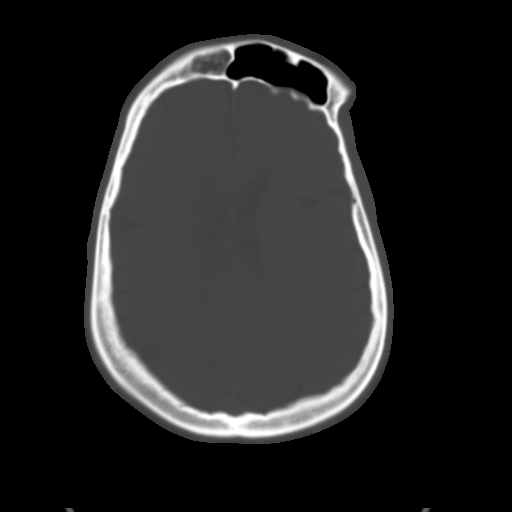
[im 28/37  brain]
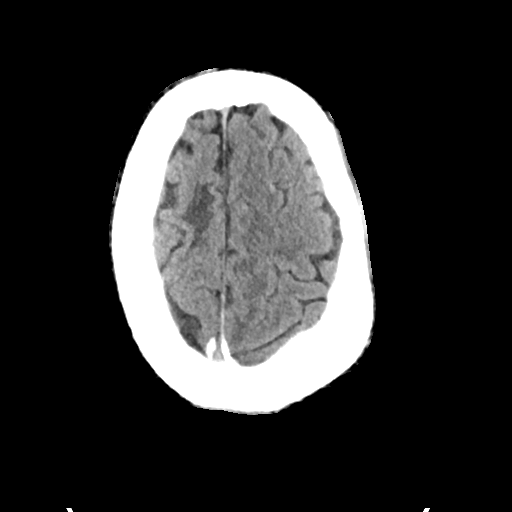
[im 32/37  brain]
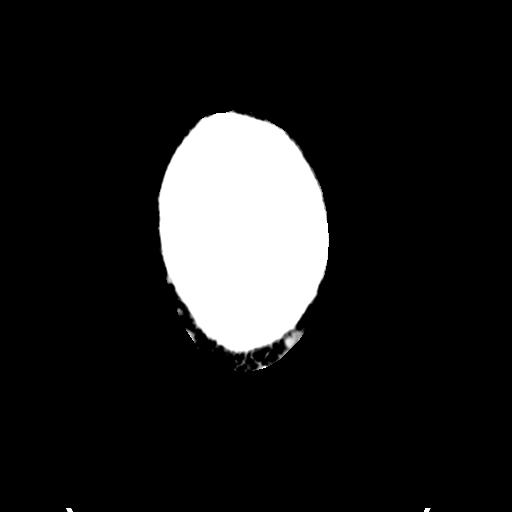

[Series 4: head bone · axial · 0.47mm/px · z∈[-138,-102]mm · 3 of 91 slices shown]
[im 10/91  bone]
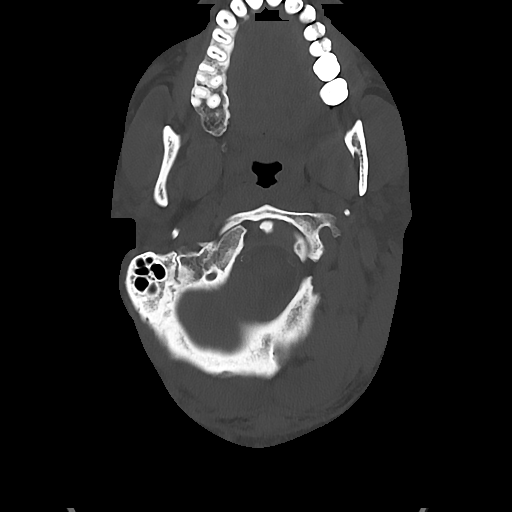
[im 19/91  bone]
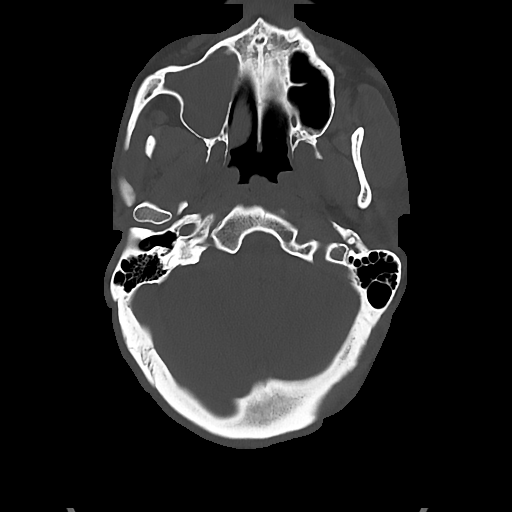
[im 28/91  bone]
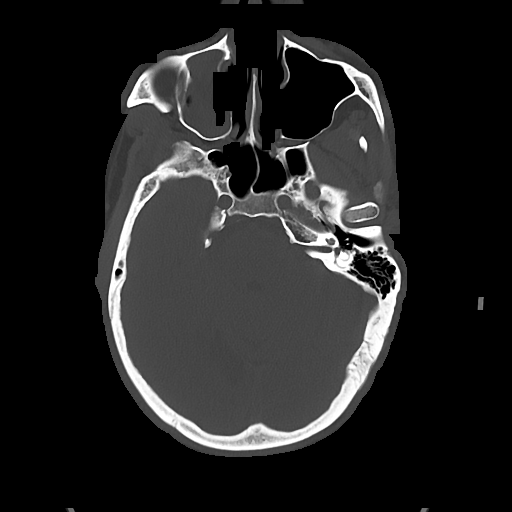

[Series 5: cor soft · coronal · 0.35mm/px · 3 of 76 slices shown]
[im 27/76  brain]
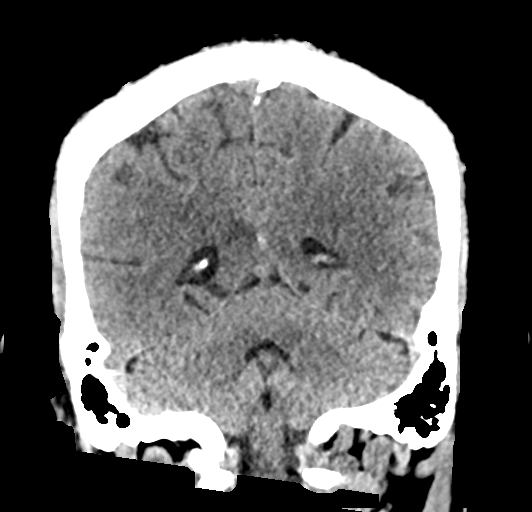
[im 34/76  brain]
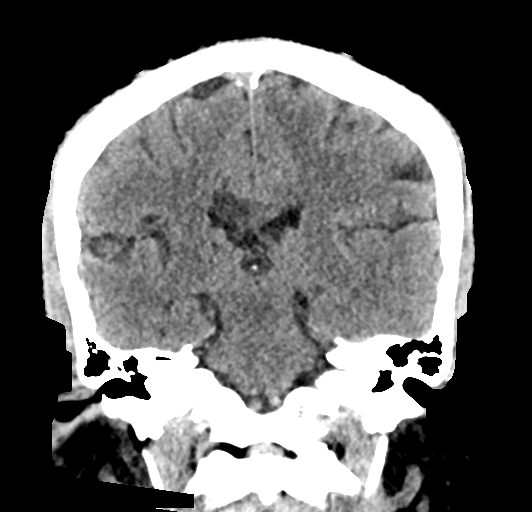
[im 42/76  brain]
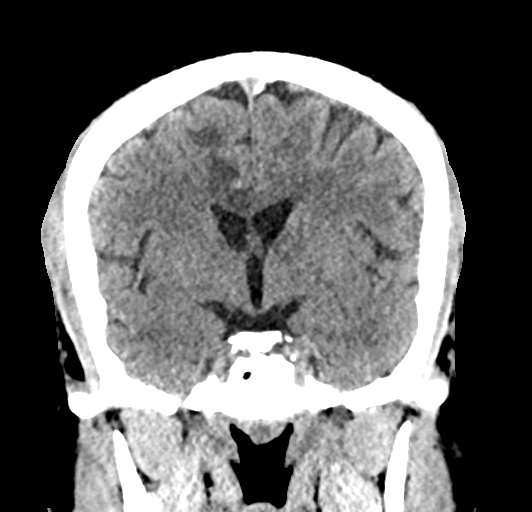

[Series 6: sag soft · sagittal · 0.35mm/px · 3 of 60 slices shown]
[im 21/60  brain]
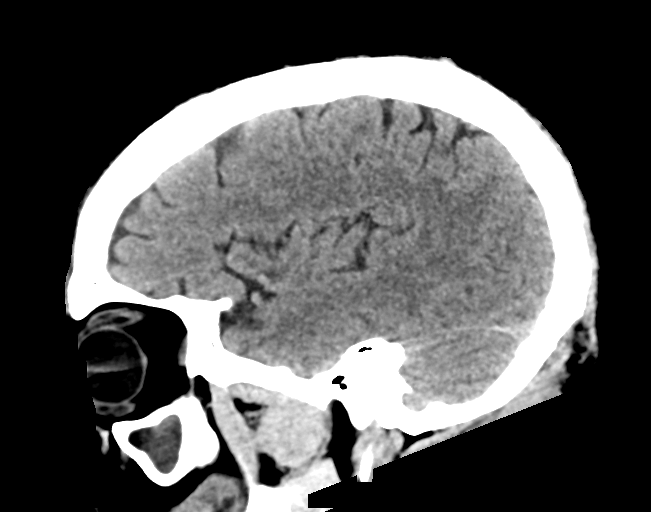
[im 30/60  brain]
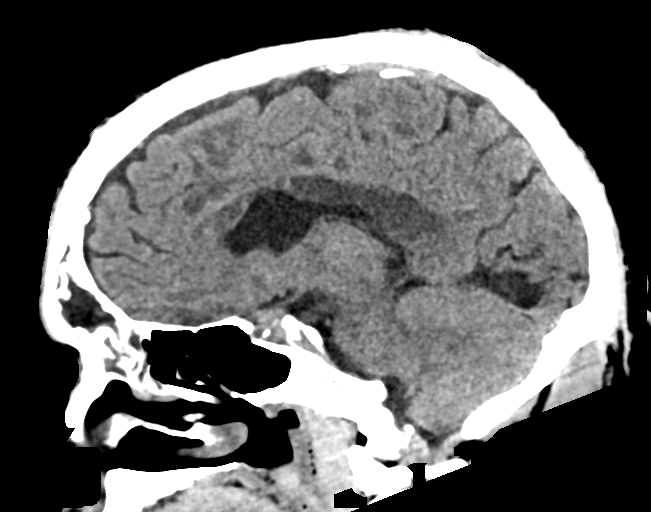
[im 39/60  brain]
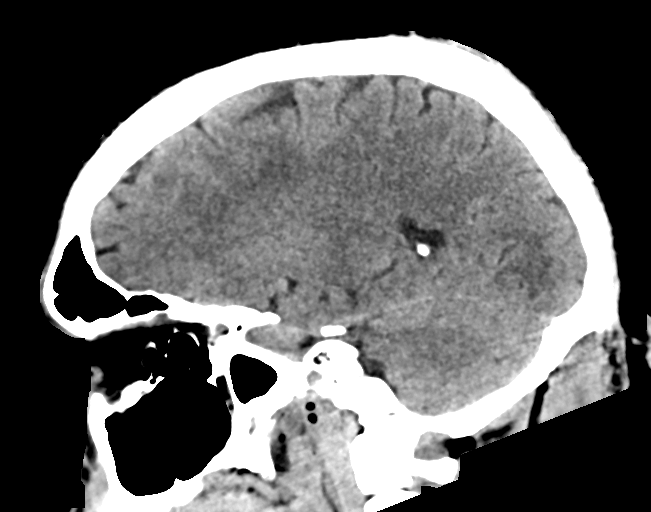

[16 of 47 positions shown; findings below may reference images not displayed]

FINDINGS: Brain: A subacute/early chronic right ACA territory infarct is
noted. There is some volume loss. No associated hemorrhage is
present. A more remote medial and inferior right occipital lobe
infarct is present. Ischemic changes within the right internal
capsule appear remote as well. No acute cortical infarct,
hemorrhage, or mass lesion is present. The basal ganglia are
otherwise intact. The insular ribbon is normal. No other focal
cortical lesions are present.

The ventricles are of normal size. No significant extra-axial fluid
collection is present.

Vascular: No hyperdense vessel or unexpected calcification.

Skull: The calvarium is intact. No focal lytic or blastic lesions
are present.

Sinuses/Orbits: The right maxillary sinus is opacified. The
ostiomeatal complex is occluded. Anterior right ethmoid and frontal
sinus opacification is noted as well. The left paranasal sinuses and
bilateral sphenoid sinuses are clear. The mastoid air cells are
clear.

ASPECTS (Alberta Stroke Program Early CT Score)

- Ganglionic level infarction (caudate, lentiform nuclei, internal
capsule, insula, M1-M3 cortex): [DATE]

- Supraganglionic infarction (M4-M6 cortex): [DATE]

Total score (0-10 with 10 being normal): [DATE]
IMPRESSION: 1. Subacute to early chronic right ACA territory infarcts without
hemorrhage. There is some volume loss.
2. More remote infarct in the inferior medial right occipital lobe.
3. Remote infarct of the right internal capsule.
4. No acute infarct.
5. Extensive anterior right-sided sinus disease
6. ASPECTS is [DATE]
The These results were called by telephone at the time of
interpretation on 02/08/2017 at [DATE] to Dr. DESIR, who verbally
acknowledged these results.

## 2018-03-06 IMAGING — CT CT ANGIO HEAD
1 of 8 series · 6 of 33 positions shown · IV contrast (APPLIED)
Comparison: Prior CT from earlier same day.

CLINICAL DATA: Initial evaluation for acute discs are.

EXAM:
CT ANGIOGRAPHY HEAD AND NECK
TECHNIQUE: Multidetector CT imaging of the head and neck was performed using
the standard protocol during bolus administration of intravenous
contrast. Multiplanar CT image reconstructions and MIPs were
obtained to evaluate the vascular anatomy. Carotid stenosis
measurements (when applicable) are obtained utilizing NASCET
criteria, using the distal internal carotid diameter as the
denominator.
CONTRAST:  50 cc of Isovue 370.

[Series 7: ax thins · axial · 0.39mm/px · z∈[-331,-68]mm · 6 of 369 slices shown]
[im 53/369  soft-tissue]
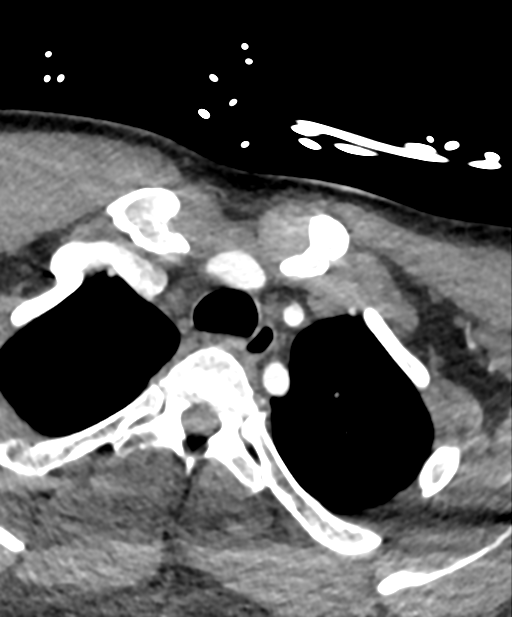
[im 106/369  bone]
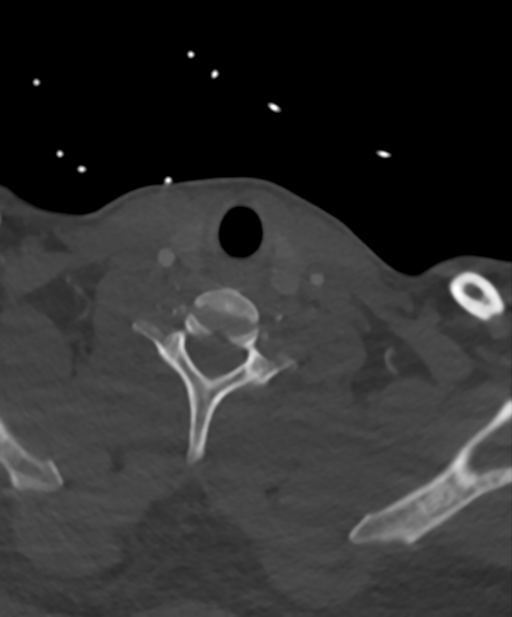
[im 158/369  soft-tissue]
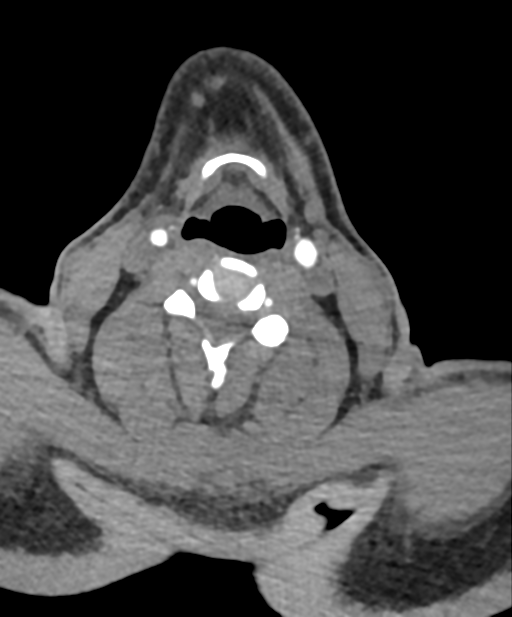
[im 211/369  bone]
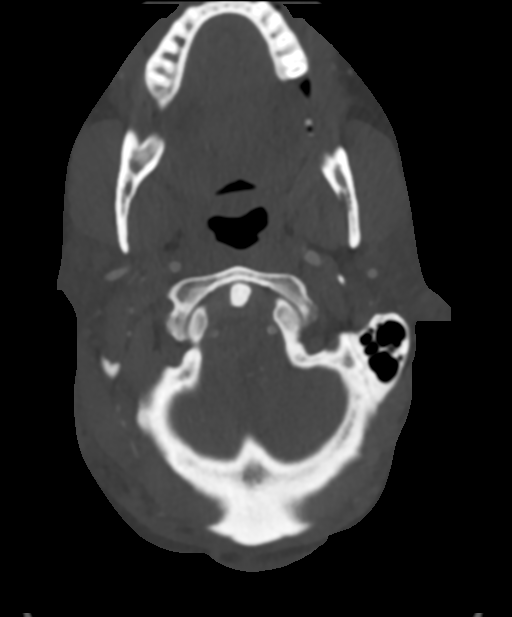
[im 263/369  soft-tissue]
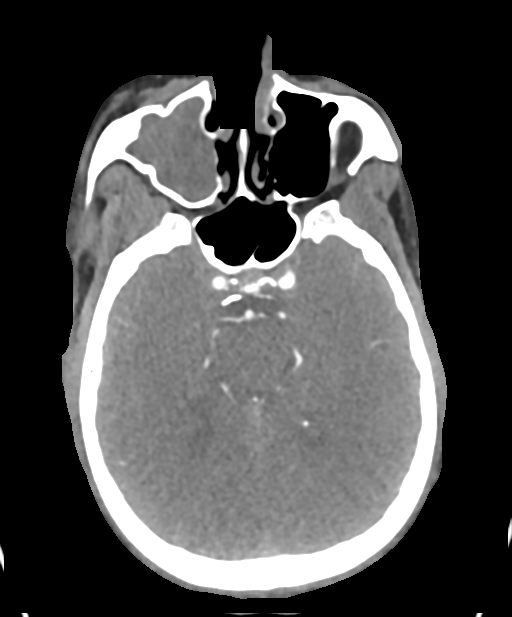
[im 316/369  bone]
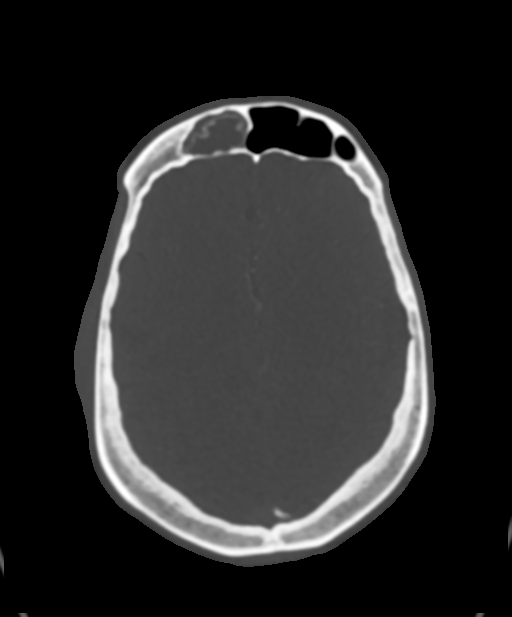

[6 of 33 positions shown; findings below may reference images not displayed]

FINDINGS: CTA NECK FINDINGS

Aortic arch: Visualized aortic arch of normal caliber with normal 3
vessel morphology. No flow-limiting stenosis about the origin of the
great vessels. Minimal plaque noted within the proximal descending
intrathoracic aorta. Visualized subclavian artery is widely patent.

Right carotid system: Right common and internal carotid artery's
widely patent without stenosis, dissection, or occlusion.

Left carotid system: Left common and internal carotid artery's
widely patent without stenosis, dissection, or occlusion.

Vertebral arteries: Both of the vertebral arteries arise from the
subclavian arteries. Left vertebral artery slightly dominant.
Vertebral arteries widely patent within the neck without stenosis,
dissection, or occlusion.

Skeleton: No acute osseus abnormality. No worrisome lytic or blastic
osseous lesions.

Other neck: No acute soft tissue abnormality within the neck.
Salivary glands normal. Thyroid normal. No adenopathy.

Upper chest: Visualized upper chest within normal limits. Partially
visualized lungs are clear.

Review of the MIP images confirms the above findings

CTA HEAD FINDINGS

Anterior circulation: Petrous, cavernous, and supraclinoid segments
of the internal carotid arteries are widely patent without
flow-limiting stenosis. ICA termini widely patent. A1 segments
patent bilaterally. Anterior communicating artery normal. Multifocal
irregularity within the A2 segments bilaterally with moderate to
severe stenoses (Series 7, image 75, 72), likely atheromatous. ACA
is are patent to their distal aspects. M1 segments patent
bilaterally without stenosis or occlusion MCA bifurcations normal.
No proximal M2 occlusion. Distal MCA branches demonstrate
atheromatous irregularity but are patent and symmetric bilaterally.

Posterior circulation: Focal plaque with severe left V4 stenosis
(series 8, image 123). Left vertebral artery patent distally to the
vertebrobasilar junction. Focal severe stenosis within the
diminutive right vertebral artery (series 8, image 123). Right
vertebral artery otherwise patent to the vertebrobasilar junction.
Posterior inferior cerebral arteries not well evaluated on this
exam. Vertebrobasilar junction normal. Basilar artery widely patent
to its distal aspect. Superior cerebral arteries patent bilaterally.
Posterior cerebral arteries largely supplied via the basilar artery.
Short-segment moderate mid left P2 stenosis (series 8, image 123).
Left PCA irregular but patent 2 its distal aspects. Right PCA
somewhat attenuated and irregular with moderate to severe multifocal
stenoses involving the right P2 segment (series 8, image 128). Right
PCA also patent to its distal aspect. Small left posterior
communicating artery noted.

Venous sinuses: Not well evaluated on this exam due to arterial
bolus timing.

Anatomic variants: None.  No aneurysm or vascular malformation.

Delayed phase: No pathologic enhancement. Subacute to chronic right
ACA territory infarcts again noted. Additional remote infarcts
involving the right PCA territory. Remote right basal ganglia
infarcts. Right maxillary sinus disease.

Review of the MIP images confirms the above findings
IMPRESSION: 1. Negative CTA for emergent large vessel occlusion.
2. Bilateral severe V4 stenoses as above.
3. Multifocal moderate to severe atheromatous is stenoses involving
the bilateral A2 and P2 segments as above, most severe within the
right PCA.
4. Negative CTA of the neck without flow-limiting stenosis or
significant atheromatous disease.

## 2019-04-23 ENCOUNTER — Other Ambulatory Visit: Payer: Self-pay

## 2019-04-23 ENCOUNTER — Encounter (HOSPITAL_COMMUNITY): Payer: Self-pay | Admitting: Emergency Medicine

## 2019-04-23 ENCOUNTER — Emergency Department (HOSPITAL_COMMUNITY)
Admission: EM | Admit: 2019-04-23 | Discharge: 2019-05-01 | Disposition: A | Discharge status: Home / Self Care | Payer: BC Managed Care – PPO | Attending: Emergency Medicine | Admitting: Emergency Medicine

## 2019-04-23 DIAGNOSIS — Z79899 Other long term (current) drug therapy: Secondary | ICD-10-CM | POA: Diagnosis not present

## 2019-04-23 DIAGNOSIS — I69354 Hemiplegia and hemiparesis following cerebral infarction affecting left non-dominant side: Secondary | ICD-10-CM | POA: Diagnosis not present

## 2019-04-23 DIAGNOSIS — N183 Chronic kidney disease, stage 3 unspecified: Secondary | ICD-10-CM | POA: Diagnosis not present

## 2019-04-23 DIAGNOSIS — E1122 Type 2 diabetes mellitus with diabetic chronic kidney disease: Secondary | ICD-10-CM | POA: Insufficient documentation

## 2019-04-23 DIAGNOSIS — R296 Repeated falls: Secondary | ICD-10-CM | POA: Diagnosis present

## 2019-04-23 DIAGNOSIS — Z794 Long term (current) use of insulin: Secondary | ICD-10-CM | POA: Diagnosis not present

## 2019-04-23 DIAGNOSIS — Z7901 Long term (current) use of anticoagulants: Secondary | ICD-10-CM | POA: Diagnosis not present

## 2019-04-23 DIAGNOSIS — I69390 Apraxia following cerebral infarction: Secondary | ICD-10-CM | POA: Insufficient documentation

## 2019-04-23 DIAGNOSIS — R8290 Unspecified abnormal findings in urine: Secondary | ICD-10-CM | POA: Diagnosis not present

## 2019-04-23 DIAGNOSIS — W19XXXA Unspecified fall, initial encounter: Secondary | ICD-10-CM

## 2019-04-23 DIAGNOSIS — I129 Hypertensive chronic kidney disease with stage 1 through stage 4 chronic kidney disease, or unspecified chronic kidney disease: Secondary | ICD-10-CM | POA: Insufficient documentation

## 2019-04-23 DIAGNOSIS — Z20828 Contact with and (suspected) exposure to other viral communicable diseases: Secondary | ICD-10-CM | POA: Insufficient documentation

## 2019-04-23 LAB — URINALYSIS, ROUTINE W REFLEX MICROSCOPIC
Bilirubin Urine: NEGATIVE
Glucose, UA: NEGATIVE mg/dL
Hgb urine dipstick: NEGATIVE
Ketones, ur: NEGATIVE mg/dL
Leukocytes,Ua: NEGATIVE
Nitrite: NEGATIVE
Protein, ur: >=300 mg/dL — AB
Specific Gravity, Urine: 1.022 (ref 1.005–1.030)
pH: 5 (ref 5.0–8.0)

## 2019-04-23 LAB — CBC WITH DIFFERENTIAL/PLATELET
Abs Immature Granulocytes: 0.01 10*3/uL (ref 0.00–0.07)
Basophils Absolute: 0 10*3/uL (ref 0.0–0.1)
Basophils Relative: 1 %
Eosinophils Absolute: 0.1 10*3/uL (ref 0.0–0.5)
Eosinophils Relative: 1 %
HCT: 35.6 % — ABNORMAL LOW (ref 39.0–52.0)
Hemoglobin: 11.6 g/dL — ABNORMAL LOW (ref 13.0–17.0)
Immature Granulocytes: 0 %
Lymphocytes Relative: 19 %
Lymphs Abs: 1.2 10*3/uL (ref 0.7–4.0)
MCH: 28.8 pg (ref 26.0–34.0)
MCHC: 32.6 g/dL (ref 30.0–36.0)
MCV: 88.3 fL (ref 80.0–100.0)
Monocytes Absolute: 0.4 10*3/uL (ref 0.1–1.0)
Monocytes Relative: 6 %
Neutro Abs: 4.4 10*3/uL (ref 1.7–7.7)
Neutrophils Relative %: 73 %
Platelets: 273 10*3/uL (ref 150–400)
RBC: 4.03 MIL/uL — ABNORMAL LOW (ref 4.22–5.81)
RDW: 13.3 % (ref 11.5–15.5)
WBC: 6.1 10*3/uL (ref 4.0–10.5)
nRBC: 0 % (ref 0.0–0.2)

## 2019-04-23 LAB — BASIC METABOLIC PANEL
Anion gap: 9 (ref 5–15)
BUN: 22 mg/dL — ABNORMAL HIGH (ref 6–20)
CO2: 25 mmol/L (ref 22–32)
Calcium: 8.5 mg/dL — ABNORMAL LOW (ref 8.9–10.3)
Chloride: 106 mmol/L (ref 98–111)
Creatinine, Ser: 1.88 mg/dL — ABNORMAL HIGH (ref 0.61–1.24)
GFR calc Af Amer: 47 mL/min — ABNORMAL LOW (ref >60)
GFR calc non Af Amer: 41 mL/min — ABNORMAL LOW (ref >60)
Glucose, Bld: 144 mg/dL — ABNORMAL HIGH (ref 70–99)
Potassium: 4.2 mmol/L (ref 3.5–5.1)
Sodium: 140 mmol/L (ref 135–145)

## 2019-04-23 MED ORDER — CLOPIDOGREL BISULFATE 75 MG PO TABS
75.0000 mg | ORAL_TABLET | Freq: Every day | ORAL | Status: DC
  Administered 2019-04-23: 75 mg via ORAL
  Filled 2019-04-23 (×3): qty 1

## 2019-04-23 MED ORDER — ESCITALOPRAM OXALATE 10 MG PO TABS
10.0000 mg | ORAL_TABLET | Freq: Every day | ORAL | Status: DC
  Administered 2019-04-23 – 2019-04-30 (×8): 10 mg via ORAL
  Filled 2019-04-23 (×8): qty 1

## 2019-04-23 MED ORDER — ATORVASTATIN CALCIUM 80 MG PO TABS
80.0000 mg | ORAL_TABLET | Freq: Every day | ORAL | Status: DC
  Administered 2019-04-23 – 2019-04-30 (×8): 80 mg via ORAL
  Filled 2019-04-23 (×8): qty 1

## 2019-04-23 MED ORDER — HYDRALAZINE HCL 25 MG PO TABS
50.0000 mg | ORAL_TABLET | Freq: Three times a day (TID) | ORAL | Status: DC
  Administered 2019-04-23 – 2019-04-28 (×15): 50 mg via ORAL
  Filled 2019-04-23 (×15): qty 2

## 2019-04-23 MED ORDER — ARIPIPRAZOLE 5 MG PO TABS
5.0000 mg | ORAL_TABLET | Freq: Every day | ORAL | Status: DC
  Administered 2019-04-23 – 2019-04-30 (×8): 5 mg via ORAL
  Filled 2019-04-23 (×9): qty 1

## 2019-04-23 MED ORDER — SODIUM CHLORIDE 0.9 % IV BOLUS
1000.0000 mL | Freq: Once | INTRAVENOUS | Status: AC
  Administered 2019-04-23: 1000 mL via INTRAVENOUS

## 2019-04-23 MED ORDER — AMLODIPINE BESYLATE 5 MG PO TABS
10.0000 mg | ORAL_TABLET | Freq: Every day | ORAL | Status: DC
  Administered 2019-04-23 – 2019-05-01 (×8): 10 mg via ORAL
  Filled 2019-04-23 (×10): qty 2

## 2019-04-23 NOTE — ED Notes (Signed)
[place] (Daughter)

## 2019-04-23 NOTE — TOC Initial Note (Signed)
Transition of Care [facility]) - Initial/Assessment Note    Patient Details  Name: Frank Moss MRN: [id number] Date of Birth: 07-08-1971  Transition of Care [facility]) CM/SW Contact:    [place], LCSW Phone Number: 04/23/2019, 9:04 PM  Clinical Narrative:  CSW at bedside to address consult regarding possible placement. CSW informed pt that the phsycial evaluation indicated a need for SNF/short term rehab. CSW educated pt on what short term rehab could look like for him.   CSW conducted [facility] assessment to gain more insight to pts SW/CM needs. Pt explains that he has minimal family involvement. Pt reports living an independent senior living community. Pt goes into detail and explains that he receives much assistance with his ADLs such as bathing and dressing. Pt reports being able to feed himself independently. Pt states that he ambulates primarily using a wheelchair.   Pt is agreeable to SNF placement. Pt states that he has a guardian that is also involved, Brandt Loosen [place]: [phone].   TOC team will continue to assist pt with placement needs.               Expected Discharge Plan: Skilled Nursing Facility Barriers to Discharge: Continued Medical Work up, SNF Pending bed offer   Patient Goals and CMS Choice Patient states their goals for this hospitalization and ongoing recovery are:: to discharge to SNF for short term rehab CMS Medicare.gov Compare Post Acute Care list provided to:: Patient Choice offered to / list presented to : Patient  Expected Discharge Plan and Services Expected Discharge Plan: [place] In-house Referral: Clinical Social Work   Post Acute Care Choice: [place] Living arrangements for the past 2 months: Independent Living Facility([facility])                                      Prior Living Arrangements/Services Living arrangements for the past 2 months: Independent Living Facility([facility]) Lives  with:: Self Patient language and need for interpreter reviewed:: Yes Do you feel safe going back to the place where you live?: Yes      Need for Family Participation in Patient Care: No (Comment) Care giver support system in place?: Yes (comment) Current home services: DME, Homehealth aide Criminal Activity/Legal Involvement Pertinent to Current Situation/Hospitalization: No - Comment as needed  Activities of Daily Living      Permission Sought/Granted Permission sought to share information with : Guardian, Customer service manager, Case Manager, Family Supports Permission granted to share information with : Yes, Verbal Permission Granted  Share Information with NAME: Brandt Loosen [place]) [place]: [phone]; Carney Harder (Social Worker) [place]: [phone]; Brice Kossman (Daughter) [place]: [phone]  Permission granted to share info w AGENCY: [place] granted to share info w Relationship: Gaurdian; Education officer, museum; Daughter     Emotional Assessment Appearance:: Appears stated age Attitude/Demeanor/Rapport: Gracious, Engaged Affect (typically observed): Calm, Accepting Orientation: : Oriented to Self, Oriented to Place, Oriented to  Time, Oriented to Situation Alcohol / Substance Use: Not Applicable Psych Involvement: No (comment)  Admission diagnosis:  POSSIBLE UTI Patient Active Problem List   Diagnosis Date Noted  . 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 Labile blood pressure   . Labile blood glucose   . 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 Diabetes mellitus type 2 in nonobese (HCC)   . Hypertensive crisis   .  Diabetes mellitus (HCC)   . Benign essential HTN   . Urinary retention   . Stage 3 chronic kidney disease   . Hyponatremia   . Hypoalbuminemia due to protein-calorie malnutrition (HCC)   . Acute blood loss anemia   . Acute thalamic infarction (HCC) 02/11/2017  . Spastic  hemiparesis of left nondominant side due to cerebrovascular disease (HCC) 02/10/2017  . Cognitive deficit due to recent stroke 02/10/2017  . Hemi-neglect of left side 02/10/2017  . CKD (chronic kidney disease), stage III 02/09/2017  . Dysphagia as late effect of stroke 02/08/2017  . Acute ischemic right ACA stroke (HCC) 01/31/2017  . Mixed hyperlipidemia 11/17/2007  . Accelerated hypertension 11/17/2007  . DM (diabetes mellitus), type 2 with peripheral vascular complications (HCC) 10/09/2007  . ERECTILE DYSFUNCTION, MILD 10/09/2007  . SHOULDER PAIN, LEFT 10/09/2007  . COLONIC POLYPS, HX OF 10/09/2007   PCP:  Patient, No Pcp Per Pharmacy:   [facility] - [place], [place] - [address] [place] [address] Phone: [phone] Fax: [phone]     Social Determinants of Health (SDOH) Interventions    Readmission Risk Interventions No flowsheet data found.

## 2019-04-23 NOTE — NC FL2 (Signed)
[place] LEVEL OF CARE SCREENING TOOL     IDENTIFICATION  Patient Name: Frank Moss Birthdate: 04/16/1971 Sex: male Admission Date (Current Location): 04/23/2019  [facility] and [place] Number:  Herbalist and Address:  [facility] [place], [place] [address] [place], [place] [address]      Provider Number: [id number]  Attending Physician Name and Address:  Default, Provider, MD  Relative Name and Phone Number:  Carney Harder [facility]: [phone]    Current Level of Care: Hospital Recommended Level of Care: [place] Prior Approval Number:    Date Approved/Denied: 01/25/17 PASRR Number: [id number]  Discharge Plan: SNF    Current Diagnoses: Patient Active Problem List   Diagnosis Date Noted  . 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 Labile blood pressure   . Labile blood glucose   . 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 Diabetes mellitus type 2 in nonobese (HCC)   . Hypertensive crisis   . Diabetes mellitus ([place])   . Benign essential HTN   . Urinary retention   . Stage 3 chronic kidney disease   . Hyponatremia   . Hypoalbuminemia due to protein-calorie malnutrition ([place])   . Acute blood loss anemia   . Acute thalamic infarction ([place]) 02/11/2017  . 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 ([place]) 02/10/2017  . Cognitive deficit due to recent stroke 02/10/2017  . Hemi-neglect of left side 02/10/2017  . CKD (chronic kidney disease), stage III 02/09/2017  . Dysphagia as late effect of stroke 02/08/2017  . Acute ischemic right ACA stroke ([place]) 01/31/2017  . Mixed hyperlipidemia 11/17/2007  . Accelerated hypertension 11/17/2007  . DM (diabetes mellitus), type 2 with peripheral vascular complications ([place]) 28/36/6294  . ERECTILE DYSFUNCTION, MILD 10/09/2007  . SHOULDER PAIN, LEFT 10/09/2007  . COLONIC POLYPS, HX OF  10/09/2007    Orientation RESPIRATION BLADDER Height & Weight     Self, Time, Situation, Place  Normal Continent Weight: 220 lb (99.8 kg) Height:  5\' 10"  (177.8 cm)  BEHAVIORAL SYMPTOMS/MOOD NEUROLOGICAL BOWEL NUTRITION STATUS      Continent Diet  AMBULATORY STATUS COMMUNICATION OF NEEDS Skin   Extensive Assist Verbally Normal                       Personal Care Assistance Level of Assistance  Bathing, Feeding, Dressing Bathing Assistance: Maximum assistance Feeding assistance: Independent Dressing Assistance: Maximum assistance     Functional Limitations Info  Hearing, Sight, Speech Sight Info: Impaired Hearing Info: Adequate Speech Info: Adequate    SPECIAL CARE FACTORS FREQUENCY  PT (By licensed PT), OT (By licensed OT)     PT Frequency: 3x weekly OT Frequency: 3x weekly            Contractures Contractures Info: Not present    Additional Factors Info  Code Status, Allergies, Psychotropic Code Status Info: Full Code Allergies Info: Beta Adrenergic Blockers Psychotropic Info: ARIPiprazole (ABILIFY) 5 MG tablet,escitalopram (LEXAPRO) 10 MG tablet         Current Medications (04/23/2019):  This is the current hospital active medication list Current Facility-Administered Medications  Medication Dose Route Frequency Provider Last Rate Last Dose  . amLODipine (NORVASC) tablet 10 mg  10 mg Oral Daily Janeece Fitting, PA-C   10 mg at 04/23/19 2005  . ARIPiprazole (ABILIFY) tablet 5 mg  5 mg Oral Daily Soto, Johana, PA-C   5 mg  at 04/23/19 2136  . atorvastatin (LIPITOR) tablet 80 mg  80 mg Oral QHS Claude Manges, PA-C   80 mg at 04/23/19 2136  . clopidogrel (PLAVIX) tablet 75 mg  75 mg Oral Daily Claude Manges, PA-C   75 mg at 04/23/19 2006  . escitalopram (LEXAPRO) tablet 10 mg  10 mg Oral Daily Claude Manges, PA-C   10 mg at 04/23/19 2006  . hydrALAZINE (APRESOLINE) tablet 50 mg  50 mg Oral Q8H Soto, Johana, PA-C   50 mg at 04/23/19 2136   Current Outpatient  Medications  Medication Sig Dispense Refill  . amLODipine (NORVASC) 10 MG tablet Take 1 tablet (10 mg total) by mouth daily. 30 tablet 0  . ARIPiprazole (ABILIFY) 5 MG tablet Take 5 mg by mouth daily.    Marland Kitchen atorvastatin (LIPITOR) 80 MG tablet Take 1 tablet (80 mg total) by mouth at bedtime. 30 tablet 0  . clopidogrel (PLAVIX) 75 MG tablet Take 1 tablet (75 mg total) by mouth daily. 30 tablet 0  . escitalopram (LEXAPRO) 10 MG tablet Take 1 tablet (10 mg total) by mouth daily. 30 tablet 0  . hydrALAZINE (APRESOLINE) 50 MG tablet Take 1 tablet (50 mg total) by mouth every 8 (eight) hours. 90 tablet 0  . Insulin Glargine (BASAGLAR KWIKPEN) 100 UNIT/ML SOPN Inject into the skin every morning.    . tamsulosin (FLOMAX) 0.4 MG CAPS capsule Take 1 capsule (0.4 mg total) by mouth at bedtime. 30 capsule 0  . tiZANidine (ZANAFLEX) 4 MG tablet Take 1 tablet (4 mg total) by mouth 3 (three) times daily. Please take 1 tablet twice a day and 2 tabs at bedtime 120 tablet 1     Discharge Medications: Please see discharge summary for a list of discharge medications.  Relevant Imaging Results:  Relevant Lab Results:   Additional Information SS#: [id number]  Osmar Howton Sherryle Lis, LCSW

## 2019-04-23 NOTE — ED Notes (Signed)
Pt legal guardian Frank Moss [phone]

## 2019-04-23 NOTE — Progress Notes (Signed)
Consult request has been received. CSW attempting to follow up at present time  Brayten Komar M. Taylin Leder LCSWA Transitions of Care  Clinical Social Worker  Ph: [phone] 

## 2019-04-23 NOTE — ED Triage Notes (Signed)
Per EMS- pt here from [facility]. Staff called EMS for eval of mechanical fall. Pt has no injuries and did not want transport. Staff called EMS back out for temp of 101. Urine possible w foul odor. Pt reports dec appetite since last week. H.o. left sided deficits.

## 2019-04-23 NOTE — ED Provider Notes (Signed)
[facility] EMERGENCY DEPARTMENT Provider Note   CSN: [id number] Arrival date & time: 04/23/19  1230     History   Chief Complaint Chief Complaint  Patient presents with  . Fall    HPI Clemence Stillings is a 48 y.o. male.     HPI   Ghassan Coggeshall is a 48 y.o. male, with a history of right ACA stroke, CAD, hyperlipidemia, presenting to the ED for evaluation following a slip and fall.   Patient states it is the policy of the facility for him to come to the hospital for evaluation following a fall. Per EMS, there was report of fever at the facility as well as foul-smelling urine. Denies head injury, LOC, neck/back pain, recent illness, urinary complaints, flank pain, N/V/D, abdominal pain, chest pain, shortness of breath, other pain or injuries, or any other complaints.    Past Medical History:  Diagnosis Date  . Abnormal ECG   . Acute ischemic right ACA stroke (HCC)   . Coronary artery disease, occlusive   . Dysfunction of eustachian tube   . History of right ACA stroke   . Hyperlipidemia associated with type 2 diabetes mellitus (HCC)   . Hypertension   . Impotence of organic origin   . Lumbago   . Lymphadenitis    Unspecified, except mesenteric  . Type 2 diabetes, uncontrolled, with retinopathy (HCC)   . Urinary retention 01/2017    Patient Active Problem List   Diagnosis Date Noted  . 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 Labile blood pressure   . Labile blood glucose   . 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 Diabetes mellitus type 2 in nonobese (HCC)   . Hypertensive crisis   . Diabetes mellitus (HCC)   . Benign essential HTN   . Urinary retention   . Stage 3 chronic kidney disease   . Hyponatremia   . Hypoalbuminemia due to protein-calorie malnutrition (HCC)   . Acute blood loss anemia   . Acute thalamic infarction (HCC) 02/11/2017  . Spastic hemiparesis of left  nondominant side due to cerebrovascular disease (HCC) 02/10/2017  . Cognitive deficit due to recent stroke 02/10/2017  . Hemi-neglect of left side 02/10/2017  . CKD (chronic kidney disease), stage III 02/09/2017  . Dysphagia as late effect of stroke 02/08/2017  . Acute ischemic right ACA stroke (HCC) 01/31/2017  . Mixed hyperlipidemia 11/17/2007  . Accelerated hypertension 11/17/2007  . DM (diabetes mellitus), type 2 with peripheral vascular complications (HCC) 10/09/2007  . ERECTILE DYSFUNCTION, MILD 10/09/2007  . SHOULDER PAIN, LEFT 10/09/2007  . COLONIC POLYPS, HX OF 10/09/2007    Past Surgical History:  Procedure Laterality Date  . TEE WITHOUT CARDIOVERSION N/A 02/11/2017   Procedure: TRANSESOPHAGEAL ECHOCARDIOGRAM (TEE);  Surgeon: Quintella Reichert, MD;  Location: [facility] OR;  Service: Cardiovascular;  Laterality: N/A;  . TEE WITHOUT CARDIOVERSION N/A 02/11/2017   Procedure: TRANSESOPHAGEAL ECHOCARDIOGRAM (TEE);  Surgeon: Quintella Reichert, MD;  Location: [facility] ENDOSCOPY;  Service: Cardiovascular;  Laterality: N/A;        Home Medications    Prior to Admission medications   Medication Sig Start Date End Date Taking? Authorizing Provider  amLODipine (NORVASC) 10 MG tablet Take 1 tablet (10 mg total) by mouth daily. 03/10/17   Angiulli, Mcarthur Rossetti, PA-C  ARIPiprazole (ABILIFY) 5 MG tablet Take 5 mg by mouth daily.    [provider]  atorvastatin (LIPITOR) 80 MG tablet Take 1 tablet (  80 mg total) by mouth at bedtime. 03/10/17   Angiulli, Lavon Paganini, PA-C  clopidogrel (PLAVIX) 75 MG tablet Take 1 tablet (75 mg total) by mouth daily. 03/10/17   Angiulli, Lavon Paganini, PA-C  escitalopram (LEXAPRO) 10 MG tablet Take 1 tablet (10 mg total) by mouth daily. 03/10/17   Angiulli, Lavon Paganini, PA-C  hydrALAZINE (APRESOLINE) 50 MG tablet Take 1 tablet (50 mg total) by mouth every 8 (eight) hours. 03/10/17   Angiulli, Lavon Paganini, PA-C  Insulin Glargine (BASAGLAR KWIKPEN) 100 UNIT/ML SOPN Inject into the skin  every morning.    [provider]  tamsulosin (FLOMAX) 0.4 MG CAPS capsule Take 1 capsule (0.4 mg total) by mouth at bedtime. 03/10/17   Angiulli, Lavon Paganini, PA-C  tiZANidine (ZANAFLEX) 4 MG tablet Take 1 tablet (4 mg total) by mouth 3 (three) times daily. Please take 1 tablet twice a day and 2 tabs at bedtime 05/03/17   Kirsteins, Luanna Salk, MD    Family History Family History  Problem Relation Age of Onset  . Hypertension Mother   . Depression Mother   . Cancer Neg Hx   . Diabetes Neg Hx   . Stroke Neg Hx     Social History Social History   Tobacco Use  . Smoking status: Never Smoker  . Smokeless tobacco: Never Used  Substance Use Topics  . Alcohol use: No  . Drug use: No     Allergies   Beta adrenergic blockers   Review of Systems Review of Systems  Constitutional: Negative for chills, diaphoresis and fever.  Eyes: Negative for visual disturbance.  Respiratory: Negative for cough and shortness of breath.   Cardiovascular: Negative for chest pain.  Gastrointestinal: Negative for abdominal pain, diarrhea, nausea and vomiting.  Genitourinary: Negative for dysuria, flank pain, frequency and hematuria.  Musculoskeletal: Negative for back pain and neck pain.  Neurological: Negative for dizziness, syncope, weakness, light-headedness, numbness and headaches.  All other systems reviewed and are negative.    Physical Exam Updated Vital Signs Pulse 77   Temp 98.6 F (37 C) (Oral)   Resp 18   Ht 5\' 10"  (1.778 m)   Wt 99.8 kg   SpO2 98%   BMI 31.57 kg/m   Physical Exam Vitals signs and nursing note reviewed.  Constitutional:      General: He is not in acute distress.    Appearance: He is well-developed. He is not diaphoretic.  HENT:     Head: Normocephalic and atraumatic.     Mouth/Throat:     Mouth: Mucous membranes are moist.     Pharynx: Oropharynx is clear.  Eyes:     Conjunctiva/sclera: Conjunctivae normal.  Neck:     Musculoskeletal: Neck  supple.  Cardiovascular:     Rate and Rhythm: Normal rate and regular rhythm.     Pulses: Normal pulses.          Radial pulses are 2+ on the right side and 2+ on the left side.       Posterior tibial pulses are 2+ on the right side and 2+ on the left side.     Heart sounds: Normal heart sounds.     Comments: Tactile temperature in the extremities appropriate and equal bilaterally. Pulmonary:     Effort: Pulmonary effort is normal. No respiratory distress.     Breath sounds: Normal breath sounds.  Abdominal:     Palpations: Abdomen is soft.     Tenderness: There is no abdominal tenderness. There is  no guarding.  Musculoskeletal:     Right lower leg: No edema.     Left lower leg: No edema.  Lymphadenopathy:     Cervical: No cervical adenopathy.  Skin:    General: Skin is warm and dry.  Neurological:     Mental Status: He is alert.     Comments: Sensation grossly intact to light touch in the extremities.  Grip strengths equal bilaterally.  Strength 5/5 right upper and lower extremity.  Strength 4/5 in the left upper and lower extremity, baseline for him due to previous stroke.  Coordination intact. Cranial nerves III-XII grossly intact. No facial droop.   Psychiatric:        Mood and Affect: Mood and affect normal.        Speech: Speech normal.        Behavior: Behavior normal.      ED Treatments / Results  Labs (all labs ordered are listed, but only abnormal results are displayed) Labs Reviewed  BASIC METABOLIC PANEL - Abnormal; Notable for the following components:      Result Value   Glucose, Bld 144 (*)    BUN 22 (*)    Creatinine, Ser 1.88 (*)    Calcium 8.5 (*)    GFR calc non Af Amer 41 (*)    GFR calc Af Amer 47 (*)    All other components within normal limits  CBC WITH DIFFERENTIAL/PLATELET - Abnormal; Notable for the following components:   RBC 4.03 (*)    Hemoglobin 11.6 (*)    HCT 35.6 (*)    All other components within normal limits  URINALYSIS, ROUTINE W  REFLEX MICROSCOPIC - Abnormal; Notable for the following components:   APPearance HAZY (*)    Protein, ur >=300 (*)    Bacteria, UA RARE (*)    All other components within normal limits  URINE CULTURE    EKG None  Radiology No results found.  Procedures Procedures (including critical care time)  Medications Ordered in ED Medications  sodium chloride 0.9 % bolus 1,000 mL (has no administration in time range)     Initial Impression / Assessment and Plan / ED Course  I have reviewed the triage vital signs and the nursing notes.  Pertinent labs & imaging results that were available during my care of the patient were reviewed by me and considered in my medical decision making (see chart for details).  Clinical Course as of Apr 22 1729  Mon Apr 23, 2019  1413 Spoke with Jennette Kettleendra Marcus, social worker, [place] [facility], Guardians for patient.  Patient is currently in independent living.  They are concerned for frequent falls and are inquiring about possible SNF placement.  Larey SeatFell 5 times in August and then has had intermittent falls over the last month. They are concerned he can no longer care for himself.  Has FL-2 form from his PCP, Dr. Docia ChuckKoirala. She is currently searching for placement a SNF, but is being told that due to COVID, many nursing facilities are only accepting patients from the hospital.   [SJ]    Clinical Course User Index [SJ] ,  C, PA-C       Patient presents for evaluation following a fall.  No acute findings to suggest injury.  He does have an elevation in his creatinine, may be due to dehydration. Lab work otherwise overall reassuring. There is also a social component to this patient's visit.  Patient's guardians are seeking SNF placement for the patient and would  appreciate any assistance we can offer on this front.   End of shift patient care handoff report given to Claude Manges, PA-C. Plan: Follow-up with social work on plan for discharge of this  patient.  If admission is needed, he may be admitted at least for observation and management of AKI.  Final Clinical Impressions(s) / ED Diagnoses   Final diagnoses:  None    ED Discharge Orders    None       Concepcion Living 04/23/19 1731    Pricilla Loveless, MD 04/24/19 [phone]

## 2019-04-23 NOTE — ED Notes (Signed)
Social worker, Carney Harder called requesting assistance with getting pt placed into a skilled nursing facility due to increase number of falls. [phone]

## 2019-04-23 NOTE — ED Provider Notes (Signed)
  Physical Exam  BP (!) 156/85   Pulse 63   Temp 98.6 F (37 C) (Oral)   Resp (!) 27   Ht 5\' 10"  (1.778 m)   Wt 99.8 kg   SpO2 99%   BMI 31.57 kg/m   Physical Exam  ED Course/Procedures   Clinical Course as of Apr 22 2052  Mon Apr 23, 2019  1413 Spoke with Carney Harder, social worker, [place], Guardians for patient.  Patient is currently in independent living.  They are concerned for frequent falls and are inquiring about possible SNF placement.  [place] 5 times in August and then has had intermittent falls over the last month. They are concerned he can no longer care for himself.  Has FL-2 form from his PCP, Dr. Dorthy Cooler. She is currently searching for placement a SNF, but is being told that due to [place], many nursing facilities are only accepting patients from the hospital.   [SJ]    Clinical Course User Index [SJ] Joy, Shawn C, PA-C    Procedures  MDM  Patient care assumed from [place] PA at shift change, please see his note for a full HPI. Briefly, patient with a PMH of T2M. HTN, hyperlipidemia presents tot he ED with increase in falls. Patient singed out pending UA, was found to be febrile at 100.1 possible UTI. He is currently living in a independent living facility. Will attempt to ambulate patient. I have personally spoken to [place] work in order to help with finding patient placement. He will be evaluated by PT which will determined his disposition.   UA resulted with 0 to white blood cells, rare bacteria.  A urine culture was also sent.  Patient was evaluated by PT, determined the patient was not safe to ambulate, needed assistance.  I have spoken to social work Mariann Laster, she reports we are currently trying to find placement for patient.  Patient will likely be placed into a SNF, I have ordered his COVID-19 testing along with his home medications.  8:54 PM patient was continually monitored by me, reports no pain at this time, blood pressure was slightly  elevated, will provide his blood pressure medication at this time.  Unfortunately, due to delay in placing will have patient boarded in the ED.    Portions of this note were generated with Lobbyist. Dictation errors may occur despite best attempts at proofreading.         Janeece Fitting, PA-C 04/23/19 2055    Deno Etienne, DO 04/23/19 2133

## 2019-04-23 NOTE — Evaluation (Signed)
Physical Therapy Evaluation Patient Details Name: Frank Moss MRN: [id number] DOB: September 13, 1970 Today's Date: 04/23/2019   History of Present Illness  Pt is a 48 y/o male presenting to ED following falls. Per notes, concern for inability to care for himself. PMH includes CAD, HTN, DM, and CKD.   Clinical Impression  Pt presenting with problem above and deficits below. Pt requiring mod A for basic bed mobility and to maintain sitting balance at EOB. Pt with flailing type motions of LUE and trunk during bed mobility. Pt also presenting with cognitive deficits, however, unsure of baseline. Per notes, pt with increased falls, however, pt reports he has not had any falls. Unsure of caregiver support at home. Feel pt would benefit from SNF level therapy at d/c to increase independence and safety with mobility. Will continue to follow acutely.     Follow Up Recommendations SNF;Supervision/Assistance - 24 hour    Equipment Recommendations  None recommended by PT    Recommendations for Other Services       Precautions / Restrictions Precautions Precautions: Fall Precaution Comments: Pt reports he has not fallen, however, per notes, has had multiple falls.  Restrictions Weight Bearing Restrictions: No      Mobility  Bed Mobility Overal bed mobility: Needs Assistance Bed Mobility: Supine to Sit;Sit to Supine     Supine to sit: Mod assist Sit to supine: Mod assist   General bed mobility comments: Pt with legs over stretcher rail initially upon entry. Required cues to place LEs back to bed. Mod A for trunk elevation and LE assist to sit at EOB to attempt use of urinal. Pt with flailing type motions of LUE and trunk and required mod A for sitting balance. Mod A for return to supine.   Transfers                 General transfer comment: Unsafe to attempt with +1 assist.   Ambulation/Gait                Stairs            Wheelchair Mobility    Modified Rankin  (Stroke Patients Only)       Balance Overall balance assessment: Needs assistance Sitting-balance support: Bilateral upper extremity supported;Feet supported Sitting balance-Leahy Scale: Poor Sitting balance - Comments: Reliant on Mod A to maintain sitting balance.                                      Pertinent Vitals/Pain Pain Assessment: No/denies pain    Home Living Family/patient expects to be discharged to:: Private residence Living Arrangements: Alone Available Help at Discharge: Available PRN/intermittently;Personal care attendant Type of Home: Independent living facility         Home Equipment: Gilford Rile - 2 wheels;Wheelchair - manual      Prior Function Level of Independence: Needs assistance   Gait / Transfers Assistance Needed: Pt reports he uses WC mainly but will sometimes use RW. Unsure of accuracy given cognitive deficits and reported hx of falls.   ADL's / Homemaking Assistance Needed: Reports a nurse comes in to help with medication and an aide comes in to help with bathing. Unsure of accuracy.         Hand Dominance        Extremity/Trunk Assessment   Upper Extremity Assessment Upper Extremity Assessment: Generalized weakness;LUE deficits/detail LUE Deficits / Details: LUE seemed to flail  with mobility tasks.     Lower Extremity Assessment Lower Extremity Assessment: Generalized weakness    Cervical / Trunk Assessment Cervical / Trunk Assessment: Kyphotic  Communication   Communication: No difficulties  Cognition Arousal/Alertness: Awake/alert Behavior During Therapy: Restless Overall Cognitive Status: No family/caregiver present to determine baseline cognitive functioning                                 General Comments: Pt disoriented to time. Pt slow to respond and unsure of accuracy of home information.       General Comments      Exercises     Assessment/Plan    PT Assessment Patient needs  continued PT services  PT Problem List Decreased strength;Decreased balance;Decreased mobility;Decreased cognition;Decreased knowledge of use of DME;Decreased safety awareness;Decreased knowledge of precautions       PT Treatment Interventions DME instruction;Gait training;Functional mobility training;Therapeutic activities;Therapeutic exercise;Balance training;Patient/family education    PT Goals (Current goals can be found in the Care Plan section)  Acute Rehab PT Goals PT Goal Formulation: Patient unable to participate in goal setting Time For Goal Achievement: 05/07/19 Potential to Achieve Goals: Good    Frequency Min 2X/week   Barriers to discharge Decreased caregiver support      Co-evaluation               AM-PAC PT "6 Clicks" Mobility  Outcome Measure Help needed turning from your back to your side while in a flat bed without using bedrails?: A Lot Help needed moving from lying on your back to sitting on the side of a flat bed without using bedrails?: A Lot Help needed moving to and from a bed to a chair (including a wheelchair)?: A Lot Help needed standing up from a chair using your arms (e.g., wheelchair or bedside chair)?: A Lot Help needed to walk in hospital room?: A Lot Help needed climbing 3-5 steps with a railing? : Total 6 Click Score: 11    End of Session   Activity Tolerance: Patient tolerated treatment well Patient left: in bed;with call bell/phone within reach Nurse Communication: Mobility status PT Visit Diagnosis: Other abnormalities of gait and mobility (R26.89);Muscle weakness (generalized) (M62.81);History of falling (Z91.81);Repeated falls (R29.6)    Time: 0071-2197 PT Time Calculation (min) (ACUTE ONLY): 11 min   Charges:   PT Evaluation $PT Eval Moderate Complexity: 1 Mod          Gladys Damme, PT, DPT  Acute Rehabilitation Services  Pager: [phone] Office: [phone]   Lehman Prom 04/23/2019, 6:01  PM

## 2019-04-23 NOTE — Care Management (Signed)
PT evaluated patient and recommendation is SNF placement. CM discussed with CSW patient will need to be placed from ED, patient is not  Meeting criteria for inpatient admission. TOC with continue to follow for safe discharge.

## 2019-04-24 LAB — URINE CULTURE: Culture: NO GROWTH

## 2019-04-24 LAB — SARS CORONAVIRUS 2 (TAT 6-24 HRS): SARS Coronavirus 2: NEGATIVE

## 2019-04-24 MED ORDER — CLOPIDOGREL BISULFATE 75 MG PO TABS
75.0000 mg | ORAL_TABLET | Freq: Every day | ORAL | Status: DC
  Administered 2019-04-24 – 2019-04-30 (×7): 75 mg via ORAL
  Filled 2019-04-24 (×6): qty 1

## 2019-04-24 NOTE — ED Notes (Signed)
Pt arrived to Rm 48 via stretcher. Pt noted to be alert. Sitter arrived.

## 2019-04-24 NOTE — Progress Notes (Addendum)
10:30am: CSW spoke with Carney Harder who accepted the bed offer from [facility]. CSW notified Juliann Pulse in admissions of acceptance. Juliann Pulse will obtain insurance authorization for this patient.  9am: CSW spoke with patient's RN to determine his current orientation - RN reports patient is only oriented to himself. CSW spoke with the patient's home health agency social worker Brandt Loosen at [phone] who states she Carney Harder is the guardian of this patient.  CSW spoke with Carney Harder at [phone] who requested to do research on [place] and [employer] [facility]. CSW provided Dendra with website for research purposes - [employer]. Dendra also informed CSW that this patient has a long term Medicaid application pending, and that the application will be approved once the patient is placed into a SNF.  CSW spoke with Ebony Hail at [facility] stating the patient has a tentative bed offer pending insurance authorization. CSW will to assist with discharge planning.  Madilyn Fireman, MSW, LCSW-A Clinical Social Worker   Transitions of [place] Emergency Departments   Medical ICU [phone]

## 2019-04-24 NOTE — ED Notes (Signed)
Pt lying on stretcher w/head of stretcher elevated. Nad.

## 2019-04-24 NOTE — ED Notes (Signed)
bfast tray ordered 

## 2019-04-24 NOTE — Discharge Planning (Signed)
Clinical Social Work is seeking post-discharge placement for this patient at the following level of care: SNF [facility] [employer])

## 2019-04-24 NOTE — ED Notes (Signed)
Pt feeding himself lunch after tray set up for him.

## 2019-04-24 NOTE — ED Notes (Signed)
Pt eating bfast

## 2019-04-25 NOTE — Progress Notes (Addendum)
12pm: CSW spoke with Juliann Pulse who states BCBS has not given authorization for this patient yet.  10am: CSW spoke with Juliann Pulse at [facility] to discuss the insurance authorization for this patient, it has not yet been received but CSW will await for call. CSW spoke with Carney Harder, patient's legal guardian to provide her with an update.  Madilyn Fireman, MSW, LCSW-A Clinical Social Worker   Transitions of [place] Emergency Departments   Medical ICU [phone]

## 2019-04-25 NOTE — ED Notes (Signed)
Breakfast ordered 

## 2019-04-25 NOTE — ED Notes (Signed)
Meal tray ordered 

## 2019-04-26 NOTE — Progress Notes (Signed)
CSW received call from [place] at [facility] who reports authorization still has not been given for patient.   Golden Circle, LCSW Transitions of Care Department [facility] ED [phone]

## 2019-04-26 NOTE — Progress Notes (Signed)
Physical Therapy Treatment Patient Details Name: Frank Moss MRN: [id number] DOB: 20-May-1971 Today's Date: 04/26/2019    History of Present Illness Pt is a 48 y/o male presenting to ED following falls. Per notes, concern for inability to care for himself. PMH includes CAD, HTN, DM, and CKD.     PT Comments    Pt progressing towards goals. Pt stood with mod A and use of RW, however, was unable to take steps secondary to pain. LLE in external rotation and was unable to put L foot on the ground.  Performed supine RLE HEP, as pt refusing to perform on LLE secondary to pain. Current recommendations appropriate. Will continue to follow acutely to maximize functional mobility independence and safety.    Follow Up Recommendations  SNF;Supervision/Assistance - 24 hour     Equipment Recommendations  None recommended by PT    Recommendations for Other Services       Precautions / Restrictions Precautions Precautions: Fall Precaution Comments: Pt reports he has not fallen, however, per notes, has had multiple falls.  Restrictions Weight Bearing Restrictions: No    Mobility  Bed Mobility Overal bed mobility: Needs Assistance Bed Mobility: Supine to Sit;Sit to Supine     Supine to sit: Min assist Sit to supine: Min assist   General bed mobility comments: Min A for trunk elevation. Noted much more controlled movements to come to EOB this session. Required assist for LE to return to supine.   Transfers Overall transfer level: Needs assistance Equipment used: Rolling walker (2 wheeled) Transfers: Sit to/from Stand Sit to Stand: Mod assist         General transfer comment: Mod A for lift assist and steadying to stand. Pt with LLE in external rotation and unable to place L foot flat on floor, so further mobility deferred.   Ambulation/Gait                 Stairs             Wheelchair Mobility    Modified Rankin (Stroke Patients Only)       Balance Overall  balance assessment: Needs assistance Sitting-balance support: No upper extremity supported;Feet supported Sitting balance-Leahy Scale: Fair     Standing balance support: Bilateral upper extremity supported;During functional activity Standing balance-Leahy Scale: Poor Standing balance comment: Reliant on BUE support and external support.                             Cognition Arousal/Alertness: Awake/alert Behavior During Therapy: WFL for tasks assessed/performed Overall Cognitive Status: No family/caregiver present to determine baseline cognitive functioning                                 General Comments: Pt reported on eval he was walking and using WC, however, today reports he just "scooted" over to his WC.       Exercises General Exercises - Lower Extremity Ankle Circles/Pumps: AROM;Right;10 reps;Supine Heel Slides: AROM;Right;10 reps;Supine    General Comments General comments (skin integrity, edema, etc.): Pt refused to do HEP on LLE secondary to pain.       Pertinent Vitals/Pain Pain Assessment: No/denies pain    Home Living                      Prior Function            PT  Goals (current goals can now be found in the care plan section) Acute Rehab PT Goals Patient Stated Goal: none stated PT Goal Formulation: Patient unable to participate in goal setting Time For Goal Achievement: 05/07/19 Potential to Achieve Goals: Good Progress towards PT goals: Progressing toward goals    Frequency    Min 2X/week      PT Plan Current plan remains appropriate    Co-evaluation              AM-PAC PT "6 Clicks" Mobility   Outcome Measure  Help needed turning from your back to your side while in a flat bed without using bedrails?: A Little Help needed moving from lying on your back to sitting on the side of a flat bed without using bedrails?: A Little Help needed moving to and from a bed to a chair (including a wheelchair)?:  A Lot Help needed standing up from a chair using your arms (e.g., wheelchair or bedside chair)?: A Lot Help needed to walk in hospital room?: Total Help needed climbing 3-5 steps with a railing? : Total 6 Click Score: 12    End of Session Equipment Utilized During Treatment: Gait belt Activity Tolerance: Patient tolerated treatment well Patient left: in bed;with call bell/phone within reach Nurse Communication: Mobility status PT Visit Diagnosis: Other abnormalities of gait and mobility (R26.89);Muscle weakness (generalized) (M62.81);History of falling (Z91.81);Repeated falls (R29.6);Difficulty in walking, not elsewhere classified (R26.2)     Time: 1203-1218 PT Time Calculation (min) (ACUTE ONLY): 15 min  Charges:  $Therapeutic Activity: 8-22 mins                     Leighton Ruff, PT, DPT  Acute Rehabilitation Services  Pager: [phone] Office: [phone]    Rudean Hitt 04/26/2019, 4:42 PM

## 2019-04-26 NOTE — Discharge Planning (Signed)
Clinical Social Work is seeking post-discharge placement for this patient at the following level of care: SNF.    

## 2019-04-26 NOTE — ED Notes (Signed)
Breakfast Tray Ordered. 

## 2019-04-26 NOTE — ED Notes (Signed)
Pt.'s dinner tray delivered.

## 2019-04-27 NOTE — Progress Notes (Signed)
CSW called Juliann Pulse with [facility]. Still no insurance authorization for patient. CSW received call from patient's APS worker Carney Harder and provided her with an update.  Golden Circle, LCSW Transitions of Care Department [facility] ED [phone]

## 2019-04-27 NOTE — ED Notes (Signed)
Breakfast ordered 

## 2019-04-27 NOTE — ED Notes (Signed)
MD has been notified for 3 running stool within a hour and c/o stomach ache-Monique,RN

## 2019-04-28 MED ORDER — ENOXAPARIN SODIUM 40 MG/0.4ML ~~LOC~~ SOLN
40.0000 mg | Freq: Every day | SUBCUTANEOUS | Status: DC
  Administered 2019-04-28 – 2019-05-01 (×4): 40 mg via SUBCUTANEOUS
  Filled 2019-04-28 (×5): qty 0.4

## 2019-04-28 MED ORDER — HYDRALAZINE HCL 25 MG PO TABS
75.0000 mg | ORAL_TABLET | Freq: Three times a day (TID) | ORAL | Status: DC
  Administered 2019-04-28: 25 mg via ORAL
  Administered 2019-04-28 – 2019-05-01 (×9): 75 mg via ORAL
  Filled 2019-04-28 (×11): qty 3

## 2019-04-28 NOTE — ED Notes (Signed)
Regular Diet was ordered for Dinner. 

## 2019-04-28 NOTE — ED Notes (Signed)
Regular Diet was ordered for Lunch. 

## 2019-04-28 NOTE — Progress Notes (Signed)
CSW spoke with [employer] regarding [employer]. At this time authorization has not been approved. TOC leadership informed. Insurance authorization is the only currently identified TOC barrier to discharge.   Daphine Deutscher, LCSW, [place] Social Worker II Emergency Department, [place], [place] [phone]

## 2019-04-28 NOTE — ED Notes (Signed)
Dr Stark Jock aware pt c/o unable to void - bladder scan 372ml. Advised to wait a little while to see if pt can void on his own - if not and pt continues to complain - may in and out cath.

## 2019-04-29 NOTE — ED Notes (Signed)
Pt lying on bed w/eyes closed. Awakens easily. Encouraged pt to eat lunch - declines at this time.

## 2019-04-29 NOTE — ED Notes (Signed)
Patient was given a Snack and Drink for a snack. A Regular Diet was ordered for Lunch.

## 2019-04-30 NOTE — ED Triage Notes (Signed)
PT assisted with meal. Pt able to feed self after food is cut for him.

## 2019-04-30 NOTE — ED Notes (Signed)
Pt's lunch arrived 

## 2019-04-30 NOTE — Progress Notes (Addendum)
2pm: CSW received return call from Carney Harder, [place] informed her of the barriers that are being faced to SNF placement due to the patient's out of state BCBS plan. Dendra requested CSW send her the signed FL2 to submit to the Medicaid division for approval for long term care.  CSW attempted to reach [place] at [facility] without success, voicemail was left requesting a return call.  11:45am: CSW attempted to reach Carney Harder at [phone] without success, a voicemail was left requesting a return call.  10:20am: CSW spoke with [place] at [facility] [place] who reports this patient was receiving limited services from the agency including two daily staff escorts to get breakfast and lunch, one medication reminder, and one daily safety check.   9:40am: CSW spoke with Juliann Pulse at [facility] who states the facility is having difficulty obtaining insurance auth due to the patient lacking an appropriate diagnosis for SNF admission. The Josem Kaufmann has not been denied but it is taking excessively longer than normal.  CSW attempted to reach Judson Roch in admissions at [facility] to determine if this patient could return with [facility] services in place since he has been awaiting placement for six days.  CSW attempted to reach [facility] at [phone], [facility] agency SW without success, a voicemail was left requesting a return call.  7:40am: CSW spoke with Juliann Pulse at [facility] who states insurance authorization is still pending for this patient.  Madilyn Fireman, MSW, LCSW-A Transitions of Care  Clinical Social Worker  [facility] Emergency Departments  Medical ICU [phone]

## 2019-04-30 NOTE — ED Notes (Signed)
Pt's breakfast tray arrived 

## 2019-05-01 LAB — SARS CORONAVIRUS 2 BY RT PCR (HOSPITAL ORDER, PERFORMED IN ~~LOC~~ HOSPITAL LAB): SARS Coronavirus 2: NEGATIVE

## 2019-05-01 NOTE — ED Notes (Signed)
Ordered bfast 

## 2019-05-01 NOTE — ED Notes (Signed)
Lunch trey ordered

## 2019-05-01 NOTE — Progress Notes (Addendum)
12pm: CSW arranged for [facility]AR at 1:30 today. 11:15am: Patient's COVID has resulted and it is negative. RN faxed it to facility.  8am: CSW received notification from [place] at [facility] stating this patient's insurance authorization was approved. CSW spoke with Carney Harder, legal guardian to inform her of acceptance. Dendra provided CSW with legal guardianship paper, CSW will submit them to medical records.  CSW spoke with Nicki Reaper, RN to complete rapid COVID for patient for discharge.   Patient will go to room 110P at [facility]. The number to call for report is [phone].   Madilyn Fireman, MSW, LCSW-A Transitions of Care  Clinical Social Worker  [facility] Emergency Departments  Medical ICU [phone]

## 2019-05-01 NOTE — ED Notes (Signed)
PTAR arrived to transport patient. 

## 2024-05-03 ENCOUNTER — Other Ambulatory Visit: Payer: Self-pay

## 2024-05-03 ENCOUNTER — Ambulatory Visit (INDEPENDENT_AMBULATORY_CARE_PROVIDER_SITE_OTHER): Admitting: Internal Medicine

## 2024-05-03 ENCOUNTER — Encounter: Payer: Self-pay | Admitting: Internal Medicine

## 2024-05-03 VITALS — Temp 97.7°F

## 2024-05-03 DIAGNOSIS — Z79899 Other long term (current) drug therapy: Secondary | ICD-10-CM | POA: Diagnosis not present

## 2024-05-03 DIAGNOSIS — B999 Unspecified infectious disease: Secondary | ICD-10-CM | POA: Diagnosis not present

## 2024-05-03 DIAGNOSIS — L97329 Non-pressure chronic ulcer of left ankle with unspecified severity: Secondary | ICD-10-CM | POA: Diagnosis not present

## 2024-05-03 NOTE — Progress Notes (Signed)
 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) 02/10/2017   Cognitive deficit due to recent stroke 02/10/2017   Hemi-neglect of left side 02/10/2017   CKD (chronic kidney disease), stage III (HCC) 02/09/2017   Dysphagia as late effect of stroke 02/08/2017   Acute ischemic right ACA stroke (HCC) 01/31/2017   Mixed hyperlipidemia 11/17/2007   Accelerated hypertension 11/17/2007   DM (diabetes mellitus), type 2 with peripheral vascular complications (HCC) 10/09/2007   ERECTILE DYSFUNCTION, MILD 10/09/2007   SHOULDER PAIN, LEFT 10/09/2007   History of colonic polyps 10/09/2007    Patient's Medications  New Prescriptions   No medications on file  Previous Medications   AMLODIPINE  (NORVASC ) 10 MG TABLET    Take 1 tablet (10 mg total) by mouth daily.   ARIPIPRAZOLE  (ABILIFY ) 5 MG TABLET    Take 5 mg by mouth daily.   ATORVASTATIN  (LIPITOR ) 80 MG TABLET    Take 1 tablet (80 mg total) by mouth at bedtime.   BENZTROPINE (COGENTIN) 0.5 MG TABLET    Take 0.5 mg by mouth daily.   CLOPIDOGREL  (PLAVIX ) 75 MG TABLET    Take 1 tablet (75 mg total) by mouth daily.   ESCITALOPRAM  (LEXAPRO ) 10 MG TABLET    Take 1 tablet (10 mg total) by mouth daily.   FUROSEMIDE (LASIX) 20 MG TABLET    Take 20 mg by mouth every other day as needed for fluid.   HYDRALAZINE  (APRESOLINE ) 50 MG TABLET     Take 1 tablet (50 mg total) by mouth every 8 (eight) hours.   IRBESARTAN (AVAPRO) 75 MG TABLET    Take 75 mg by mouth daily.   NITROGLYCERIN (NITROSTAT) 0.4 MG SL TABLET    Place 0.4 mg under the tongue every 5 (five) minutes as needed for chest pain.   OMEPRAZOLE (PRILOSEC) 20 MG CAPSULE    Take 20 mg by mouth daily.   SERTRALINE (ZOLOFT) 100 MG TABLET    Take 100 mg by mouth daily.   TAMSULOSIN  (FLOMAX ) 0.4 MG CAPS CAPSULE    Take 1 capsule (0.4 mg total) by mouth at bedtime.   TIZANIDINE  (ZANAFLEX ) 4 MG TABLET    Take 1 tablet (4 mg total) by mouth 3 (three) times daily. Please take 1 tablet twice a day and 2 tabs at bedtime  Modified Medications   No medications on file  Discontinued Medications   No medications on file    Subjective: With past medical history of diabetes type 2, left ankle ulcer, memory deficit with vascular dementia, GERD, depression, PTSD, CKD stage III presents from [facility] health care center seen by Laymon Mages, NP on 04/12/2024.  Noted that there is presents lifting left arterial ankle that has been present for less than 30 days.  Previous care for wound was  provided PCP with workup due to including ABIs/Doppler, lab work, x-ray.  X-ray from left hand [place] on 8/27 revealed no acute bony fracture, subluxation or dislocation.  Severe diffuse osteopenia, calcaneal bone spur, peripheral arthrosclerosis.  Bilateral lower extremity arterial study completed on 03/14/2024 revealed moderate PAD labs with WBC 19.  It was noted that patient had been on Vanco and pip-tazo x 2 weeks which was managed by PCP as well.   Review of Systems: Review of Systems  All other systems reviewed and are negative.   Past Medical History:  Diagnosis Date   Abnormal ECG    Acute ischemic right ACA stroke (HCC)    Coronary artery disease, occlusive    Dysfunction of eustachian tube    History of right ACA stroke    Hyperlipidemia associated with type 2 diabetes mellitus (HCC)     Hypertension    Impotence of organic origin    Lumbago    Lymphadenitis    Unspecified, except mesenteric   Type 2 diabetes, uncontrolled, with retinopathy    Urinary retention 01/2017    Social History   Tobacco Use   Smoking status: Never   Smokeless tobacco: Never  Substance Use Topics   Alcohol use: No   Drug use: No    Family History  Problem Relation Age of Onset   Hypertension Mother    Depression Mother    Cancer Neg Hx    Diabetes Neg Hx    Stroke Neg Hx     Allergies  Allergen Reactions   Beta Adrenergic Blockers Other (See Comments)    Fatigue, but this isn't noted on the patient's [facility]    Health Maintenance  Topic Date Due   Medicare Annual Wellness (AWV)  Never done   FOOT EXAM  Never done   Hepatitis C Screening  Never done   Pneumococcal Vaccine: 50+ Years (1 of 2 - PCV) Never done   Hepatitis B Vaccines 19-59 Average Risk (1 of 3 - 19+ 3-dose series) Never done   Diabetic kidney evaluation - Urine ACR  11/15/2008   Colonoscopy  Never done   HEMOGLOBIN A1C  07/24/2017   Zoster Vaccines- Shingrix (1 of 2) Never done   DTaP/Tdap/Td (3 - Td or Tdap) 04/09/2019   Diabetic kidney evaluation - eGFR measurement  04/22/2020   Influenza Vaccine  Never done   COVID-19 Vaccine (1 - 2025-26 season) Never done   OPHTHALMOLOGY EXAM  11/09/2024   HIV Screening  Completed   HPV VACCINES  Aged Out   Meningococcal B Vaccine  Aged Out    Objective:  There were no vitals filed for this visit. There is no height or weight on file to calculate BMI.  Physical Exam Constitutional:      General: He is not in acute distress.    Appearance: He is normal weight. He is not toxic-appearing.  HENT:     Head: Normocephalic and atraumatic.     Right Ear: External ear normal.     Left Ear: External ear normal.     Nose: No congestion or rhinorrhea.     Mouth/Throat:     Mouth: Mucous membranes are moist.     Pharynx: Oropharynx is clear.  Eyes:     Extraocular  Movements: Extraocular movements intact.     Conjunctiva/sclera: Conjunctivae normal.     Pupils: Pupils are equal, round, and reactive to light.  Cardiovascular:     Rate and Rhythm: Normal rate and regular rhythm.  Heart sounds: No murmur Meetze.    No friction rub. No gallop.  Pulmonary:     Effort: Pulmonary effort is normal.     Breath sounds: Normal breath sounds.  Abdominal:     General: Abdomen is flat. Bowel sounds are normal.     Palpations: Abdomen is soft.  Musculoskeletal:     Cervical back: Normal range of motion and neck supple.  Skin:    General: Skin is warm and dry.  Neurological:     General: No focal deficit present.     Mental Status: He is oriented to person, place, and time.  Psychiatric:        Mood and Affect: Mood normal.    Lab Results Lab Results  Component Value Date   WBC 6.1 04/23/2019   HGB 11.6 (L) 04/23/2019   HCT 35.6 (L) 04/23/2019   MCV 88.3 04/23/2019   PLT 273 04/23/2019    Lab Results  Component Value Date   CREATININE 1.88 (H) 04/23/2019   BUN 22 (H) 04/23/2019   NA 140 04/23/2019   K 4.2 04/23/2019   CL 106 04/23/2019   CO2 25 04/23/2019    Lab Results  Component Value Date   ALT 24 02/12/2017   AST 24 02/12/2017   ALKPHOS 110 02/12/2017   BILITOT 0.6 02/12/2017    Lab Results  Component Value Date   CHOL 138 02/09/2017   HDL 39 (L) 02/09/2017   LDLCALC 73 02/09/2017   LDLDIRECT 85.0 11/16/2007   TRIG 128 02/09/2017   CHOLHDL 3.5 02/09/2017   No results found for: LABRPR, RPRTITER No results found for: HIV1RNAQUANT, HIV1RNAVL, CD4TABS   Problem List Items Addressed This Visit   None  Results   Assessment/Plan #ankle wound #PICC, currently on vancomycin and pip-tazo since 04/18/2024 -Wound care is mangaing picc and  abx  -MRI already ordered. Pt have been on abx.  -Spoke with Carley, CHARITY FUNDRAISER at [facility].  She noted patient had completed 2 weeks of vancomycin and pip-tazo on 8/28.  The  concern for worsening wound left ankle started antibiotics with vancomycin and pip-tazo on 04/18/2024 he has a PICC line  - They have ordered MRI probably be done in the next week or so.  It still needs to be scheduled Plan - Recommended aggressive wound care.  Patient is getting wound care at facility. - Discussed that osteomyelitis  requires 6 weeks of treatment.Defer antibiotic management to ordering provider as he already has a PICC line and has been on antibiotics since 10/1.  - If MRI is concerning for abscess will need I&D. - Patient has a vascular appointment pending as well. -Follow-up in 1 month to review MRI and monitor clinically.  Noted to SNF: Please inform ID know of MRI findings.  Please bring in any recent labs to visit.   Loney Stank, MD [facility] 05/03/2024, 2:04 PM   I have personally spent 107 minutes involved in face-to-face and non-face-to-face activities for this patient on the day of the visit. Professional time spent includes the following activities: Preparing to see the patient (review of tests), Obtaining and/or reviewing separately obtained history (admission/discharge record), Performing a medically appropriate examination and/or evaluation , Ordering medications/tests/procedures, referring and communicating with other health care professionals, Documenting clinical information in the EMR, Independently interpreting results (not separately reported), Communicating results to the patient/family/caregiver, Counseling and educating the patient/family/caregiver and Care coordination (not separately reported).

## 2024-05-03 NOTE — Patient Instructions (Addendum)
 Defer antibiotic management to ordering provider as he already has a PICC line and has been on antibiotics since 10/1.  Generally osteomyelitis is treated with 6 weeks antibiotics course. MRI pending Follow-up in 1 month to review MRI and monitor clinically.  Please inform ID know of MRI findings.  Please bring in any recent labs to visit. Continue aggressive wound care as discussed

## 2024-05-04 ENCOUNTER — Other Ambulatory Visit: Payer: Self-pay

## 2024-05-04 DIAGNOSIS — L97309 Non-pressure chronic ulcer of unspecified ankle with unspecified severity: Secondary | ICD-10-CM

## 2024-05-31 ENCOUNTER — Encounter: Payer: Self-pay | Admitting: Vascular Surgery

## 2024-05-31 ENCOUNTER — Ambulatory Visit (HOSPITAL_COMMUNITY)
Admission: RE | Admit: 2024-05-31 | Discharge: 2024-05-31 | Disposition: A | Discharge status: Home / Self Care | Source: Ambulatory Visit | Attending: Vascular Surgery | Admitting: Vascular Surgery

## 2024-05-31 ENCOUNTER — Ambulatory Visit (INDEPENDENT_AMBULATORY_CARE_PROVIDER_SITE_OTHER): Admitting: Vascular Surgery

## 2024-05-31 VITALS — BP 163/78 | HR 97 | Temp 98.0°F | Resp 24

## 2024-05-31 DIAGNOSIS — L97309 Non-pressure chronic ulcer of unspecified ankle with unspecified severity: Secondary | ICD-10-CM | POA: Insufficient documentation

## 2024-05-31 NOTE — Progress Notes (Signed)
 Office Note     CC: Left lateral malleolar wound Requesting Provider:  No ref. provider found  HPI: Frank Moss is a 53 y.o. (August 20, 1970) male presenting at the request of his long-term care facility with wound on the lateral aspect of the left leg.  Frank Moss is a heavy two-person assist, and uses a Nurse, adult.  He is nonambulatory, and the left leg is contracted.  He was able to answer yes and no questions.  Per his aide, that would have been present for several weeks.  He wears a special brace to offload the site.  Patient was constantly moving, and I was warned by his aide that he will hit.  I was unable to assess for pulses.   Past Medical History:  Diagnosis Date   Abnormal ECG    Acute ischemic right ACA stroke (HCC)    Coronary artery disease, occlusive    Dysfunction of eustachian tube    History of right ACA stroke    Hyperlipidemia associated with type 2 diabetes mellitus (HCC)    Hypertension    Impotence of organic origin    Lumbago    Lymphadenitis    Unspecified, except mesenteric   Type 2 diabetes, uncontrolled, with retinopathy    Urinary retention 01/2017    Past Surgical History:  Procedure Laterality Date   TEE WITHOUT CARDIOVERSION N/A 02/11/2017   Procedure: TRANSESOPHAGEAL ECHOCARDIOGRAM (TEE);  Surgeon: Shlomo Wilbert SAUNDERS, MD;  Location: [facility] OR;  Service: Cardiovascular;  Laterality: N/A;   TEE WITHOUT CARDIOVERSION N/A 02/11/2017   Procedure: TRANSESOPHAGEAL ECHOCARDIOGRAM (TEE);  Surgeon: Shlomo Wilbert SAUNDERS, MD;  Location: [facility] ENDOSCOPY;  Service: Cardiovascular;  Laterality: N/A;    Social History   Socioeconomic History   Marital status: Married    Spouse name: Not on file   Number of children: Not on file   Years of education: Not on file   Highest education level: Not on file  Occupational History   Not on file  Tobacco Use   Smoking status: Never   Smokeless tobacco: Never  Substance and Sexual Activity   Alcohol use: No   Drug use: No    Sexual activity: Not on file  Other Topics Concern   Not on file  Social History Narrative   Has a BS in business administration and an MBA.  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   Social Drivers of Corporate Investment Banker Strain: Not on file  Food Insecurity: Not on file  Transportation Needs: Not on file  Physical Activity: Not on file  Stress: Not on file  Social Connections: Not on file  Intimate Partner Violence: Not on file   Family History  Problem Relation Age of Onset   Hypertension Mother    Depression Mother    Cancer Neg Hx    Diabetes Neg Hx    Stroke Neg Hx     Current Outpatient Medications  Medication Sig Dispense Refill   amLODipine  (NORVASC ) 10 MG tablet Take 1 tablet (10 mg total) by mouth daily. 30 tablet 0   ARIPiprazole  (ABILIFY ) 5 MG tablet Take 5 mg by mouth daily.     atorvastatin  (LIPITOR ) 80 MG tablet Take 1 tablet (80 mg total) by mouth at bedtime. 30 tablet 0   benztropine (COGENTIN) 0.5 MG tablet Take 0.5 mg by mouth daily.     furosemide (LASIX) 20 MG tablet Take 20 mg by mouth every other day as needed for fluid.  hydrALAZINE  (APRESOLINE ) 50 MG tablet Take 1 tablet (50 mg total) by mouth every 8 (eight) hours. 90 tablet 0   irbesartan (AVAPRO) 75 MG tablet Take 75 mg by mouth daily.     nitroGLYCERIN (NITROSTAT) 0.4 MG SL tablet Place 0.4 mg under the tongue every 5 (five) minutes as needed for chest pain.     omeprazole (PRILOSEC) 20 MG capsule Take 20 mg by mouth daily.     sertraline (ZOLOFT) 100 MG tablet Take 100 mg by mouth daily.     tiZANidine  (ZANAFLEX ) 4 MG tablet Take 1 tablet (4 mg total) by mouth 3 (three) times daily. Please take 1 tablet twice a day and 2 tabs at bedtime (Patient taking differently: Take 2-4 mg by mouth See admin instructions. Take 0.5 tablet (2mg  totally) by mouth at breakfast and lunch; take 1 tablet (4 mg totally) by mouth at bed time) 120 tablet 1   No current facility-administered  medications for this visit.    Allergies  Allergen Reactions   Beta Adrenergic Blockers Other (See Comments)    Fatigue, but this isn't noted on the patient's MAR     REVIEW OF SYSTEMS:  [X]  denotes positive finding, [ ]  denotes negative finding Cardiac  Comments:  Chest pain or chest pressure:    Shortness of breath upon exertion:    Short of breath when lying flat:    Irregular heart rhythm:        Vascular    Pain in calf, thigh, or hip brought on by ambulation:    Pain in feet at night that wakes you up from your sleep:     Blood clot in your veins:    Leg swelling:         Pulmonary    Oxygen at home:    Productive cough:     Wheezing:         Neurologic    Sudden weakness in arms or legs:     Sudden numbness in arms or legs:     Sudden onset of difficulty speaking or slurred speech:    Temporary loss of vision in one eye:     Problems with dizziness:         Gastrointestinal    Blood in stool:     Vomited blood:         Genitourinary    Burning when urinating:     Blood in urine:        Psychiatric    Major depression:         Hematologic    Bleeding problems:    Problems with blood clotting too easily:        Skin    Rashes or ulcers:        Constitutional    Fever or chills:      PHYSICAL EXAMINATION:  Vitals:   05/31/24 1136  BP: (!) 163/78  Pulse: 97  Resp: (!) 24  Temp: 98 F (36.7 C)  TempSrc: Temporal  SpO2: 97%    General:  WDWN in NAD; vital signs documented above Gait: Not observed HENT: WNL, normocephalic Pulmonary: normal non-labored breathing , without wheezing Cardiac: regular HR Abdomen: soft, NT, no masses Skin: without rashes Vascular Exam/Pulses: Unable to assess due to patient noncompliance Extremities: without ischemic changes, without Gangrene , without cellulitis; with open wounds;  Musculoskeletal: no muscle wasting or atrophy  Neurologic: A&O X 3;  No focal weakness or paresthesias are  detected Psychiatric:  The pt  has Normal affect.     Non-Invasive Vascular Imaging:   Refused ABI    ASSESSMENT/PLAN: Frank Moss is a 53 y.o. male presenting with left lateral malleoli are wound which appears to be due to pressure.  Recommend continue to offload.  The patient is a two-person heavy lift, Hoyer lift, and has severe contracture of the left leg.  He is not a revascularization candidate.  Recommend continued wound care.  Should wound progress to needing amputation, I would only offer an above-knee amputation.   Fonda FORBES Rim, MD [facility] [phone]

## 2024-06-09 ENCOUNTER — Emergency Department (HOSPITAL_COMMUNITY)

## 2024-06-09 ENCOUNTER — Inpatient Hospital Stay (HOSPITAL_COMMUNITY)
Admission: EM | Admit: 2024-06-09 | Discharge: 2024-06-13 | DRG: 194 | Disposition: A | Discharge status: Skilled Nursing Facility | Source: Skilled Nursing Facility | Attending: Family Medicine | Admitting: Family Medicine

## 2024-06-09 ENCOUNTER — Encounter (HOSPITAL_COMMUNITY): Payer: Self-pay | Admitting: Pharmacy Technician

## 2024-06-09 ENCOUNTER — Other Ambulatory Visit: Payer: Self-pay

## 2024-06-09 DIAGNOSIS — Z888 Allergy status to other drugs, medicaments and biological substances status: Secondary | ICD-10-CM

## 2024-06-09 DIAGNOSIS — R404 Transient alteration of awareness: Secondary | ICD-10-CM | POA: Diagnosis present

## 2024-06-09 DIAGNOSIS — J189 Pneumonia, unspecified organism: Principal | ICD-10-CM | POA: Diagnosis present

## 2024-06-09 DIAGNOSIS — I69354 Hemiplegia and hemiparesis following cerebral infarction affecting left non-dominant side: Secondary | ICD-10-CM

## 2024-06-09 DIAGNOSIS — K219 Gastro-esophageal reflux disease without esophagitis: Secondary | ICD-10-CM | POA: Diagnosis present

## 2024-06-09 DIAGNOSIS — I10 Essential (primary) hypertension: Secondary | ICD-10-CM | POA: Diagnosis present

## 2024-06-09 DIAGNOSIS — E876 Hypokalemia: Secondary | ICD-10-CM | POA: Diagnosis present

## 2024-06-09 DIAGNOSIS — I2510 Atherosclerotic heart disease of native coronary artery without angina pectoris: Secondary | ICD-10-CM | POA: Diagnosis present

## 2024-06-09 DIAGNOSIS — Z818 Family history of other mental and behavioral disorders: Secondary | ICD-10-CM

## 2024-06-09 DIAGNOSIS — F0153 Vascular dementia, unspecified severity, with mood disturbance: Secondary | ICD-10-CM | POA: Diagnosis present

## 2024-06-09 DIAGNOSIS — D649 Anemia, unspecified: Secondary | ICD-10-CM | POA: Diagnosis present

## 2024-06-09 DIAGNOSIS — F32A Depression, unspecified: Secondary | ICD-10-CM | POA: Diagnosis present

## 2024-06-09 DIAGNOSIS — I6930 Unspecified sequelae of cerebral infarction: Secondary | ICD-10-CM

## 2024-06-09 DIAGNOSIS — I129 Hypertensive chronic kidney disease with stage 1 through stage 4 chronic kidney disease, or unspecified chronic kidney disease: Secondary | ICD-10-CM | POA: Diagnosis present

## 2024-06-09 DIAGNOSIS — E119 Type 2 diabetes mellitus without complications: Secondary | ICD-10-CM

## 2024-06-09 DIAGNOSIS — N1832 Chronic kidney disease, stage 3b: Secondary | ICD-10-CM | POA: Diagnosis present

## 2024-06-09 DIAGNOSIS — E538 Deficiency of other specified B group vitamins: Secondary | ICD-10-CM | POA: Diagnosis present

## 2024-06-09 DIAGNOSIS — D631 Anemia in chronic kidney disease: Secondary | ICD-10-CM | POA: Diagnosis present

## 2024-06-09 DIAGNOSIS — R4182 Altered mental status, unspecified: Secondary | ICD-10-CM

## 2024-06-09 DIAGNOSIS — I69391 Dysphagia following cerebral infarction: Secondary | ICD-10-CM

## 2024-06-09 DIAGNOSIS — Z1152 Encounter for screening for COVID-19: Secondary | ICD-10-CM

## 2024-06-09 DIAGNOSIS — R7989 Other specified abnormal findings of blood chemistry: Secondary | ICD-10-CM | POA: Diagnosis present

## 2024-06-09 DIAGNOSIS — E1122 Type 2 diabetes mellitus with diabetic chronic kidney disease: Secondary | ICD-10-CM | POA: Diagnosis present

## 2024-06-09 DIAGNOSIS — Z79899 Other long term (current) drug therapy: Secondary | ICD-10-CM

## 2024-06-09 DIAGNOSIS — Z8249 Family history of ischemic heart disease and other diseases of the circulatory system: Secondary | ICD-10-CM

## 2024-06-09 DIAGNOSIS — I69319 Unspecified symptoms and signs involving cognitive functions following cerebral infarction: Secondary | ICD-10-CM

## 2024-06-09 DIAGNOSIS — I2489 Other forms of acute ischemic heart disease: Secondary | ICD-10-CM | POA: Diagnosis present

## 2024-06-09 DIAGNOSIS — I6503 Occlusion and stenosis of bilateral vertebral arteries: Secondary | ICD-10-CM | POA: Diagnosis present

## 2024-06-09 LAB — CBC WITH DIFFERENTIAL/PLATELET
Abs Immature Granulocytes: 0.02 K/uL (ref 0.00–0.07)
Basophils Absolute: 0.1 K/uL (ref 0.0–0.1)
Basophils Relative: 1 %
Eosinophils Absolute: 0.2 K/uL (ref 0.0–0.5)
Eosinophils Relative: 4 %
HCT: 26.2 % — ABNORMAL LOW (ref 39.0–52.0)
Hemoglobin: 8.1 g/dL — ABNORMAL LOW (ref 13.0–17.0)
Immature Granulocytes: 0 %
Lymphocytes Relative: 27 %
Lymphs Abs: 1.6 K/uL (ref 0.7–4.0)
MCH: 27.7 pg (ref 26.0–34.0)
MCHC: 30.9 g/dL (ref 30.0–36.0)
MCV: 89.7 fL (ref 80.0–100.0)
Monocytes Absolute: 0.5 K/uL (ref 0.1–1.0)
Monocytes Relative: 8 %
Neutro Abs: 3.6 K/uL (ref 1.7–7.7)
Neutrophils Relative %: 60 %
Platelets: 382 K/uL (ref 150–400)
RBC: 2.92 MIL/uL — ABNORMAL LOW (ref 4.22–5.81)
RDW: 15.8 % — ABNORMAL HIGH (ref 11.5–15.5)
WBC: 6.1 K/uL (ref 4.0–10.5)
nRBC: 0 % (ref 0.0–0.2)

## 2024-06-09 LAB — I-STAT VENOUS BLOOD GAS, ED
Acid-Base Excess: 8 mmol/L — ABNORMAL HIGH (ref 0.0–2.0)
Bicarbonate: 31.5 mmol/L — ABNORMAL HIGH (ref 20.0–28.0)
Calcium, Ion: 0.95 mmol/L — ABNORMAL LOW (ref 1.15–1.40)
HCT: 26 % — ABNORMAL LOW (ref 39.0–52.0)
Hemoglobin: 8.8 g/dL — ABNORMAL LOW (ref 13.0–17.0)
O2 Saturation: 81 %
Potassium: 3 mmol/L — ABNORMAL LOW (ref 3.5–5.1)
Sodium: 140 mmol/L (ref 135–145)
TCO2: 33 mmol/L — ABNORMAL HIGH (ref 22–32)
pCO2, Ven: 40.6 mmHg — ABNORMAL LOW (ref 44–60)
pH, Ven: 7.497 — ABNORMAL HIGH (ref 7.25–7.43)
pO2, Ven: 41 mmHg (ref 32–45)

## 2024-06-09 LAB — COMPREHENSIVE METABOLIC PANEL WITH GFR
ALT: 11 U/L (ref 0–44)
AST: 15 U/L (ref 15–41)
Albumin: 2.3 g/dL — ABNORMAL LOW (ref 3.5–5.0)
Alkaline Phosphatase: 69 U/L (ref 38–126)
Anion gap: 13 (ref 5–15)
BUN: 16 mg/dL (ref 6–20)
CO2: 29 mmol/L (ref 22–32)
Calcium: 7.4 mg/dL — ABNORMAL LOW (ref 8.9–10.3)
Chloride: 100 mmol/L (ref 98–111)
Creatinine, Ser: 1.69 mg/dL — ABNORMAL HIGH (ref 0.61–1.24)
GFR, Estimated: 47 mL/min — ABNORMAL LOW (ref >60)
Glucose, Bld: 83 mg/dL (ref 70–99)
Potassium: 3 mmol/L — ABNORMAL LOW (ref 3.5–5.1)
Sodium: 142 mmol/L (ref 135–145)
Total Bilirubin: 0.4 mg/dL (ref 0.0–1.2)
Total Protein: 6 g/dL — ABNORMAL LOW (ref 6.5–8.1)

## 2024-06-09 LAB — URINALYSIS, W/ REFLEX TO CULTURE (INFECTION SUSPECTED)
Bilirubin Urine: NEGATIVE
Glucose, UA: NEGATIVE mg/dL
Hgb urine dipstick: NEGATIVE
Ketones, ur: NEGATIVE mg/dL
Leukocytes,Ua: NEGATIVE
Nitrite: NEGATIVE
Protein, ur: 100 mg/dL — AB
Specific Gravity, Urine: 1.014 (ref 1.005–1.030)
pH: 5 (ref 5.0–8.0)

## 2024-06-09 LAB — RESP PANEL BY RT-PCR (RSV, FLU A&B, COVID)  RVPGX2
Influenza A by PCR: NEGATIVE
Influenza B by PCR: NEGATIVE
Resp Syncytial Virus by PCR: NEGATIVE
SARS Coronavirus 2 by RT PCR: NEGATIVE

## 2024-06-09 LAB — RAPID URINE DRUG SCREEN, HOSP PERFORMED
Amphetamines: NOT DETECTED
Barbiturates: NOT DETECTED
Benzodiazepines: NOT DETECTED
Cocaine: NOT DETECTED
Opiates: NOT DETECTED
Tetrahydrocannabinol: NOT DETECTED

## 2024-06-09 LAB — TROPONIN I (HIGH SENSITIVITY)
Troponin I (High Sensitivity): 22 ng/L — ABNORMAL HIGH (ref <18)
Troponin I (High Sensitivity): 21 ng/L — ABNORMAL HIGH (ref <18)

## 2024-06-09 LAB — I-STAT CHEM 8, ED
BUN: 17 mg/dL (ref 6–20)
Calcium, Ion: 0.95 mmol/L — ABNORMAL LOW (ref 1.15–1.40)
Chloride: 97 mmol/L — ABNORMAL LOW (ref 98–111)
Creatinine, Ser: 1.8 mg/dL — ABNORMAL HIGH (ref 0.61–1.24)
Glucose, Bld: 86 mg/dL (ref 70–99)
HCT: 24 % — ABNORMAL LOW (ref 39.0–52.0)
Hemoglobin: 8.2 g/dL — ABNORMAL LOW (ref 13.0–17.0)
Potassium: 3 mmol/L — ABNORMAL LOW (ref 3.5–5.1)
Sodium: 142 mmol/L (ref 135–145)
TCO2: 29 mmol/L (ref 22–32)

## 2024-06-09 LAB — MAGNESIUM: Magnesium: 1.2 mg/dL — ABNORMAL LOW (ref 1.7–2.4)

## 2024-06-09 LAB — AMMONIA: Ammonia: 28 umol/L (ref 9–35)

## 2024-06-09 LAB — ETHANOL: Alcohol, Ethyl (B): <15 mg/dL (ref <15)

## 2024-06-09 LAB — CBG MONITORING, ED: Glucose-Capillary: 74 mg/dL (ref 70–99)

## 2024-06-09 MED ORDER — SODIUM CHLORIDE 0.9 % IV SOLN
1.0000 g | Freq: Once | INTRAVENOUS | Status: AC
  Administered 2024-06-10: 1 g via INTRAVENOUS
  Filled 2024-06-09: qty 10

## 2024-06-09 MED ORDER — SODIUM CHLORIDE 0.9 % IV SOLN
500.0000 mg | Freq: Once | INTRAVENOUS | Status: AC
  Administered 2024-06-10: 500 mg via INTRAVENOUS
  Filled 2024-06-09: qty 5

## 2024-06-09 NOTE — ED Provider Notes (Signed)
  Provider Note MRN:  [id number]  Arrival date & time: 06/10/24    ED Course and Medical Decision Making  Assumed care of patient at sign-out or upon transfer.  Coming from care facility with altered mental status awaiting CT scan.  Evidence of pneumonia, plan is for admission.  12:45 AM update: CTA without acute process.  Patient somnolent on my exam but can mumble yes or no to questions.  Plan is for hospitalist admission.  Procedures  Final Clinical Impressions(s) / ED Diagnoses     ICD-10-CM   1. Community acquired pneumonia, unspecified laterality  J18.9     2. Altered mental status, unspecified altered mental status type  R41.82       ED Discharge Orders     None       Discharge Instructions   None     Ozell HERO. Theadore, MD [facility] Emergency Medicine [facility] [email or web address]    Theadore Ozell HERO, MD 06/10/24 [phone]

## 2024-06-09 NOTE — ED Triage Notes (Signed)
 Pt bib ems from [facility]. Approx 1.5 hours ago pt went unresponsive. GCS 3. 500cc NS given en route.    180/100 NSR 60-70 RR 14

## 2024-06-09 NOTE — ED Notes (Signed)
 0.5mg  Narcan given per Dr. Dreama verbal order.

## 2024-06-09 NOTE — ED Provider Notes (Signed)
 [place] EMERGENCY DEPARTMENT AT [facility] Provider Note   CSN: [id number] Arrival date & time: 06/09/24  8169     Patient presents with: Altered Mental Status   Frank Moss is a 53 y.o. male.  {Add pertinent medical, surgical, social history, OB history to HPI:32947} HPI     53 year old male with history of ischemic right ACA stroke with residual left-sided weakness, hemineglect of the left side, dysphagia, type 2 diabetes, hypertension, hyperlipidemia, coronary artery disease who presents with concern for altered mental status.  I attempted to call [facility] regarding his presentation, but was not able to get hold of anyone.  Per recent notes of vascular surgery he is a heavy two-person assist, uses a Nurse, adult, is nonambulatory with a contracted left lower extremity.  He is able to answer yes and no questions.  No report he is last seen an hour and half earlier, was found to be minimally responsive.   Past Medical History:  Diagnosis Date   Abnormal ECG    Acute ischemic right ACA stroke (HCC)    Coronary artery disease, occlusive    Dysfunction of eustachian tube    History of right ACA stroke    Hyperlipidemia associated with type 2 diabetes mellitus (HCC)    Hypertension    Impotence of organic origin    Lumbago    Lymphadenitis    Unspecified, except mesenteric   Type 2 diabetes, uncontrolled, with retinopathy    Urinary retention 01/2017     Prior to Admission medications   Medication Sig Start Date End Date Taking? Authorizing Provider  amLODipine  (NORVASC ) 10 MG tablet Take 1 tablet (10 mg total) by mouth daily. 03/10/17   Angiulli, Toribio PARAS, PA-C  ARIPiprazole  (ABILIFY ) 5 MG tablet Take 5 mg by mouth daily.    [provider]  atorvastatin  (LIPITOR ) 80 MG tablet Take 1 tablet (80 mg total) by mouth at bedtime. 03/10/17   Angiulli, Toribio PARAS, PA-C  benztropine (COGENTIN) 0.5 MG tablet Take 0.5 mg by mouth daily. 02/25/19    [provider]  furosemide (LASIX) 20 MG tablet Take 20 mg by mouth every other day as needed for fluid.    [provider]  hydrALAZINE  (APRESOLINE ) 50 MG tablet Take 1 tablet (50 mg total) by mouth every 8 (eight) hours. 03/10/17   Angiulli, Toribio PARAS, PA-C  irbesartan (AVAPRO) 75 MG tablet Take 75 mg by mouth daily. 02/25/19   [provider]  nitroGLYCERIN (NITROSTAT) 0.4 MG SL tablet Place 0.4 mg under the tongue every 5 (five) minutes as needed for chest pain.    [provider]  omeprazole (PRILOSEC) 20 MG capsule Take 20 mg by mouth daily. 03/16/19   [provider]  sertraline  (ZOLOFT ) 100 MG tablet Take 100 mg by mouth daily.    [provider]  tiZANidine  (ZANAFLEX ) 4 MG tablet Take 1 tablet (4 mg total) by mouth 3 (three) times daily. Please take 1 tablet twice a day and 2 tabs at bedtime Patient taking differently: Take 2-4 mg by mouth See admin instructions. Take 0.5 tablet (2mg  totally) by mouth at breakfast and lunch; take 1 tablet (4 mg totally) by mouth at bed time 05/03/17   Carilyn Prentice BRAVO, MD    Allergies: Beta adrenergic blockers    Review of Systems  Updated Vital Signs BP (!) 167/80   Pulse 77   Temp 97.6 F (36.4 C) (Axillary)   Resp 11   SpO2 98%  Physical Exam  (all labs ordered are listed, but only abnormal results are displayed) Labs Reviewed  CBC WITH DIFFERENTIAL/PLATELET - Abnormal; Notable for the following components:      Result Value   RBC 2.92 (*)    Hemoglobin 8.1 (*)    HCT 26.2 (*)    RDW 15.8 (*)    All other components within normal limits  COMPREHENSIVE METABOLIC PANEL WITH GFR - Abnormal; Notable for the following components:   Potassium 3.0 (*)    Creatinine, Ser 1.69 (*)    Calcium  7.4 (*)    Total Protein 6.0 (*)    Albumin 2.3 (*)    GFR, Estimated 47 (*)    All other components within normal limits  MAGNESIUM  - Abnormal; Notable for the following components:   Magnesium   1.2 (*)    All other components within normal limits  URINALYSIS, W/ REFLEX TO CULTURE (INFECTION SUSPECTED) - Abnormal; Notable for the following components:   APPearance HAZY (*)    Protein, ur 100 (*)    Bacteria, UA RARE (*)    All other components within normal limits  I-STAT CHEM 8, ED - Abnormal; Notable for the following components:   Potassium 3.0 (*)    Chloride 97 (*)    Creatinine, Ser 1.80 (*)    Calcium , Ion 0.95 (*)    Hemoglobin 8.2 (*)    HCT 24.0 (*)    All other components within normal limits  I-STAT VENOUS BLOOD GAS, ED - Abnormal; Notable for the following components:   pH, Ven 7.497 (*)    pCO2, Ven 40.6 (*)    Bicarbonate 31.5 (*)    TCO2 33 (*)    Acid-Base Excess 8.0 (*)    Potassium 3.0 (*)    Calcium , Ion 0.95 (*)    HCT 26.0 (*)    Hemoglobin 8.8 (*)    All other components within normal limits  TROPONIN I (HIGH SENSITIVITY) - Abnormal; Notable for the following components:   Troponin I (High Sensitivity) 22 (*)    All other components within normal limits  RESP PANEL BY RT-PCR (RSV, FLU A&B, COVID)  RVPGX2  AMMONIA  RAPID URINE DRUG SCREEN, HOSP PERFORMED  ETHANOL  CBG MONITORING, ED  TROPONIN I (HIGH SENSITIVITY)    EKG: None  Radiology: DG Chest Portable 1 View Result Date: 06/09/2024 CLINICAL DATA:  Short of breath EXAM: PORTABLE CHEST 1 VIEW COMPARISON:  02/08/2017 FINDINGS: Single frontal view of the chest demonstrates an unremarkable cardiac silhouette. There is increased density at the left lung base obscuring the left hemidiaphragm, consistent with left lower lobe consolidation and likely left pleural effusion. Right chest is clear. No pneumothorax. No acute bony abnormalities. IMPRESSION: 1. Left lower lobe consolidation, which may reflect atelectasis or airspace disease. 2. Small left pleural effusion. Electronically Signed   By: Ozell Daring M.D.   On: 06/09/2024 19:17    {Document cardiac monitor, telemetry assessment procedure  when appropriate:32947} Procedures   Medications Ordered in the ED - No data to display    {Click here for ABCD2, HEART and other calculators REFRESH Note before signing:1}                              Medical Decision Making Amount and/or Complexity of Data Reviewed Labs: ordered. Radiology: ordered.    53 year old male with history of ischemic right ACA stroke with residual left-sided weakness, hemineglect of the left side,  dysphagia, type 2 diabetes, hypertension, hyperlipidemia, coronary artery disease who presents with concern for altered mental status.  {Document critical care time when appropriate  Document review of labs and clinical decision tools ie CHADS2VASC2, etc  Document your independent review of radiology images and any outside records  Document your discussion with family members, caretakers and with consultants  Document social determinants of health affecting pt's care  Document your decision making why or why not admission, treatments were needed:32947:::1}   Final diagnoses:  None    ED Discharge Orders     None

## 2024-06-10 ENCOUNTER — Inpatient Hospital Stay (HOSPITAL_COMMUNITY)

## 2024-06-10 DIAGNOSIS — D649 Anemia, unspecified: Secondary | ICD-10-CM | POA: Diagnosis not present

## 2024-06-10 DIAGNOSIS — R7989 Other specified abnormal findings of blood chemistry: Secondary | ICD-10-CM

## 2024-06-10 DIAGNOSIS — Z818 Family history of other mental and behavioral disorders: Secondary | ICD-10-CM | POA: Diagnosis not present

## 2024-06-10 DIAGNOSIS — I69391 Dysphagia following cerebral infarction: Secondary | ICD-10-CM | POA: Diagnosis not present

## 2024-06-10 DIAGNOSIS — Z8249 Family history of ischemic heart disease and other diseases of the circulatory system: Secondary | ICD-10-CM | POA: Diagnosis not present

## 2024-06-10 DIAGNOSIS — N1832 Chronic kidney disease, stage 3b: Secondary | ICD-10-CM | POA: Diagnosis present

## 2024-06-10 DIAGNOSIS — I69354 Hemiplegia and hemiparesis following cerebral infarction affecting left non-dominant side: Secondary | ICD-10-CM | POA: Diagnosis not present

## 2024-06-10 DIAGNOSIS — I129 Hypertensive chronic kidney disease with stage 1 through stage 4 chronic kidney disease, or unspecified chronic kidney disease: Secondary | ICD-10-CM | POA: Diagnosis present

## 2024-06-10 DIAGNOSIS — J189 Pneumonia, unspecified organism: Secondary | ICD-10-CM | POA: Diagnosis present

## 2024-06-10 DIAGNOSIS — R569 Unspecified convulsions: Secondary | ICD-10-CM | POA: Diagnosis not present

## 2024-06-10 DIAGNOSIS — I6930 Unspecified sequelae of cerebral infarction: Secondary | ICD-10-CM

## 2024-06-10 DIAGNOSIS — R404 Transient alteration of awareness: Secondary | ICD-10-CM | POA: Diagnosis present

## 2024-06-10 DIAGNOSIS — I2510 Atherosclerotic heart disease of native coronary artery without angina pectoris: Secondary | ICD-10-CM | POA: Diagnosis present

## 2024-06-10 DIAGNOSIS — E1122 Type 2 diabetes mellitus with diabetic chronic kidney disease: Secondary | ICD-10-CM | POA: Diagnosis present

## 2024-06-10 DIAGNOSIS — Z1152 Encounter for screening for COVID-19: Secondary | ICD-10-CM | POA: Diagnosis not present

## 2024-06-10 DIAGNOSIS — D631 Anemia in chronic kidney disease: Secondary | ICD-10-CM | POA: Diagnosis present

## 2024-06-10 DIAGNOSIS — E119 Type 2 diabetes mellitus without complications: Secondary | ICD-10-CM | POA: Diagnosis not present

## 2024-06-10 DIAGNOSIS — Z888 Allergy status to other drugs, medicaments and biological substances status: Secondary | ICD-10-CM | POA: Diagnosis not present

## 2024-06-10 DIAGNOSIS — F0153 Vascular dementia, unspecified severity, with mood disturbance: Secondary | ICD-10-CM | POA: Diagnosis present

## 2024-06-10 DIAGNOSIS — Z79899 Other long term (current) drug therapy: Secondary | ICD-10-CM | POA: Diagnosis not present

## 2024-06-10 DIAGNOSIS — I69319 Unspecified symptoms and signs involving cognitive functions following cerebral infarction: Secondary | ICD-10-CM

## 2024-06-10 DIAGNOSIS — K219 Gastro-esophageal reflux disease without esophagitis: Secondary | ICD-10-CM | POA: Diagnosis present

## 2024-06-10 DIAGNOSIS — I10 Essential (primary) hypertension: Secondary | ICD-10-CM

## 2024-06-10 DIAGNOSIS — E538 Deficiency of other specified B group vitamins: Secondary | ICD-10-CM | POA: Diagnosis present

## 2024-06-10 DIAGNOSIS — F32A Depression, unspecified: Secondary | ICD-10-CM | POA: Diagnosis present

## 2024-06-10 DIAGNOSIS — I6503 Occlusion and stenosis of bilateral vertebral arteries: Secondary | ICD-10-CM | POA: Diagnosis present

## 2024-06-10 DIAGNOSIS — E876 Hypokalemia: Secondary | ICD-10-CM | POA: Diagnosis present

## 2024-06-10 DIAGNOSIS — I2489 Other forms of acute ischemic heart disease: Secondary | ICD-10-CM | POA: Diagnosis present

## 2024-06-10 LAB — BLOOD CULTURE ID PANEL (REFLEXED) - BCID2

## 2024-06-10 LAB — RETICULOCYTES
Immature Retic Fract: 24.2 % — ABNORMAL HIGH (ref 2.3–15.9)
RBC.: 2.93 MIL/uL — ABNORMAL LOW (ref 4.22–5.81)
Retic Count, Absolute: 54.5 K/uL (ref 19.0–186.0)
Retic Ct Pct: 1.9 % (ref 0.4–3.1)

## 2024-06-10 LAB — ABO/RH: ABO/RH(D): A POS

## 2024-06-10 LAB — IRON AND TIBC
Iron: 27 ug/dL — ABNORMAL LOW (ref 45–182)
Saturation Ratios: 19 % (ref 17.9–39.5)
TIBC: 144 ug/dL — ABNORMAL LOW (ref 250–450)
UIBC: 117 ug/dL

## 2024-06-10 LAB — CBC
HCT: 27.3 % — ABNORMAL LOW (ref 39.0–52.0)
Hemoglobin: 8.5 g/dL — ABNORMAL LOW (ref 13.0–17.0)
MCH: 28.1 pg (ref 26.0–34.0)
MCHC: 31.1 g/dL (ref 30.0–36.0)
MCV: 90.1 fL (ref 80.0–100.0)
Platelets: 393 K/uL (ref 150–400)
RBC: 3.03 MIL/uL — ABNORMAL LOW (ref 4.22–5.81)
RDW: 15.9 % — ABNORMAL HIGH (ref 11.5–15.5)
WBC: 6.9 K/uL (ref 4.0–10.5)
nRBC: 0 % (ref 0.0–0.2)

## 2024-06-10 LAB — GLUCOSE, CAPILLARY
Glucose-Capillary: 132 mg/dL — ABNORMAL HIGH (ref 70–99)
Glucose-Capillary: 101 mg/dL — ABNORMAL HIGH (ref 70–99)

## 2024-06-10 LAB — FOLATE: Folate: 4.5 ng/mL — ABNORMAL LOW (ref >5.9)

## 2024-06-10 LAB — TYPE AND SCREEN
ABO/RH(D): A POS
Antibody Screen: NEGATIVE

## 2024-06-10 LAB — HIV ANTIBODY (ROUTINE TESTING W REFLEX): HIV Screen 4th Generation wRfx: NONREACTIVE

## 2024-06-10 LAB — FERRITIN: Ferritin: 202 ng/mL (ref 24–336)

## 2024-06-10 LAB — I-STAT CG4 LACTIC ACID, ED: Lactic Acid, Venous: 0.9 mmol/L (ref 0.5–1.9)

## 2024-06-10 LAB — HEMOGLOBIN A1C
Hgb A1c MFr Bld: 4 % — ABNORMAL LOW (ref 4.8–5.6)
Mean Plasma Glucose: 68.1 mg/dL

## 2024-06-10 LAB — VITAMIN B12: Vitamin B-12: 1154 pg/mL — ABNORMAL HIGH (ref 180–914)

## 2024-06-10 LAB — VALPROIC ACID LEVEL: Valproic Acid Lvl: <10 ug/mL — ABNORMAL LOW (ref 50–100)

## 2024-06-10 LAB — PROCALCITONIN: Procalcitonin: <0.1 ng/mL

## 2024-06-10 LAB — D-DIMER, QUANTITATIVE: D-Dimer, Quant: 7.21 ug{FEU}/mL — ABNORMAL HIGH (ref 0.00–0.50)

## 2024-06-10 LAB — CBG MONITORING, ED
Glucose-Capillary: 87 mg/dL (ref 70–99)
Glucose-Capillary: 91 mg/dL (ref 70–99)

## 2024-06-10 MED ORDER — MAGNESIUM SULFATE 2 GM/50ML IV SOLN
2.0000 g | Freq: Once | INTRAVENOUS | Status: AC
  Administered 2024-06-10: 2 g via INTRAVENOUS
  Filled 2024-06-10: qty 50

## 2024-06-10 MED ORDER — LOSARTAN POTASSIUM 50 MG PO TABS
25.0000 mg | ORAL_TABLET | Freq: Every day | ORAL | Status: DC
  Administered 2024-06-10 – 2024-06-11 (×2): 25 mg via ORAL
  Filled 2024-06-10 (×2): qty 1

## 2024-06-10 MED ORDER — SODIUM CHLORIDE 0.9% FLUSH
3.0000 mL | Freq: Two times a day (BID) | INTRAVENOUS | Status: DC
  Administered 2024-06-10 – 2024-06-13 (×5): 3 mL via INTRAVENOUS

## 2024-06-10 MED ORDER — HYDRALAZINE HCL 20 MG/ML IJ SOLN
10.0000 mg | INTRAMUSCULAR | Status: DC | PRN
  Administered 2024-06-10: 10 mg via INTRAVENOUS
  Filled 2024-06-10: qty 1

## 2024-06-10 MED ORDER — INSULIN ASPART 100 UNIT/ML IJ SOLN: 0.0000 [IU] | Freq: Three times a day (TID) | INTRAMUSCULAR | Status: DC

## 2024-06-10 MED ORDER — HYDRALAZINE HCL 50 MG PO TABS
100.0000 mg | ORAL_TABLET | Freq: Three times a day (TID) | ORAL | Status: DC
  Administered 2024-06-10 – 2024-06-13 (×9): 100 mg via ORAL
  Filled 2024-06-10 (×9): qty 2

## 2024-06-10 MED ORDER — POLYETHYLENE GLYCOL 3350 17 G PO PACK
17.0000 g | PACK | Freq: Every day | ORAL | Status: DC
  Administered 2024-06-11 – 2024-06-13 (×3): 17 g via ORAL
  Filled 2024-06-10 (×4): qty 1

## 2024-06-10 MED ORDER — ENOXAPARIN SODIUM 40 MG/0.4ML IJ SOSY: 40.0000 mg | PREFILLED_SYRINGE | INTRAMUSCULAR | Status: DC

## 2024-06-10 MED ORDER — CHLORHEXIDINE GLUCONATE 0.12 % MT SOLN
15.0000 mL | Freq: Two times a day (BID) | OROMUCOSAL | Status: DC
  Administered 2024-06-10 – 2024-06-13 (×6): 15 mL via OROMUCOSAL
  Filled 2024-06-10 (×8): qty 15

## 2024-06-10 MED ORDER — POTASSIUM CHLORIDE 10 MEQ/100ML IV SOLN
10.0000 meq | INTRAVENOUS | Status: AC
  Administered 2024-06-10 (×3): 10 meq via INTRAVENOUS
  Filled 2024-06-10 (×3): qty 100

## 2024-06-10 MED ORDER — PANTOPRAZOLE SODIUM 40 MG PO TBEC
40.0000 mg | DELAYED_RELEASE_TABLET | Freq: Every day | ORAL | Status: DC
  Administered 2024-06-10 – 2024-06-13 (×4): 40 mg via ORAL
  Filled 2024-06-10 (×4): qty 1

## 2024-06-10 MED ORDER — IOHEXOL 350 MG/ML SOLN
75.0000 mL | Freq: Once | INTRAVENOUS | Status: AC | PRN
  Administered 2024-06-10: 75 mL via INTRAVENOUS

## 2024-06-10 MED ORDER — AMLODIPINE BESYLATE 10 MG PO TABS
10.0000 mg | ORAL_TABLET | Freq: Every day | ORAL | Status: DC
  Administered 2024-06-10 – 2024-06-13 (×4): 10 mg via ORAL
  Filled 2024-06-10: qty 2
  Filled 2024-06-10 (×2): qty 1
  Filled 2024-06-10: qty 2

## 2024-06-10 MED ORDER — SERTRALINE HCL 50 MG PO TABS
75.0000 mg | ORAL_TABLET | Freq: Every day | ORAL | Status: DC
  Administered 2024-06-10 – 2024-06-13 (×4): 75 mg via ORAL
  Filled 2024-06-10 (×2): qty 1
  Filled 2024-06-10: qty 2
  Filled 2024-06-10: qty 1

## 2024-06-10 MED ORDER — ATORVASTATIN CALCIUM 10 MG PO TABS
20.0000 mg | ORAL_TABLET | Freq: Every day | ORAL | Status: DC
  Administered 2024-06-10 – 2024-06-12 (×3): 20 mg via ORAL
  Filled 2024-06-10 (×3): qty 2

## 2024-06-10 MED ORDER — SENNOSIDES-DOCUSATE SODIUM 8.6-50 MG PO TABS
2.0000 | ORAL_TABLET | Freq: Two times a day (BID) | ORAL | Status: DC
  Administered 2024-06-10 – 2024-06-13 (×5): 2 via ORAL
  Filled 2024-06-10 (×7): qty 2

## 2024-06-10 MED ORDER — SODIUM CHLORIDE 0.9 % IV SOLN
100.0000 mg | Freq: Two times a day (BID) | INTRAVENOUS | Status: DC
  Administered 2024-06-10 – 2024-06-12 (×6): 100 mg via INTRAVENOUS
  Filled 2024-06-10 (×7): qty 100

## 2024-06-10 MED ORDER — ENOXAPARIN SODIUM 40 MG/0.4ML IJ SOSY
40.0000 mg | PREFILLED_SYRINGE | INTRAMUSCULAR | Status: DC
  Administered 2024-06-10 – 2024-06-12 (×3): 40 mg via SUBCUTANEOUS
  Filled 2024-06-10 (×3): qty 0.4

## 2024-06-10 MED ORDER — ACETAMINOPHEN 325 MG PO TABS: 650.0000 mg | ORAL_TABLET | Freq: Four times a day (QID) | ORAL | Status: DC | PRN

## 2024-06-10 MED ORDER — SODIUM CHLORIDE 0.9 % IV SOLN
2.0000 g | INTRAVENOUS | Status: DC
  Administered 2024-06-11 – 2024-06-13 (×3): 2 g via INTRAVENOUS
  Filled 2024-06-10 (×3): qty 20

## 2024-06-10 MED ORDER — POTASSIUM CHLORIDE CRYS ER 20 MEQ PO TBCR
20.0000 meq | EXTENDED_RELEASE_TABLET | Freq: Once | ORAL | Status: AC
  Administered 2024-06-10: 20 meq via ORAL
  Filled 2024-06-10: qty 1

## 2024-06-10 MED ORDER — TAMSULOSIN HCL 0.4 MG PO CAPS
0.4000 mg | ORAL_CAPSULE | Freq: Every day | ORAL | Status: DC
  Administered 2024-06-10 – 2024-06-12 (×3): 0.4 mg via ORAL
  Filled 2024-06-10 (×3): qty 1

## 2024-06-10 MED ORDER — DIVALPROEX SODIUM 125 MG PO DR TAB
125.0000 mg | DELAYED_RELEASE_TABLET | Freq: Every day | ORAL | Status: DC
Start: 2024-06-10 — End: 2024-06-13
  Administered 2024-06-10 – 2024-06-12 (×3): 125 mg via ORAL
  Filled 2024-06-10 (×4): qty 1

## 2024-06-10 MED ORDER — LACTULOSE 10 GM/15ML PO SOLN
10.0000 g | ORAL | Status: DC
  Administered 2024-06-11 – 2024-06-13 (×2): 10 g via ORAL
  Filled 2024-06-10 (×2): qty 15

## 2024-06-10 MED ORDER — GUAIFENESIN ER 600 MG PO TB12
600.0000 mg | ORAL_TABLET | Freq: Two times a day (BID) | ORAL | Status: DC
  Administered 2024-06-10 – 2024-06-13 (×6): 600 mg via ORAL
  Filled 2024-06-10 (×7): qty 1

## 2024-06-10 MED ORDER — KETOCONAZOLE 2 % EX SHAM
1.0000 | MEDICATED_SHAMPOO | CUTANEOUS | Status: DC
  Administered 2024-06-11 – 2024-06-13 (×2): 1 via TOPICAL
  Filled 2024-06-10: qty 120

## 2024-06-10 MED ORDER — CALCIUM GLUCONATE-NACL 2-0.675 GM/100ML-% IV SOLN
2.0000 g | Freq: Once | INTRAVENOUS | Status: AC
  Administered 2024-06-10: 2000 mg via INTRAVENOUS
  Filled 2024-06-10: qty 100

## 2024-06-10 MED ORDER — VALBENAZINE TOSYLATE 60 MG PO CAPS
60.0000 mg | ORAL_CAPSULE | Freq: Every evening | ORAL | Status: DC
  Administered 2024-06-11 – 2024-06-12 (×2): 60 mg via ORAL
  Filled 2024-06-10 (×4): qty 1

## 2024-06-10 MED ORDER — ACETAMINOPHEN 650 MG RE SUPP: 650.0000 mg | Freq: Four times a day (QID) | RECTAL | Status: DC | PRN

## 2024-06-10 MED ORDER — OXYCODONE-ACETAMINOPHEN 5-325 MG PO TABS: 1.0000 | ORAL_TABLET | Freq: Every day | ORAL | Status: DC | PRN

## 2024-06-10 NOTE — Final Progress Note (Signed)
 Pt arrived to unit unresponsive and Hypertensive.  No report was given.

## 2024-06-10 NOTE — ED Notes (Signed)
 PT will not leave pulse ox monitoring on. Will allow to get a reading then asks to have it removed.

## 2024-06-10 NOTE — Progress Notes (Signed)
 PHARMACY - PHYSICIAN COMMUNICATION CRITICAL VALUE ALERT - BLOOD CULTURE IDENTIFICATION (BCID)  Frank Moss is an 53 y.o. male who presented to [facility] on 06/09/2024 with a chief complaint of AMS.  Assessment:  1 of 4 bottles MRSE, possibly contaminant  Name of physician (or Provider) Contacted: Lynwood Kipper, NP  Current antibiotics: ceftriaxone  and doxycycline   Changes to prescribed antibiotics recommended:  Recommendations accepted by provider - no additional treatment needed at this time.  Results for orders placed or performed during the hospital encounter of 06/09/24  Blood Culture ID Panel (Reflexed) (Collected: 06/09/2024 12:07 AM)  Result Value Ref Range   Enterococcus faecalis NOT DETECTED NOT DETECTED   Enterococcus Faecium NOT DETECTED NOT DETECTED   Listeria monocytogenes NOT DETECTED NOT DETECTED   Staphylococcus species DETECTED (A) NOT DETECTED   Staphylococcus aureus (BCID) NOT DETECTED NOT DETECTED   Staphylococcus epidermidis DETECTED (A) NOT DETECTED   Staphylococcus lugdunensis NOT DETECTED NOT DETECTED   Streptococcus species NOT DETECTED NOT DETECTED   Streptococcus agalactiae NOT DETECTED NOT DETECTED   Streptococcus pneumoniae NOT DETECTED NOT DETECTED   Streptococcus pyogenes NOT DETECTED NOT DETECTED   A.calcoaceticus-baumannii NOT DETECTED NOT DETECTED   Bacteroides fragilis NOT DETECTED NOT DETECTED   Enterobacterales NOT DETECTED NOT DETECTED   Enterobacter cloacae complex NOT DETECTED NOT DETECTED   Escherichia coli NOT DETECTED NOT DETECTED   Klebsiella aerogenes NOT DETECTED NOT DETECTED   Klebsiella oxytoca NOT DETECTED NOT DETECTED   Klebsiella pneumoniae NOT DETECTED NOT DETECTED   Proteus species NOT DETECTED NOT DETECTED   Salmonella species NOT DETECTED NOT DETECTED   Serratia marcescens NOT DETECTED NOT DETECTED   Haemophilus influenzae NOT DETECTED NOT DETECTED   Neisseria meningitidis NOT DETECTED NOT DETECTED   Pseudomonas  aeruginosa NOT DETECTED NOT DETECTED   Stenotrophomonas maltophilia NOT DETECTED NOT DETECTED   Candida albicans NOT DETECTED NOT DETECTED   Candida auris NOT DETECTED NOT DETECTED   Candida glabrata NOT DETECTED NOT DETECTED   Candida krusei NOT DETECTED NOT DETECTED   Candida parapsilosis NOT DETECTED NOT DETECTED   Candida tropicalis NOT DETECTED NOT DETECTED   Cryptococcus neoformans/gattii NOT DETECTED NOT DETECTED   Methicillin resistance mecA/C DETECTED (A) NOT DETECTED    Rocky Slade, PharmD, BCPS 06/10/2024  7:23 PM

## 2024-06-10 NOTE — Progress Notes (Signed)
 Bilateral lower extremity venous duplex has been completed. Preliminary results can be found in CV Proc through chart review.   06/10/24 2:25 PM Cathlyn Collet RVT

## 2024-06-10 NOTE — ED Notes (Signed)
 PT eating.

## 2024-06-10 NOTE — Evaluation (Signed)
 Clinical/Bedside Swallow Evaluation Patient Details  Name: Frank Moss MRN: [id number] Date of Birth: Oct 22, 1970  Today's Date: 06/10/2024 Time: SLP Start Time (ACUTE ONLY): 1335 SLP Stop Time (ACUTE ONLY): 1350 SLP Time Calculation (min) (ACUTE ONLY): 15 min  Past Medical History:  Past Medical History:  Diagnosis Date   Abnormal ECG    Acute ischemic right ACA stroke (HCC)    Coronary artery disease, occlusive    Dysfunction of eustachian tube    History of right ACA stroke    Hyperlipidemia associated with type 2 diabetes mellitus (HCC)    Hypertension    Impotence of organic origin    Lumbago    Lymphadenitis    Unspecified, except mesenteric   Type 2 diabetes, uncontrolled, with retinopathy    Urinary retention 01/2017   Past Surgical History:  Past Surgical History:  Procedure Laterality Date   TEE WITHOUT CARDIOVERSION N/A 02/11/2017   Procedure: TRANSESOPHAGEAL ECHOCARDIOGRAM (TEE);  Surgeon: Shlomo Wilbert SAUNDERS, MD;  Location: [facility] OR;  Service: Cardiovascular;  Laterality: N/A;   TEE WITHOUT CARDIOVERSION N/A 02/11/2017   Procedure: TRANSESOPHAGEAL ECHOCARDIOGRAM (TEE);  Surgeon: Shlomo Wilbert SAUNDERS, MD;  Location: [facility] ENDOSCOPY;  Service: Cardiovascular;  Laterality: N/A;   HPI:  Frank Moss is a 53 year old male admitted from SNF with episode of unresponsiveness. At baseline he has memory deficits but is able to talk/intereact. Work up revealed concern for left sided lower lobe consolodation, possible CAP. Per MD notes  Question the possibility of aspiration/pneumonia as patient has left-sided hemiparesis, but also could be atelectasis related with the hemiparesis from prior stroke. 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, hemineglect, and dysphagia CAD, hyperlipidemia, hypertension, and diabetes mellitus type II    Assessment / Plan / Recommendation  Clinical Impression  Bedside swallow evaluation complete. Patient presents with a mild  oral dysphagia characterized by baseline left sided facial and lingual weakness which when combined with impulsivity during self feeding and decreased awareness results in mild oral residuals post swallow. Note that patient had just completed his meal when SLP arrived. Mild oral residuals remained in oral cavity requiring liquid wash to clear. Again these residuals were seen following clinician provided regular solid po trials however patient without overt s/s of aspiration across consistencies and does clear bolus with cueing and liquid wash. Attempted to contact both legal guardian and SNF for information on patient's diet prior to admission and potential h/o dysphagia s/p CVA without response. Recommend continuing current diet at this time based on overall performance. SLP will f/u briefly to monitor for tolerance and enforce use of precautions given mild oral dysphagia and possible PNA on admission. Recommend f/u SLP contact legal guardian and/or SNF next business day for more information regarding baseline in order to determine if instrumental testing is beneficial during this admission. SLP Visit Diagnosis: Dysphagia, oral phase (R13.11)    Aspiration Risk  Mild aspiration risk    Diet Recommendation Regular;Thin liquid    Liquid Administration via: Cup;Straw Medication Administration: Whole meds with liquid Supervision: Patient able to self feed;Intermittent supervision to cue for compensatory strategies Compensations: Slow rate;Small sips/bites;Lingual sweep for clearance of pocketing Postural Changes: Seated upright at 90 degrees    Other  Recommendations Oral Care Recommendations: Oral care BID        Functional Status Assessment Patient has had a recent decline in their functional status and demonstrates the ability to make significant improvements in function in a reasonable and predictable amount of time.  Frequency and Duration min 1 x/week  1 week       Prognosis Barriers to  Reach Goals: Cognitive deficits      Swallow Study   General HPI: Frank Moss is a 53 year old male admitted from SNF with episode of unresponsiveness. At baseline he has memory deficits but is able to talk/intereact. Work up revealed concern for left sided lower lobe consolodation, possible CAP. Per MD notes  Question the possibility of aspiration/pneumonia as patient has left-sided hemiparesis, but also could be atelectasis related with the hemiparesis from prior stroke. 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, hemineglect, and dysphagia CAD, hyperlipidemia, hypertension, and diabetes mellitus type II Type of Study: Bedside Swallow Evaluation Previous Swallow Assessment: Patient seen in 2018 while inpatient at [facility], recommended a regular diet Diet Prior to this Study: Regular;Thin liquids (Level 0) Temperature Spikes Noted: No Respiratory Status: Room air History of Recent Intubation: No Behavior/Cognition: Alert;Cooperative;Pleasant mood;Requires cueing;Impulsive;Distractible Oral Cavity Assessment: Other (comment) (small amount of food from lunch in oral cavity) Oral Care Completed by SLP: No Oral Cavity - Dentition: Missing dentition Vision: Functional for self-feeding Self-Feeding Abilities: Able to feed self Patient Positioning: Upright in bed Baseline Vocal Quality: Normal Volitional Cough: Strong Volitional Swallow: Able to elicit    Oral/Motor/Sensory Function Overall Oral Motor/Sensory Function: Moderate impairment Facial ROM: Reduced left;Suspected CN VII (facial) dysfunction Facial Symmetry: Abnormal symmetry left;Suspected CN VII (facial) dysfunction Facial Strength: Reduced left;Suspected CN VII (facial) dysfunction Facial Sensation: Within Functional Limits Lingual ROM: Reduced left;Suspected CN XII (hypoglossal) dysfunction Lingual Symmetry: Abnormal symmetry left;Suspected CN XII (hypoglossal) dysfunction Lingual Strength:  Reduced;Suspected CN XII (hypoglossal) dysfunction Lingual Sensation: Within Functional Limits Velum: Within Functional Limits Mandible: Within Functional Limits   Ice Chips Ice chips: Not tested   Thin Liquid Thin Liquid: Within functional limits Presentation: Cup;Self Fed;Straw    Nectar Thick Nectar Thick Liquid: Not tested   Honey Thick Honey Thick Liquid: Not tested   Puree Puree: Within functional limits Presentation: Spoon   Solid     Solid: Impaired Presentation: Self Fed Oral Phase Functional Implications: Other (comment) (mild oral residue)     Frank Valeri MA, CCC-SLP  Frank Moss Frank Moss 06/10/2024,2:53 PM

## 2024-06-10 NOTE — ED Notes (Signed)
 Lunch tray ordered

## 2024-06-10 NOTE — ED Notes (Signed)
 Pt bedding and gown changed after eating. Dry brief. Pt moved up in bed and resting comfortably.

## 2024-06-10 NOTE — H&P (Signed)
 History and Physical    Patient: Frank Moss FMW:[id number] DOB: 11/26/1970 DOA: 06/09/2024 DOS: the patient was seen and examined on 06/10/2024 PCP: Cicero Aureliano SAUNDERS, MD  Patient coming from: [facility] via EMS  Chief Complaint:  Chief Complaint  Patient presents with   Altered Mental Status   HPI: Frank Moss is a 53 y.o. 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  He experienced an episode of unresponsiveness while at [employer] witnessed by staff.  Reported that he was not responding to verbal or painful stimuli prior to them calling EMS.  At baseline, he has memory issues but is typically able to talk and interact.  The patient no chest pain, discomfort, shortness of breath, cough, nausea, vomiting, abdominal pain, dysuria, or diarrhea. He was transported for further evaluation.  In the emergency department patient was noted to be afebrile with blood pressures elevated up to 173/89, and all other vital signs vital signs relatively maintained.  Labs significant for hemoglobin 8.1, potassium 3, BUN 16, creatinine 1.69, calcium  7.4 with ionized calcium  0.95, magnesium  1.2, ammonia level 28, and lactic acid 0.9.  Venous pH was noted to be 7.497 with pCO2 40.6.  Chest x-ray noted left lower lobe consolidation.  Influenza, COVID-19, and RSV screening were negative.  Urinalysis did not show any significant signs for infection.  UDS was negative.  Blood cultures were obtained.  CT angiogram of the head and neck noted no large vessel occlusion and chronic ischemic white matter changes in the right PCA territory and moderate bilateral V4 vertebral artery atherosclerotic stenosis.  Patient had been given potassium chloride  30 mEq IV, magnesium  sulfate 2 g IV, Rocephin , and  azithromycin .  Review of Systems: As mentioned in the history of present illness. All other systems reviewed and are negative. Past Medical History:  Diagnosis Date   Abnormal ECG    Acute ischemic right ACA stroke (HCC)    Coronary artery disease, occlusive    Dysfunction of eustachian tube    History of right ACA stroke    Hyperlipidemia associated with type 2 diabetes mellitus (HCC)    Hypertension    Impotence of organic origin    Lumbago    Lymphadenitis    Unspecified, except mesenteric   Type 2 diabetes, uncontrolled, with retinopathy    Urinary retention 01/2017   Past Surgical History:  Procedure Laterality Date   TEE WITHOUT CARDIOVERSION N/A 02/11/2017   Procedure: TRANSESOPHAGEAL ECHOCARDIOGRAM (TEE);  Surgeon: Shlomo Wilbert SAUNDERS, MD;  Location: [facility] OR;  Service: Cardiovascular;  Laterality: N/A;   TEE WITHOUT CARDIOVERSION N/A 02/11/2017   Procedure: TRANSESOPHAGEAL ECHOCARDIOGRAM (TEE);  Surgeon: Shlomo Wilbert SAUNDERS, MD;  Location: [facility] ENDOSCOPY;  Service: Cardiovascular;  Laterality: N/A;   Social History:  reports that he has never smoked. He has never used smokeless tobacco. He reports that he does not drink alcohol and does not use drugs.  Allergies  Allergen Reactions   Beta Adrenergic Blockers Other (See Comments)    Fatigue, but this isn't noted on the patient's MAR    Family History  Problem Relation Age of Onset   Hypertension Mother    Depression Mother    Cancer Neg Hx    Diabetes Neg Hx    Stroke Neg Hx     Prior to Admission medications   Medication Sig Start Date End Date Taking? Authorizing  Provider  amLODipine  (NORVASC ) 10 MG tablet Take 1 tablet (10 mg total) by mouth daily. 03/10/17  Yes Angiulli, Toribio PARAS, PA-C  atorvastatin  (LIPITOR ) 20 MG tablet Take 20 mg by mouth at bedtime. 05/29/24  Yes [provider]  chlorhexidine  (PERIDEX ) 0.12 % solution Use as directed 15 mLs in the mouth or throat 2 (two) times daily.   Yes [provider]  cholecalciferol (VITAMIN D3) 25 MCG (1000 UNIT) tablet Take 1,000 Units by mouth daily.   Yes [provider]  divalproex  (DEPAKOTE ) 125 MG DR tablet Take 125 mg by mouth at bedtime. 06/03/24  Yes [provider]  hydrALAZINE  (APRESOLINE ) 100 MG tablet Take 100 mg by mouth 3 (three) times daily. 03/20/24  Yes [provider]  INGREZZA  60 MG capsule Take 60 mg by mouth every evening. 06/04/24  Yes [provider]  ketoconazole  (NIZORAL ) 2 % shampoo Apply 1 Application topically every Monday, Wednesday, and Friday. Apply topically to head, scalp, beard   Yes [provider]  lactulose  (CHRONULAC ) 10 GM/15ML solution Take 10 g by mouth every other day. 04/25/24  Yes [provider]  losartan  (COZAAR ) 25 MG tablet Take 25 mg by mouth daily. 05/31/24  Yes [provider]  Multiple Vitamin (MULTIVITAMIN) tablet Take 1 tablet by mouth daily.   Yes [provider]  Multiple Vitamins-Iron (MULTIVITAMIN PLUS IRON ADULT) TABS Take 1 tablet by mouth daily.   Yes [provider]  ondansetron (ZOFRAN) 4 MG tablet Take 4 mg by mouth every 6 (six) hours as needed for nausea. 05/27/24  Yes [provider]  oxyCODONE -acetaminophen  (PERCOCET/ROXICET) 5-325 MG tablet Take 1 tablet by mouth daily as needed (for wound related pain). 04/25/24  Yes [provider]  pantoprazole  (PROTONIX ) 40 MG tablet Take 40 mg by mouth daily. 05/30/24  Yes [provider]  polyethylene glycol (MIRALAX  / GLYCOLAX ) 17 g packet Take 17 g by mouth daily.   Yes [provider]  Probiotic Product (ACIDOPHILUS, PROBIOTIC, PO) Take 1 capsule by mouth daily.   Yes [provider]  PROCRIT 10000 UNIT/ML injection Inject 10,000 Units into the skin every Wednesday. 05/29/24  Yes [provider]  senna-docusate (SENOKOT-S) 8.6-50 MG tablet Take 2 tablets by mouth 2 (two) times daily.   Yes [provider]  sertraline  (ZOLOFT ) 25 MG tablet Take 75 mg by mouth daily. 05/31/24  Yes [provider]  tamsulosin  (FLOMAX ) 0.4 MG CAPS capsule Take 0.4 mg by mouth at bedtime. 06/03/24  Yes [provider]  traMADol (ULTRAM) 50 MG tablet Take 50 mg by mouth 3 (three) times daily. 06/08/24  Yes [provider]  nitroGLYCERIN (NITROSTAT) 0.4 MG SL tablet Place 0.4 mg under the tongue every 5 (five) minutes as needed for chest pain. Patient not taking: Reported on 06/10/2024    [provider]    Physical Exam: Vitals:   06/10/24 0300 06/10/24 0400 06/10/24 0600 06/10/24 0718  BP: (!) 164/80 (!) 168/101 (!) 171/87 (!) 173/89  Pulse: 87   88  Resp: 16 14 14 13   Temp:  (!) 97 F (36.1 C)  98.1 F (36.7 C)  TempSrc:    Oral  SpO2: 99% 100% 97% 100%   Constitutional: Obese male currently in no acute distress Eyes: PERRL, lids and conjunctivae normal ENMT: Mucous membranes are moist.   Neck: normal, supple, no masses, no thyromegaly Respiratory: clear to auscultation bilaterally, no wheezing, no crackles. Normal respiratory effort. No accessory muscle use.  Cardiovascular: Regular rate and rhythm, no murmurs / rubs / gallops. No extremity edema. 2+ pedal pulses. No carotid bruits.  Abdomen: no tenderness, no masses palpated.  Bowel sounds positive.  Musculoskeletal: no clubbing / cyanosis.  Left foot currently in brace. Skin: no rashes, lesions, ulcers. No induration Neurologic: CN 2-12 grossly intact.  Left-sided weakness present with hemineglect  Psychiatric: Alert and oriented to person and place. Normal mood.   Data Reviewed:  EKG reveals sinus rhythm at 92 beats per minute with QTc 494 with similar appearance to prior.  Reviewed labs, imaging, and pertinent records as documented.  Assessment and Plan:  Possible community-acquired pneumonia Patient presented after being found to be unresponsive.  Patient denies any such complaints of cough,  nausea, vomiting, diarrhea but unclear the severity of cognitive deficits from prior stroke.  Workup revealed concern for left-sided lower lobe consolidation.  Question the possibility of aspiration/pneumonia as patient has left-sided hemiparesis, but also could be atelectasis related with the hemiparesis from prior stroke.  Patient had been started on empiric antibiotics of Rocephin  and azithromycin . - Admit to telemetry bed - Aspiration precautions with elevation head of bed - Continue oxygen as needed to maintain O2 saturation greater than 92% - Incentive spirometry and flutter valve - Follow-up blood cultures - Check procalcitonin, urine Legionella, urine strep - Continue Rocephin  and azithromycin  changed to doxycycline  due to prolonged QT - Mucinex   Unresponsiveness Patient was noted to have ammonia level within normal limits.  UDS negative and alcohol level undetectable.  Glucose was noted to be at the lower limits of normal at 83.  On the differential includes the possibility of patient being in a deep sleep, PRES, arrhythmia, seizure, blood clot, or other. - Neurochecks - Check D-dimer and Depakote  level - Follow-up telemetry - May warrant further workup including such things as EEG if symptoms were to recur  Normocytic anemia Possibly acute on chronic.  Hemoglobin noted to be 8.1 with elevated RDW.  No recent blood counts to compare with hemoglobin noted to be around 11 back in 2020.  This may not be new as patient appears to be on Procrit injections every Wednesday. - Type and screen for possible need of blood product - Recheck H&H - Check stool guaiac  Elevated troponin Acute.  Troponin noted to be 22-> 21.  Patient denies any complaints of pain. - Continue to monitor  Prolonged QT interval Acute.  QTc noted to be 494. - Avoid QT prolonging medication - Correct electrolyte abnormalities  Essential hypertension Blood pressures initially elevated up to 173/89. - Continue  amlodipine , hydralazine , and losartan  as tolerated  Hypokalemia Hypocalcemia Hypomagnesemia Acute.  Calcium  noted to be 7.4 with ionized calcium  0.95 and magnesium  1.2.  Patient was given potassium chloride  30 mEq IV and magnesium  sulfate 2 g IV. - Give calcium  gluconate 2 grams IV - Give additional potassium chloride  20 mEq p.o.  Diabetes mellitus type 2, without long-term use of insulin  On admission glucose noted to be 83. - Hypoglycemia protocols - Follow-up hemoglobin A1c - CBGs before every meal with very sensitive SSI  History of CVA Hyperlipidemia Patient with a history of ischemic right ACA stroke with residual left-sided weakness with hemineglect of the left side from 2018. - Continue atorvastatin  - PT/OT/speech therapy to evaluate   Cognitive deficits related to prior stroke Mood disorder Patient has residual cognitive issues related to prior stroke for which facility reported memory deficits. - Delirium precautions - Continue Ingrezza , Depakote , and Zoloft   GERD -  Continue Protonix   DVT prophylaxis: Lovenox  scheduled for 2200, but plan to discontinue if hemoglobin seen to drop  Advance Care Planning:   Code Status: Full Code    Consults: None Family Communication: Left message for guardian to contact hospital.  Severity of Illness: The appropriate patient status for this patient is INPATIENT. Inpatient status is judged to be reasonable and necessary in order to provide the required intensity of service to ensure the patient's safety. The patient's presenting symptoms, physical exam findings, and initial radiographic and laboratory data in the context of their chronic comorbidities is felt to place them at high risk for further clinical deterioration. Furthermore, it is not anticipated that the patient will be medically stable for discharge from the hospital within 2 midnights of admission.   * I certify that at the point of admission it is my clinical judgment that  the patient will require inpatient hospital care spanning beyond 2 midnights from the point of admission due to high intensity of service, high risk for further deterioration and high frequency of surveillance required.*  Author: Maximino DELENA Sharps, MD 06/10/2024 8:02 AM  For on call review [email or web address].

## 2024-06-10 NOTE — ED Notes (Signed)
 CCMD called. Pt on monitor

## 2024-06-11 ENCOUNTER — Inpatient Hospital Stay (HOSPITAL_COMMUNITY)

## 2024-06-11 DIAGNOSIS — I10 Essential (primary) hypertension: Secondary | ICD-10-CM | POA: Diagnosis not present

## 2024-06-11 DIAGNOSIS — I6930 Unspecified sequelae of cerebral infarction: Secondary | ICD-10-CM | POA: Diagnosis not present

## 2024-06-11 DIAGNOSIS — J189 Pneumonia, unspecified organism: Secondary | ICD-10-CM | POA: Diagnosis not present

## 2024-06-11 DIAGNOSIS — R569 Unspecified convulsions: Secondary | ICD-10-CM

## 2024-06-11 DIAGNOSIS — E119 Type 2 diabetes mellitus without complications: Secondary | ICD-10-CM | POA: Diagnosis not present

## 2024-06-11 LAB — BASIC METABOLIC PANEL WITH GFR
Anion gap: 9 (ref 5–15)
BUN: 20 mg/dL (ref 6–20)
CO2: 31 mmol/L (ref 22–32)
Calcium: 7.9 mg/dL — ABNORMAL LOW (ref 8.9–10.3)
Chloride: 100 mmol/L (ref 98–111)
Creatinine, Ser: 1.85 mg/dL — ABNORMAL HIGH (ref 0.61–1.24)
GFR, Estimated: 42 mL/min — ABNORMAL LOW (ref >60)
Glucose, Bld: 130 mg/dL — ABNORMAL HIGH (ref 70–99)
Potassium: 3.5 mmol/L (ref 3.5–5.1)
Sodium: 140 mmol/L (ref 135–145)

## 2024-06-11 LAB — CBC
HCT: 26.9 % — ABNORMAL LOW (ref 39.0–52.0)
Hemoglobin: 8.8 g/dL — ABNORMAL LOW (ref 13.0–17.0)
MCH: 28.6 pg (ref 26.0–34.0)
MCHC: 32.7 g/dL (ref 30.0–36.0)
MCV: 87.3 fL (ref 80.0–100.0)
Platelets: 404 K/uL — ABNORMAL HIGH (ref 150–400)
RBC: 3.08 MIL/uL — ABNORMAL LOW (ref 4.22–5.81)
RDW: 16.1 % — ABNORMAL HIGH (ref 11.5–15.5)
WBC: 7.7 K/uL (ref 4.0–10.5)
nRBC: 0 % (ref 0.0–0.2)

## 2024-06-11 LAB — GLUCOSE, CAPILLARY
Glucose-Capillary: 125 mg/dL — ABNORMAL HIGH (ref 70–99)
Glucose-Capillary: 125 mg/dL — ABNORMAL HIGH (ref 70–99)

## 2024-06-11 LAB — MAGNESIUM: Magnesium: 1.4 mg/dL — ABNORMAL LOW (ref 1.7–2.4)

## 2024-06-11 MED ORDER — FOLIC ACID 1 MG PO TABS
1.0000 mg | ORAL_TABLET | Freq: Every day | ORAL | Status: DC
  Administered 2024-06-11 – 2024-06-13 (×3): 1 mg via ORAL
  Filled 2024-06-11 (×3): qty 1

## 2024-06-11 MED ORDER — MAGNESIUM SULFATE 4 GM/100ML IV SOLN
4.0000 g | Freq: Once | INTRAVENOUS | Status: AC
  Administered 2024-06-11: 4 g via INTRAVENOUS
  Filled 2024-06-11 (×2): qty 100

## 2024-06-11 NOTE — Progress Notes (Signed)
 Speech Language Pathology Treatment: Dysphagia  Patient Details Name: Frank Moss MRN: [id number] DOB: March 25, 1971 Today's Date: 06/11/2024 Time: 8661-8647 SLP Time Calculation (min) (ACUTE ONLY): 14 min  Assessment / Plan / Recommendation Clinical Impression  Pt presents with a grossly functional oropharyngeal swallow per clinical swallow re-assessment with no overt or subtle s/s of aspiration observed across all trials (>15 oz of thin liquids, 4 oz of applesauce, and PB crackers x3). Pt continues to eat impulsively and benefits from feeding assistance to slow pace in addition to needing feeding assistance at baseline. Recommend continue regular diet and thin liquids as tolerated. No further SLP needs identified at this time. SLP will sign off. Please re-consult if pt exhibits concerns for aspiration with PO intake.    HPI HPI: Frank Moss is a 53 year old male admitted from SNF with episode of unresponsiveness. At baseline he has memory deficits but is able to talk/intereact. Work up revealed concern for left sided lower lobe consolodation, possible CAP. Per MD notes  Question the possibility of aspiration/pneumonia as patient has left-sided hemiparesis, but also could be atelectasis related with the hemiparesis from prior stroke. 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, hemineglect, and dysphagia CAD, hyperlipidemia, hypertension, and diabetes mellitus type II      SLP Plan  Discharge SLP treatment due to (comment);All goals met          Recommendations  Diet recommendations: Regular;Thin liquid Liquids provided via: Straw Medication Administration: Whole meds with liquid Supervision: Staff to assist with self feeding Compensations: Slow rate;Small sips/bites;Lingual sweep for clearance of pocketing Postural Changes and/or Swallow Maneuvers: Seated upright 90 degrees                  Oral care BID           Discharge SLP treatment  due to (comment);All goals met     Frank Moss  06/11/2024, 3:24 PM

## 2024-06-11 NOTE — Procedures (Signed)
 Patient Name: Cree Kunert  MRN: [id number]  Epilepsy Attending: Pastor Falling  Referring Physician/Provider: No ref. provider found      Date: 06/11/2024 Duration: 22 minutes   Patient history: 53 year old man admitted for pneumonia and altered mental status. EEG to evaluate for seizure  Level of alertness: Awake, drowsy  AEDs during EEG study: None   Technical aspects: This EEG study was done with scalp electrodes positioned according to the 10-20 International system of electrode placement. Electrical activity was reviewed with band pass filter of 1-70Hz , sensitivity of 7 uV/mm, display speed of 23mm/sec with a 60Hz  notched filter applied as appropriate. EEG data were recorded continuously and digitally stored.  Video monitoring was available and reviewed as appropriate.  Description: Technically very difficult EEG to read as patient has constant and severe bruxism which interfere with the EEG background due to muscle artifact. A PDR was not clearly seen. Hyperventilation and photic stimulation were not performed.      IMPRESSION: Technically difficulty EEG due to increase muscle artifact from patient severe bruxism. With the limitations, the background is continuous, symmetric and no seizures or epileptiform discharges were seen throughout the recording.   Pastor Falling MD Neurology

## 2024-06-11 NOTE — Progress Notes (Signed)
 PROGRESS NOTE    Frank Moss  FMW:[id number] DOB: Dec 06, 1970 DOA: 06/09/2024 PCP: Cicero Aureliano SAUNDERS, MD   Brief Narrative: Frank Moss is a 53 y.o. male with a history of CVA with residual left-sided weakness, hemineglect, dysphagia, CAD, hyperlipidemia, hypertension, diabetes mellitus type 2, depression, vascular dementia.  Patient presented secondary to altered mental status of unclear etiology. Mental status improved to baseline. Associated electrolyte abnormalities.   Assessment and Plan:  Possible community acquired pneumonia Chest x-ray on admission is significant for a left lower lobe consolidation, possibly indicating pneumonia. Associated small left pleural effusion noted as well. Concern for possible pneumonia etiology. Patient started empirically on Ceftriaxone  and azithromycin  for CAP coverage. -Continue Ceftriaxone  and azithromycin   Unresponsiveness Unclear etiology. Patient significantly altered on arrival to the ED with a documented GCS of 3. Mental status has improved. Workup unremarkable for etiology. Patient has a history of a right ACA stroke. -Check an EEG  Normocytic anemia Stable.  CKD stage 3b Stable.  Likely demand ischemia  Folate deficiency Unclear cause.  Primary hypertension -Continue amlodipine , hydralazine  and losartan   Prolonged QTc Borderline. Patient is on valbenazine  chronically. QTc of 479 msec. -Avoid QTc prolonging medications as able -Replete electrolytes  Hypokalemia Resolved with repletion.  Hypocalcemia Improved with IV calcium .  Hypomagnesemia Patient given magnesium  sulfate. Still with hypomagnesemia -Magnesium  sulfate IV -Recheck magnesium  in AM  Diabetes mellitus type 2 Well controlled based on hemoglobin A1C of 4.0%. -Discontinue SSI and CBGs -Carb modified diet  History of CVA Noted. -Continue Lipitor   Hyperlipidemia -Continue Lipitor   Vertebral artery stenosis Noted on CT angio of the head and  neck, measured as moderate and bilateral.  Depression -Continue Zoloft     DVT prophylaxis: Lovenox  Code Status:   Code Status: Full Code Family Communication: Called patient's legal guardian, however no reply Disposition Plan: Discharge back to facility likely in 1-2 days pending stable mental status and improvement of electrolytes   Consultants:  None  Procedures:  None  Antimicrobials: Ceftriaxone  Azithromycin     Subjective: Patient reports no issues this morning.  Objective: BP (!) 183/109 (BP Location: Left Arm)   Pulse (!) 101   Temp 98 F (36.7 C)   Resp 17   SpO2 98%   Examination:  General exam: Appears calm and comfortable. Respiratory system: Clear to auscultation. Respiratory effort normal. Cardiovascular system: S1 & S2 Maack, RRR. No murmur. Gastrointestinal system: Abdomen is nondistended, soft and nontender. Normal bowel sounds Dack. Central nervous system: Alert and oriented to person and place. Musculoskeletal: No edema. No calf tenderness   Data Reviewed: I have personally reviewed following labs and imaging studies  CBC Lab Results  Component Value Date   WBC 7.7 06/11/2024   RBC 3.08 (L) 06/11/2024   HGB 8.8 (L) 06/11/2024   HCT 26.9 (L) 06/11/2024   MCV 87.3 06/11/2024   MCH 28.6 06/11/2024   PLT 404 (H) 06/11/2024   MCHC 32.7 06/11/2024   RDW 16.1 (H) 06/11/2024   LYMPHSABS 1.6 06/09/2024   MONOABS 0.5 06/09/2024   EOSABS 0.2 06/09/2024   BASOSABS 0.1 06/09/2024     Last metabolic panel Lab Results  Component Value Date   NA 140 06/11/2024   K 3.5 06/11/2024   CL 100 06/11/2024   CO2 31 06/11/2024   BUN 20 06/11/2024   CREATININE 1.85 (H) 06/11/2024   GLUCOSE 130 (H) 06/11/2024   GFRNONAA 42 (L) 06/11/2024   GFRAA 47 (L) 04/23/2019   CALCIUM  7.9 (L) 06/11/2024   PROT 6.0 (L)  06/09/2024   ALBUMIN 2.3 (L) 06/09/2024   BILITOT 0.4 06/09/2024   ALKPHOS 69 06/09/2024   AST 15 06/09/2024   ALT 11 06/09/2024    ANIONGAP 9 06/11/2024    GFR: CrCl cannot be calculated (Unknown ideal weight.).  Recent Results (from the past 240 hours)  Blood culture (routine x 2)     Status: None (Preliminary result)   Collection Time: 06/09/24 12:07 AM   Specimen: BLOOD LEFT ARM  Result Value Ref Range Status   Specimen Description BLOOD LEFT ARM  Final   Special Requests   Final    BOTTLES DRAWN AEROBIC AND ANAEROBIC Blood Culture adequate volume   Culture  Setup Time   Final    GRAM POSITIVE COCCI IN BOTH AEROBIC AND ANAEROBIC BOTTLES CRITICAL RESULT CALLED TO, READ BACK BY AND VERIFIED WITH: PHARMD ROCKY MOHR [id number] AT 1920, ADC Performed at [facility], [address], [place], [place] [address]    Culture GRAM POSITIVE COCCI  Final   Report Status PENDING  Incomplete  Blood Culture ID Panel (Reflexed)     Status: Abnormal   Collection Time: 06/09/24 12:07 AM  Result Value Ref Range Status   Enterococcus faecalis NOT DETECTED NOT DETECTED Final   Enterococcus Faecium NOT DETECTED NOT DETECTED Final   Listeria monocytogenes NOT DETECTED NOT DETECTED Final   Staphylococcus species DETECTED (A) NOT DETECTED Final    Comment: CRITICAL RESULT CALLED TO, READ BACK BY AND VERIFIED WITH: PHARMD ERIN W. [id number] AT 1920, ADC    Staphylococcus aureus (BCID) NOT DETECTED NOT DETECTED Final   Staphylococcus epidermidis DETECTED (A) NOT DETECTED Final    Comment: Methicillin (oxacillin) resistant coagulase negative staphylococcus. Possible blood culture contaminant (unless isolated from more than one blood culture draw or clinical case suggests pathogenicity). No antibiotic treatment is indicated for blood  culture contaminants. CRITICAL RESULT CALLED TO, READ BACK BY AND VERIFIED WITH: PHARMD ERIN W. [id number] AT 1920, ADC    Staphylococcus lugdunensis NOT DETECTED NOT DETECTED Final   Streptococcus species NOT DETECTED NOT DETECTED Final   Streptococcus agalactiae NOT DETECTED NOT DETECTED Final    Streptococcus pneumoniae NOT DETECTED NOT DETECTED Final   Streptococcus pyogenes NOT DETECTED NOT DETECTED Final   A.calcoaceticus-baumannii NOT DETECTED NOT DETECTED Final   Bacteroides fragilis NOT DETECTED NOT DETECTED Final   Enterobacterales NOT DETECTED NOT DETECTED Final   Enterobacter cloacae complex NOT DETECTED NOT DETECTED Final   Escherichia coli NOT DETECTED NOT DETECTED Final   Klebsiella aerogenes NOT DETECTED NOT DETECTED Final   Klebsiella oxytoca NOT DETECTED NOT DETECTED Final   Klebsiella pneumoniae NOT DETECTED NOT DETECTED Final   Proteus species NOT DETECTED NOT DETECTED Final   Salmonella species NOT DETECTED NOT DETECTED Final   Serratia marcescens NOT DETECTED NOT DETECTED Final   Haemophilus influenzae NOT DETECTED NOT DETECTED Final   Neisseria meningitidis NOT DETECTED NOT DETECTED Final   Pseudomonas aeruginosa NOT DETECTED NOT DETECTED Final   Stenotrophomonas maltophilia NOT DETECTED NOT DETECTED Final   Candida albicans NOT DETECTED NOT DETECTED Final   Candida auris NOT DETECTED NOT DETECTED Final   Candida glabrata NOT DETECTED NOT DETECTED Final   Candida krusei NOT DETECTED NOT DETECTED Final   Candida parapsilosis NOT DETECTED NOT DETECTED Final   Candida tropicalis NOT DETECTED NOT DETECTED Final   Cryptococcus neoformans/gattii NOT DETECTED NOT DETECTED Final   Methicillin resistance mecA/C DETECTED (A) NOT DETECTED Final    Comment: CRITICAL RESULT CALLED TO, READ  BACK BY AND VERIFIED WITH: PHARMD ROCKY MOHR [id number] AT 1920, ADC Performed at [facility], [address], [place], [place] [address]   Resp panel by RT-PCR (RSV, Flu A&B, Covid) Anterior Nasal Swab     Status: None   Collection Time: 06/09/24  7:30 PM   Specimen: Anterior Nasal Swab  Result Value Ref Range Status   SARS Coronavirus 2 by RT PCR NEGATIVE NEGATIVE Final   Influenza A by PCR NEGATIVE NEGATIVE Final   Influenza B by PCR NEGATIVE NEGATIVE Final    Comment:  (NOTE) The Xpert Xpress SARS-CoV-2/FLU/RSV plus assay is intended as an aid in the diagnosis of influenza from Nasopharyngeal swab specimens and should not be used as a sole basis for treatment. Nasal washings and aspirates are unacceptable for Xpert Xpress SARS-CoV-2/FLU/RSV testing.  Fact Sheet for Patients: bloggercourse.com  Fact Sheet for Healthcare Providers: seriousbroker.it  This test is not yet approved or cleared by the [place]  FDA and has been authorized for detection and/or diagnosis of SARS-CoV-2 by FDA under an Emergency Use Authorization (EUA). This EUA will remain in effect (meaning this test can be used) for the duration of the COVID-19 declaration under Section 564(b)(1) of the Act, 21 U.S.C. section 360bbb-3(b)(1), unless the authorization is terminated or revoked.     Resp Syncytial Virus by PCR NEGATIVE NEGATIVE Final    Comment: (NOTE) Fact Sheet for Patients: bloggercourse.com  Fact Sheet for Healthcare Providers: seriousbroker.it  This test is not yet approved or cleared by the [place]  FDA and has been authorized for detection and/or diagnosis of SARS-CoV-2 by FDA under an Emergency Use Authorization (EUA). This EUA will remain in effect (meaning this test can be used) for the duration of the COVID-19 declaration under Section 564(b)(1) of the Act, 21 U.S.C. section 360bbb-3(b)(1), unless the authorization is terminated or revoked.  Performed at [facility], [address], [place], [place] [address]   Blood culture (routine x 2)     Status: None (Preliminary result)   Collection Time: 06/09/24 11:16 PM   Specimen: BLOOD LEFT FOREARM  Result Value Ref Range Status   Specimen Description BLOOD LEFT FOREARM  Final   Special Requests   Final    BOTTLES DRAWN AEROBIC AND ANAEROBIC Blood Culture adequate volume   Culture   Final    NO  GROWTH 2 DAYS Performed at [facility], [address] [place], [place] [address]    Report Status PENDING  Incomplete      Radiology Studies: VAS US  LOWER EXTREMITY VENOUS (DVT) Result Date: 06/10/2024  Lower Venous DVT Study Patient Name:  Frank Moss  Date of Exam:   06/10/2024 Medical Rec #: [id number]      Accession #:    [id number] Date of Birth: Jun 09, 1971      Patient Gender: M Patient Age:   22 years Exam Location:  [facility] Procedure:      VAS US  LOWER EXTREMITY VENOUS (DVT) Referring Phys: MAXIMINO SHARPS --------------------------------------------------------------------------------  Indications: Elevated Ddimer.  Risk Factors: None identified. Limitations: Poor ultrasound/tissue interface and patient positioning, patient immobility. Comparison Study: No prior studies. Performing Technologist: Cordella Collet RVT  Examination Guidelines: A complete evaluation includes B-mode imaging, spectral Doppler, color Doppler, and power Doppler as needed of all accessible portions of each vessel. Bilateral testing is considered an integral part of a complete examination. Limited examinations for reoccurring indications may be performed as noted. The reflux portion of the exam is  performed with the patient in reverse Trendelenburg.  +---------+---------------+---------+-----------+----------+--------------+ RIGHT    CompressibilityPhasicitySpontaneityPropertiesThrombus Aging +---------+---------------+---------+-----------+----------+--------------+ CFV      Full           Yes      Yes                                 +---------+---------------+---------+-----------+----------+--------------+ SFJ      Full                                                        +---------+---------------+---------+-----------+----------+--------------+ FV Prox  Full                                                         +---------+---------------+---------+-----------+----------+--------------+ FV Mid   Full                                                        +---------+---------------+---------+-----------+----------+--------------+ FV DistalFull                                                        +---------+---------------+---------+-----------+----------+--------------+ PFV      Full                                                        +---------+---------------+---------+-----------+----------+--------------+ POP      Full           Yes      Yes                                 +---------+---------------+---------+-----------+----------+--------------+ PTV      Full                                                        +---------+---------------+---------+-----------+----------+--------------+ PERO     Full                                                        +---------+---------------+---------+-----------+----------+--------------+   +---------+---------------+---------+-----------+----------+--------------+ LEFT     CompressibilityPhasicitySpontaneityPropertiesThrombus Aging +---------+---------------+---------+-----------+----------+--------------+ CFV      Full           Yes      Yes                                 +---------+---------------+---------+-----------+----------+--------------+  SFJ      Full                                                        +---------+---------------+---------+-----------+----------+--------------+ FV Prox  Full                                                        +---------+---------------+---------+-----------+----------+--------------+ FV Mid   Full                                                        +---------+---------------+---------+-----------+----------+--------------+ FV DistalFull                                                         +---------+---------------+---------+-----------+----------+--------------+ PFV      Full                                                        +---------+---------------+---------+-----------+----------+--------------+ POP      Full           Yes      Yes                                 +---------+---------------+---------+-----------+----------+--------------+ PTV      Full                                                        +---------+---------------+---------+-----------+----------+--------------+ PERO     Full                                                        +---------+---------------+---------+-----------+----------+--------------+    Summary: RIGHT: - There is no evidence of deep vein thrombosis in the lower extremity.  - No cystic structure found in the popliteal fossa.  LEFT: - There is no evidence of deep vein thrombosis in the lower extremity.  - No cystic structure found in the popliteal fossa.  *See table(s) above for measurements and observations.    Preliminary    CT ANGIO HEAD NECK W WO CM Result Date: 06/10/2024 EXAM: CT HEAD WITHOUT CTA HEAD AND NECK WITH AND WITHOUT 06/10/2024 12:01:42 AM TECHNIQUE: CTA of the head and neck was performed with and without the administration of 75 mL of iohexol  (OMNIPAQUE )  350 MG/ML injection. Noncontrast CT of the head with reconstructed 2-D images are also provided for review. Multiplanar 2D and/or 3D reformatted images are provided for review. Automated exposure control, iterative reconstruction, and/or weight based adjustment of the mA/kV was utilized to reduce the radiation dose to as low as reasonably achievable. COMPARISON: 02/08/2017 CLINICAL HISTORY: Neuro deficit, acute, stroke suspected. FINDINGS: CT HEAD: BRAIN AND VENTRICLES: No acute intracranial hemorrhage. No mass effect. No midline shift. No extra-axial fluid collection. No evidence of acute infarct. No hydrocephalus. Chronic ischemic white matter changes.  Old right PCA territory infarct. ORBITS: No acute abnormality. SINUSES AND MASTOIDS: Chronic bilateral maxillary and right frontal sinus disease. CTA NECK: AORTIC ARCH AND ARCH VESSELS: No dissection or arterial injury. No significant stenosis of the brachiocephalic or subclavian arteries. CERVICAL CAROTID ARTERIES: Minimal atherosclerotic calcification of the common carotid arteries, no hemodynamically significant stenosis. No dissection or arterial injury. CERVICAL VERTEBRAL ARTERIES: Atherosclerosis of the V4 segments of both vertebral arteries with moderate stenosis bilaterally. No dissection or arterial injury. LUNGS AND MEDIASTINUM: Small left pleural effusion. SOFT TISSUES: No acute abnormality. BONES: No acute abnormality. CTA HEAD: ANTERIOR CIRCULATION: Calcification of the carotid siphons without hemodynamically significant stenosis. No significant stenosis of the internal carotid arteries. No significant stenosis of the anterior cerebral arteries. No significant stenosis of the middle cerebral arteries. No aneurysm. POSTERIOR CIRCULATION: No significant stenosis of the posterior cerebral arteries. No significant stenosis of the basilar artery. No significant stenosis of the vertebral arteries. No aneurysm. OTHER: No dural venous sinus thrombosis on this non-dedicated study. IMPRESSION: 1. No large vessel occlusion in the head or neck. 2. Chronic ischemic white matter changes and chronic right PCA territory infarct. 3. Moderate bilateral V4 vertebral artery atherosclerotic stenosis. Electronically signed by: Franky Stanford MD 06/10/2024 12:15 AM EST RP Workstation: [id number]   DG Chest Portable 1 View Result Date: 06/09/2024 CLINICAL DATA:  Short of breath EXAM: PORTABLE CHEST 1 VIEW COMPARISON:  02/08/2017 FINDINGS: Single frontal view of the chest demonstrates an unremarkable cardiac silhouette. There is increased density at the left lung base obscuring the left hemidiaphragm, consistent with left  lower lobe consolidation and likely left pleural effusion. Right chest is clear. No pneumothorax. No acute bony abnormalities. IMPRESSION: 1. Left lower lobe consolidation, which may reflect atelectasis or airspace disease. 2. Small left pleural effusion. Electronically Signed   By: Ozell Daring M.D.   On: 06/09/2024 19:17      LOS: 1 day    Elgin Lam, MD [facility] 06/11/2024, 7:31 AM   If 7PM-7AM, please contact night-coverage [email or web address]

## 2024-06-11 NOTE — Progress Notes (Signed)
 PT Cancellation Note  Patient Details Name: Lary Eckardt MRN: [id number] DOB: [birth year]   Cancelled Treatment:    Reason Eval/Treat Not Completed: PT screened, no needs identified, will sign off  Confirmed with LCSW that pt is a long-term resident. Noted he is non-ambulatory and uses a hoyer lift. No skilled PT needs identified. PT is signing off.    Macario RAMAN, PT Acute Rehabilitation Services  Office [phone]  Macario SHAUNNA Soja 06/11/2024, 11:12 AM

## 2024-06-11 NOTE — Hospital Course (Signed)
 Frank Moss is a 53 y.o. male with a history of CVA with residual left-sided weakness, hemineglect, dysphagia, CAD, hyperlipidemia, hypertension, diabetes mellitus type 2, depression, vascular dementia.  Patient presented secondary to altered mental status of unclear etiology. Mental status improved to baseline. Associated electrolyte abnormalities.

## 2024-06-11 NOTE — Progress Notes (Signed)
 EEG complete - results pending

## 2024-06-11 NOTE — Progress Notes (Signed)
 OT Cancellation Note  Patient Details Name: Frank Moss MRN: [id number] DOB: Oct 14, 1970   Cancelled Treatment:    Reason Eval/Treat Not Completed: OT screened, no needs identified, will sign off. Confirmed with LCSW that pt is a long-term resident. Noted he is non-ambulatory and uses a hoyer lift. Discussed with Pt that staff provides total ADL assistance. No acute OT needs identified. OT to sign off.   Maurilio CROME, OTR/LSABRA  [facility]  Office: [phone]   Maurilio PARAS Shantara Goosby 06/11/2024, 12:28 PM

## 2024-06-12 ENCOUNTER — Other Ambulatory Visit: Payer: Self-pay

## 2024-06-12 DIAGNOSIS — E119 Type 2 diabetes mellitus without complications: Secondary | ICD-10-CM | POA: Diagnosis not present

## 2024-06-12 DIAGNOSIS — I6930 Unspecified sequelae of cerebral infarction: Secondary | ICD-10-CM | POA: Diagnosis not present

## 2024-06-12 DIAGNOSIS — I10 Essential (primary) hypertension: Secondary | ICD-10-CM | POA: Diagnosis not present

## 2024-06-12 DIAGNOSIS — J189 Pneumonia, unspecified organism: Secondary | ICD-10-CM | POA: Diagnosis not present

## 2024-06-12 LAB — CULTURE, BLOOD (ROUTINE X 2): Special Requests: ADEQUATE

## 2024-06-12 LAB — MAGNESIUM: Magnesium: 1.9 mg/dL (ref 1.7–2.4)

## 2024-06-12 LAB — BASIC METABOLIC PANEL WITH GFR
Anion gap: 12 (ref 5–15)
BUN: 21 mg/dL — ABNORMAL HIGH (ref 6–20)
CO2: 26 mmol/L (ref 22–32)
Calcium: 7.9 mg/dL — ABNORMAL LOW (ref 8.9–10.3)
Chloride: 99 mmol/L (ref 98–111)
Creatinine, Ser: 1.96 mg/dL — ABNORMAL HIGH (ref 0.61–1.24)
GFR, Estimated: 40 mL/min — ABNORMAL LOW (ref >60)
Glucose, Bld: 130 mg/dL — ABNORMAL HIGH (ref 70–99)
Potassium: 3.3 mmol/L — ABNORMAL LOW (ref 3.5–5.1)
Sodium: 137 mmol/L (ref 135–145)

## 2024-06-12 MED ORDER — POTASSIUM CHLORIDE CRYS ER 20 MEQ PO TBCR
40.0000 meq | EXTENDED_RELEASE_TABLET | Freq: Once | ORAL | Status: AC
Start: 2024-06-12 — End: 2024-06-12
  Administered 2024-06-12: 40 meq via ORAL
  Filled 2024-06-12: qty 2

## 2024-06-12 MED ORDER — SODIUM CHLORIDE 0.9 % IV SOLN: INTRAVENOUS | Status: AC

## 2024-06-12 MED ORDER — LOSARTAN POTASSIUM 50 MG PO TABS
50.0000 mg | ORAL_TABLET | Freq: Every day | ORAL | Status: DC
  Administered 2024-06-12 – 2024-06-13 (×2): 50 mg via ORAL
  Filled 2024-06-12 (×2): qty 1

## 2024-06-12 NOTE — Progress Notes (Signed)
 PROGRESS NOTE    Frank Moss  FMW:[id number] DOB: 1971/04/19 DOA: 06/09/2024 PCP: Cicero Aureliano SAUNDERS, MD   Brief Narrative: Frank Moss is a 53 y.o. male with a history of CVA with residual left-sided weakness, hemineglect, dysphagia, CAD, hyperlipidemia, hypertension, diabetes mellitus type 2, depression, vascular dementia.  Patient presented secondary to altered mental status of unclear etiology. Mental status improved to baseline. Associated electrolyte abnormalities.   Assessment and Plan:  Possible community acquired pneumonia Chest x-ray on admission is significant for a left lower lobe consolidation, possibly indicating pneumonia. Associated small left pleural effusion noted as well. Concern for possible pneumonia etiology. Patient started empirically on Ceftriaxone  and azithromycin  for CAP coverage. -Continue Ceftriaxone  and azithromycin   Unresponsiveness Unclear etiology. Patient significantly altered on arrival to the ED with a documented GCS of 3. Mental status has improved. Workup unremarkable for etiology. Patient has a history of a right ACA stroke. -Check an EEG  Normocytic anemia Stable.  CKD stage 3b Baseline creatinine of 1.8 from 5 years ago. Creatinine of 1.69 on admission and has trended up to 1.96 today. -IV fluids -BMP in AM  Likely demand ischemia  Folate deficiency Unclear cause.  Primary hypertension -Continue amlodipine , hydralazine  and losartan   Prolonged QTc Borderline. Patient is on valbenazine  chronically. QTc of 479 msec. -Avoid QTc prolonging medications as able -Replete electrolytes  Hypokalemia Resolved with repletion.  Hypocalcemia Improved with IV calcium .  Positive blood culture Blood culture significant for staphylococcus epidermidis. Likely contaminant.  Hypomagnesemia Patient given magnesium  sulfate. Still with hypomagnesemia -Magnesium  sulfate IV -Recheck magnesium  in AM  Diabetes mellitus type 2 Well  controlled based on hemoglobin A1C of 4.0%.  History of CVA Noted. -Continue Lipitor   Hyperlipidemia -Continue Lipitor   Vertebral artery stenosis Noted on CT angio of the head and neck, measured as moderate and bilateral.  Depression -Continue Zoloft     DVT prophylaxis: Lovenox  Code Status:   Code Status: Full Code Family Communication: Called patient's legal guardian ([employer]), however no reply (11/25) Disposition Plan: Discharge back to facility likely in 1 day pending improvement of renal function   Consultants:  None  Procedures:  None  Antimicrobials: Ceftriaxone  Azithromycin     Subjective: No concerns. Ate breakfast. Still hungry.  Objective: BP (!) 166/107 (BP Location: Left Arm)   Pulse 89   Temp 97.8 F (36.6 C)   Resp 16   SpO2 100%   Examination:  General exam: Appears calm and comfortable. Respiratory system: Clear to auscultation. Respiratory effort normal. Cardiovascular system: S1 & S2 Hammers, RRR. No murmur. Gastrointestinal system: Abdomen is nondistended, soft and nontender. Normal bowel sounds [place]. Central nervous system: Alert.   Data Reviewed: I have personally reviewed following labs and imaging studies  CBC Lab Results  Component Value Date   WBC 7.7 06/11/2024   RBC 3.08 (L) 06/11/2024   HGB 8.8 (L) 06/11/2024   HCT 26.9 (L) 06/11/2024   MCV 87.3 06/11/2024   MCH 28.6 06/11/2024   PLT 404 (H) 06/11/2024   MCHC 32.7 06/11/2024   RDW 16.1 (H) 06/11/2024   LYMPHSABS 1.6 06/09/2024   MONOABS 0.5 06/09/2024   EOSABS 0.2 06/09/2024   BASOSABS 0.1 06/09/2024     Last metabolic panel Lab Results  Component Value Date   NA 137 06/12/2024   K 3.3 (L) 06/12/2024   CL 99 06/12/2024   CO2 26 06/12/2024   BUN 21 (H) 06/12/2024   CREATININE 1.96 (H) 06/12/2024   GLUCOSE 130 (H) 06/12/2024   GFRNONAA 40 (L) 06/12/2024  GFRAA 47 (L) 04/23/2019   CALCIUM  7.9 (L) 06/12/2024   PROT 6.0 (L) 06/09/2024   ALBUMIN 2.3 (L)  06/09/2024   BILITOT 0.4 06/09/2024   ALKPHOS 69 06/09/2024   AST 15 06/09/2024   ALT 11 06/09/2024   ANIONGAP 12 06/12/2024    GFR: CrCl cannot be calculated (Unknown ideal weight.).  Recent Results (from the past 240 hours)  Blood culture (routine x 2)     Status: Abnormal   Collection Time: 06/09/24 12:07 AM   Specimen: BLOOD LEFT ARM  Result Value Ref Range Status   Specimen Description BLOOD LEFT ARM  Final   Special Requests   Final    BOTTLES DRAWN AEROBIC AND ANAEROBIC Blood Culture adequate volume   Culture  Setup Time   Final    GRAM POSITIVE COCCI IN BOTH AEROBIC AND ANAEROBIC BOTTLES CRITICAL RESULT CALLED TO, READ BACK BY AND VERIFIED WITH: PHARMD ERIN W. [id number] AT 1920, ADC    Culture (A)  Final    STAPHYLOCOCCUS EPIDERMIDIS THE SIGNIFICANCE OF ISOLATING THIS ORGANISM FROM A SINGLE SET OF BLOOD CULTURES WHEN MULTIPLE SETS ARE DRAWN IS UNCERTAIN. PLEASE NOTIFY THE MICROBIOLOGY DEPARTMENT WITHIN ONE WEEK IF SPECIATION AND SENSITIVITIES ARE REQUIRED. Performed at [facility], [address], [place], [place] [address]    Report Status 06/12/2024 FINAL  Final  Blood Culture ID Panel (Reflexed)     Status: Abnormal   Collection Time: 06/09/24 12:07 AM  Result Value Ref Range Status   Enterococcus faecalis NOT DETECTED NOT DETECTED Final   Enterococcus Faecium NOT DETECTED NOT DETECTED Final   Listeria monocytogenes NOT DETECTED NOT DETECTED Final   Staphylococcus species DETECTED (A) NOT DETECTED Final    Comment: CRITICAL RESULT CALLED TO, READ BACK BY AND VERIFIED WITH: PHARMD ERIN W. [id number] AT 1920, ADC    Staphylococcus aureus (BCID) NOT DETECTED NOT DETECTED Final   Staphylococcus epidermidis DETECTED (A) NOT DETECTED Final    Comment: Methicillin (oxacillin) resistant coagulase negative staphylococcus. Possible blood culture contaminant (unless isolated from more than one blood culture draw or clinical case suggests pathogenicity). No antibiotic  treatment is indicated for blood  culture contaminants. CRITICAL RESULT CALLED TO, READ BACK BY AND VERIFIED WITH: PHARMD ERIN W. [id number] AT 1920, ADC    Staphylococcus lugdunensis NOT DETECTED NOT DETECTED Final   Streptococcus species NOT DETECTED NOT DETECTED Final   Streptococcus agalactiae NOT DETECTED NOT DETECTED Final   Streptococcus pneumoniae NOT DETECTED NOT DETECTED Final   Streptococcus pyogenes NOT DETECTED NOT DETECTED Final   A.calcoaceticus-baumannii NOT DETECTED NOT DETECTED Final   Bacteroides fragilis NOT DETECTED NOT DETECTED Final   Enterobacterales NOT DETECTED NOT DETECTED Final   Enterobacter cloacae complex NOT DETECTED NOT DETECTED Final   Escherichia coli NOT DETECTED NOT DETECTED Final   Klebsiella aerogenes NOT DETECTED NOT DETECTED Final   Klebsiella oxytoca NOT DETECTED NOT DETECTED Final   Klebsiella pneumoniae NOT DETECTED NOT DETECTED Final   Proteus species NOT DETECTED NOT DETECTED Final   Salmonella species NOT DETECTED NOT DETECTED Final   Serratia marcescens NOT DETECTED NOT DETECTED Final   Haemophilus influenzae NOT DETECTED NOT DETECTED Final   Neisseria meningitidis NOT DETECTED NOT DETECTED Final   Pseudomonas aeruginosa NOT DETECTED NOT DETECTED Final   Stenotrophomonas maltophilia NOT DETECTED NOT DETECTED Final   Candida albicans NOT DETECTED NOT DETECTED Final   Candida auris NOT DETECTED NOT DETECTED Final   Candida glabrata NOT DETECTED NOT DETECTED Final   Candida  krusei NOT DETECTED NOT DETECTED Final   Candida parapsilosis NOT DETECTED NOT DETECTED Final   Candida tropicalis NOT DETECTED NOT DETECTED Final   Cryptococcus neoformans/gattii NOT DETECTED NOT DETECTED Final   Methicillin resistance mecA/C DETECTED (A) NOT DETECTED Final    Comment: CRITICAL RESULT CALLED TO, READ BACK BY AND VERIFIED WITH: PHARMD ROCKY MOHR [id number] AT 1920, ADC Performed at [facility], [address] [place], [place] [address]   Resp panel by  RT-PCR (RSV, Flu A&B, Covid) Anterior Nasal Swab     Status: None   Collection Time: 06/09/24  7:30 PM   Specimen: Anterior Nasal Swab  Result Value Ref Range Status   SARS Coronavirus 2 by RT PCR NEGATIVE NEGATIVE Final   Influenza A by PCR NEGATIVE NEGATIVE Final   Influenza B by PCR NEGATIVE NEGATIVE Final    Comment: (NOTE) The Xpert Xpress SARS-CoV-2/FLU/RSV plus assay is intended as an aid in the diagnosis of influenza from Nasopharyngeal swab specimens and should not be used as a sole basis for treatment. Nasal washings and aspirates are unacceptable for Xpert Xpress SARS-CoV-2/FLU/RSV testing.  Fact Sheet for Patients: bloggercourse.com  Fact Sheet for Healthcare Providers: seriousbroker.it  This test is not yet approved or cleared by the [place]  FDA and has been authorized for detection and/or diagnosis of SARS-CoV-2 by FDA under an Emergency Use Authorization (EUA). This EUA will remain in effect (meaning this test can be used) for the duration of the COVID-19 declaration under Section 564(b)(1) of the Act, 21 U.S.C. section 360bbb-3(b)(1), unless the authorization is terminated or revoked.     Resp Syncytial Virus by PCR NEGATIVE NEGATIVE Final    Comment: (NOTE) Fact Sheet for Patients: bloggercourse.com  Fact Sheet for Healthcare Providers: seriousbroker.it  This test is not yet approved or cleared by the [place]  FDA and has been authorized for detection and/or diagnosis of SARS-CoV-2 by FDA under an Emergency Use Authorization (EUA). This EUA will remain in effect (meaning this test can be used) for the duration of the COVID-19 declaration under Section 564(b)(1) of the Act, 21 U.S.C. section 360bbb-3(b)(1), unless the authorization is terminated or revoked.  Performed at [facility], [address], [place], [place] [address]   Blood  culture (routine x 2)     Status: None (Preliminary result)   Collection Time: 06/09/24 11:16 PM   Specimen: BLOOD LEFT FOREARM  Result Value Ref Range Status   Specimen Description BLOOD LEFT FOREARM  Final   Special Requests   Final    BOTTLES DRAWN AEROBIC AND ANAEROBIC Blood Culture adequate volume   Culture   Final    NO GROWTH 3 DAYS Performed at [facility], [address], [place], [place] [address]    Report Status PENDING  Incomplete      Radiology Studies: DG CHEST PORT 1 VIEW Result Date: 06/11/2024 CLINICAL DATA:  Shortness of breath. EXAM: PORTABLE CHEST 1 VIEW COMPARISON:  Radiographs 06/09/2024 and 02/08/2017. FINDINGS: 0955 hours. The heart size and mediastinal contours are stable. Persistent left basilar airspace disease and probable left pleural effusion. The right lung appears clear. No evidence of pneumothorax. The bones appear unremarkable. IMPRESSION: Persistent left basilar airspace disease which could reflect pneumonia and probable left pleural effusion. Electronically Signed   By: Elsie Perone M.D.   On: 06/11/2024 13:21   NM Pulmonary Perfusion Result Date: 06/11/2024 CLINICAL DATA:  Elevated D-dimer levels. Altered mental status. Left basilar airspace disease on radiographs.  EXAM: NUCLEAR MEDICINE PERFUSION LUNG SCAN TECHNIQUE: Perfusion images were obtained in multiple projections after intravenous injection of radiopharmaceutical. No ventilation imaging performed. RADIOPHARMACEUTICALS:  4.28 mCi Tc-48m MAA IV COMPARISON:  Radiographs 06/11/2024, 06/09/2024 and 02/08/2017. FINDINGS: Decreased perfusion medially in the left lower lobe, corresponding with airspace disease on radiographs. No wedge-shaped perfusion defects are demonstrated elsewhere to suggest pulmonary embolism. IMPRESSION: 1. Nonspecific decreased perfusion to the left lower lobe with corresponding airspace disease on radiographs. 2. No wedge-shaped perfusion defects are demonstrated  elsewhere to suggest pulmonary embolism. Electronically Signed   By: Elsie Perone M.D.   On: 06/11/2024 13:19   EEG adult Result Date: 06/11/2024 Gregg Lek, MD     06/11/2024 12:50 PM Patient Name: Hutton Pellicane MRN: [id number] Epilepsy Attending: Lek Gregg Referring Physician/Provider: No ref. provider found     Date: 06/11/2024 Duration: 22 minutes Patient history: 53 year old man admitted for pneumonia and altered mental status. EEG to evaluate for seizure Level of alertness: Awake, drowsy AEDs during EEG study: None Technical aspects: This EEG study was done with scalp electrodes positioned according to the 10-20 International system of electrode placement. Electrical activity was reviewed with band pass filter of 1-70Hz , sensitivity of 7 uV/mm, display speed of 71mm/sec with a 60Hz  notched filter applied as appropriate. EEG data were recorded continuously and digitally stored.  Video monitoring was available and reviewed as appropriate. Description: Technically very difficult EEG to read as patient has constant and severe bruxism which interfere with the EEG background due to muscle artifact. A PDR was not clearly seen. Hyperventilation and photic stimulation were not performed.   IMPRESSION: Technically difficulty EEG due to increase muscle artifact from patient severe bruxism. With the limitations, the background is continuous, symmetric and no seizures or epileptiform discharges were seen throughout the recording. Lek Gregg MD Neurology    VAS US  LOWER EXTREMITY VENOUS (DVT) Result Date: 06/11/2024  Lower Venous DVT Study Patient Name:  Delyle Smethurst  Date of Exam:   06/10/2024 Medical Rec #: [id number]      Accession #:    [id number] Date of Birth: 06/04/1971      Patient Gender: M Patient Age:   61 years Exam Location:  [facility] Procedure:      VAS US  LOWER EXTREMITY VENOUS (DVT) Referring Phys: MAXIMINO SHARPS  --------------------------------------------------------------------------------  Indications: Elevated Ddimer.  Risk Factors: None identified. Limitations: Poor ultrasound/tissue interface and patient positioning, patient immobility. Comparison Study: No prior studies. Performing Technologist: Cordella Collet RVT  Examination Guidelines: A complete evaluation includes B-mode imaging, spectral Doppler, color Doppler, and power Doppler as needed of all accessible portions of each vessel. Bilateral testing is considered an integral part of a complete examination. Limited examinations for reoccurring indications may be performed as noted. The reflux portion of the exam is performed with the patient in reverse Trendelenburg.  +---------+---------------+---------+-----------+----------+--------------+ RIGHT    CompressibilityPhasicitySpontaneityPropertiesThrombus Aging +---------+---------------+---------+-----------+----------+--------------+ CFV      Full           Yes      Yes                                 +---------+---------------+---------+-----------+----------+--------------+ SFJ      Full                                                        +---------+---------------+---------+-----------+----------+--------------+  FV Prox  Full                                                        +---------+---------------+---------+-----------+----------+--------------+ FV Mid   Full                                                        +---------+---------------+---------+-----------+----------+--------------+ FV DistalFull                                                        +---------+---------------+---------+-----------+----------+--------------+ PFV      Full                                                        +---------+---------------+---------+-----------+----------+--------------+ POP      Full           Yes      Yes                                  +---------+---------------+---------+-----------+----------+--------------+ PTV      Full                                                        +---------+---------------+---------+-----------+----------+--------------+ PERO     Full                                                        +---------+---------------+---------+-----------+----------+--------------+   +---------+---------------+---------+-----------+----------+--------------+ LEFT     CompressibilityPhasicitySpontaneityPropertiesThrombus Aging +---------+---------------+---------+-----------+----------+--------------+ CFV      Full           Yes      Yes                                 +---------+---------------+---------+-----------+----------+--------------+ SFJ      Full                                                        +---------+---------------+---------+-----------+----------+--------------+ FV Prox  Full                                                        +---------+---------------+---------+-----------+----------+--------------+  FV Mid   Full                                                        +---------+---------------+---------+-----------+----------+--------------+ FV DistalFull                                                        +---------+---------------+---------+-----------+----------+--------------+ PFV      Full                                                        +---------+---------------+---------+-----------+----------+--------------+ POP      Full           Yes      Yes                                 +---------+---------------+---------+-----------+----------+--------------+ PTV      Full                                                        +---------+---------------+---------+-----------+----------+--------------+ PERO     Full                                                         +---------+---------------+---------+-----------+----------+--------------+     Summary: RIGHT: - There is no evidence of deep vein thrombosis in the lower extremity.  - No cystic structure found in the popliteal fossa.  LEFT: - There is no evidence of deep vein thrombosis in the lower extremity.  - No cystic structure found in the popliteal fossa.  *See table(s) above for measurements and observations. Electronically signed by Lonni Gaskins MD on 06/11/2024 at 7:35:16 AM.    Final       LOS: 2 days    Elgin Lam, MD [facility] 06/12/2024, 12:44 PM   If 7PM-7AM, please contact night-coverage [email or web address]

## 2024-06-12 NOTE — TOC Initial Note (Signed)
 Transition of Care [facility]) - Initial/Assessment Note    Patient Details  Name: Frank Moss MRN: [id number] Date of Birth: [birth year]  [facility] CM/SW Contact:    Bridget Cordella Simmonds, LCSW Phone Number: 06/12/2024, 1:53 PM  Clinical Narrative:   Pt oriented x2, minimally able to participate in brief conversation. Legal guardian listed as Dendra Marcus/[place] [employer].  CSW confirmed with [facility]: pt is LTC resident there and can return. Will not be skilled.  CSW spoke with Wende Lame and confirmed pt will return to [facility] [place], likely tomorrow.                 Expected Discharge Plan: Long Term Nursing Home Barriers to Discharge: Continued Medical Work up   Patient Goals and CMS Choice     Choice offered to / list presented to : [facility] POA / Guardian (legal guardian Dendra Marcus/[place] [employer])      Expected Discharge Plan and Services In-house Referral: Clinical Social Work   Post Acute Care Choice: Skilled Nursing Facility Living arrangements for the past 2 months: Skilled Nursing Facility                                      Prior Living Arrangements/Services Living arrangements for the past 2 months: Skilled Nursing Facility Lives with:: Facility Resident Patient language and need for interpreter reviewed:: Yes        Need for Family Participation in Patient Care: Yes (Comment) Care giver support system in place?: Yes (comment) Current home services: Other (comment) (na) Criminal Activity/Legal Involvement Pertinent to Current Situation/Hospitalization: No - Comment as needed  Activities of Daily Living   ADL Screening (condition at time of admission) Independently performs ADLs?: No Does the patient have a NEW difficulty with bathing/dressing/toileting/self-feeding that is expected to last >3 days?: No Does the patient have a NEW difficulty with getting in/out of bed, walking, or climbing stairs that is expected to last >3 days?: No Does the  patient have a NEW difficulty with communication that is expected to last >3 days?: No Is the patient deaf or have difficulty hearing?: No Does the patient have difficulty seeing, even when wearing glasses/contacts?: No Does the patient have difficulty concentrating, remembering, or making decisions?: Yes  Permission Sought/Granted                  Emotional Assessment Appearance:: Appears older than stated age Attitude/Demeanor/Rapport: Unable to Assess Affect (typically observed): Unable to Assess Orientation: : Oriented to Self, Oriented to Place      Admission diagnosis:  Pneumonia [J18.9] Altered mental status, unspecified altered mental status type [R41.82] Community acquired pneumonia, unspecified laterality [J18.9] Patient Active Problem List   Diagnosis Date Noted   Pneumonia 06/10/2024   Episode of unresponsiveness 06/10/2024   Normocytic anemia 06/10/2024   Elevated troponin 06/10/2024   Hypokalemia 06/10/2024   Hypocalcemia 06/10/2024   Hypomagnesemia 06/10/2024   History of CVA with residual deficit 06/10/2024   GERD (gastroesophageal reflux disease) 06/10/2024   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) 05/03/2017   Labile blood pressure    Labile blood glucose    Chronic ischemic right anterior cerebral artery (ACA) stroke 02/24/2017   Controlled type 2 diabetes mellitus without complication, without long-term current use of insulin  (HCC)    Hypertensive crisis    Diabetes mellitus (HCC)    Essential hypertension    Urinary retention  Stage 3 chronic kidney disease (HCC)    Hyponatremia    Hypoalbuminemia due to protein-calorie malnutrition    Acute blood loss anemia    Acute thalamic infarction (HCC) 02/11/2017   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 (HCC) 02/10/2017   Cognitive deficit due to recent stroke 02/10/2017   Hemi-neglect of left side 02/10/2017   CKD (chronic kidney disease), stage  III (HCC) 02/09/2017   Dysphagia as late effect of stroke 02/08/2017   Acute ischemic right ACA stroke (HCC) 01/31/2017   Mixed hyperlipidemia 11/17/2007   Accelerated hypertension 11/17/2007   DM (diabetes mellitus), type 2 with peripheral vascular complications (HCC) 10/09/2007   ERECTILE DYSFUNCTION, MILD 10/09/2007   SHOULDER PAIN, LEFT 10/09/2007   History of colonic polyps 10/09/2007   PCP:  Cicero Aureliano SAUNDERS, MD Pharmacy:   [facility] - [place], [place] - [address] [place] [address] Phone: [phone] Fax: [phone]     Social Drivers of Health (SDOH) Social History: SDOH Screenings   Food Insecurity: Patient Unable To Answer (06/11/2024)  Housing: Patient Unable To Answer (06/12/2024)  Transportation Needs: Patient Unable To Answer (06/11/2024)  Utilities: Patient Unable To Answer (06/11/2024)  Tobacco Use: Low Risk  (06/09/2024)   SDOH Interventions:     Readmission Risk Interventions     No data to display

## 2024-06-13 DIAGNOSIS — J189 Pneumonia, unspecified organism: Secondary | ICD-10-CM | POA: Diagnosis not present

## 2024-06-13 LAB — BASIC METABOLIC PANEL WITH GFR
Anion gap: 12 (ref 5–15)
BUN: 20 mg/dL (ref 6–20)
CO2: 25 mmol/L (ref 22–32)
Calcium: 8.2 mg/dL — ABNORMAL LOW (ref 8.9–10.3)
Chloride: 100 mmol/L (ref 98–111)
Creatinine, Ser: 1.78 mg/dL — ABNORMAL HIGH (ref 0.61–1.24)
GFR, Estimated: 44 mL/min — ABNORMAL LOW (ref >60)
Glucose, Bld: 98 mg/dL (ref 70–99)
Potassium: 3.7 mmol/L (ref 3.5–5.1)
Sodium: 137 mmol/L (ref 135–145)

## 2024-06-13 MED ORDER — DOXYCYCLINE HYCLATE 100 MG PO TABS
100.0000 mg | ORAL_TABLET | Freq: Two times a day (BID) | ORAL | Status: DC
  Administered 2024-06-13: 100 mg via ORAL
  Filled 2024-06-13: qty 1

## 2024-06-13 MED ORDER — CEFDINIR 300 MG PO CAPS: 300.0000 mg | ORAL_CAPSULE | Freq: Two times a day (BID) | ORAL | Status: AC

## 2024-06-13 MED ORDER — LOSARTAN POTASSIUM 50 MG PO TABS: 50.0000 mg | ORAL_TABLET | Freq: Every day | ORAL | Status: AC

## 2024-06-13 MED ORDER — FOLIC ACID 1 MG PO TABS: 1.0000 mg | ORAL_TABLET | Freq: Every day | ORAL | Status: AC

## 2024-06-13 MED ORDER — DOXYCYCLINE HYCLATE 100 MG PO TABS: 100.0000 mg | ORAL_TABLET | Freq: Two times a day (BID) | ORAL | Status: AC

## 2024-06-13 NOTE — Discharge Summary (Signed)
 Physician Discharge Summary   Patient: Frank Moss MRN: [id number] DOB: 1970-08-09  Admit date:     06/09/2024  Discharge date: 06/13/24  Discharge Physician: Elgin Lam, MD   PCP: Cicero Aureliano SAUNDERS, MD   Recommendations at discharge:  PCP visit for hospital follow-up Will likely need continued titration of blood pressure medications  Discharge Diagnoses: Principal Problem:   Pneumonia Active Problems:   Episode of unresponsiveness   Normocytic anemia   Elevated troponin   Essential hypertension   Hypokalemia   Hypocalcemia   Hypomagnesemia   Controlled type 2 diabetes mellitus without complication, without long-term current use of insulin  (HCC)   History of CVA with residual deficit   Cognitive deficit due to recent stroke   GERD (gastroesophageal reflux disease)  Resolved Problems:   * No resolved hospital problems. *  Hospital Course: Frank Moss is a 53 y.o. male with a history of CVA with residual left-sided weakness, hemineglect, dysphagia, CAD, hyperlipidemia, hypertension, diabetes mellitus type 2, depression, vascular dementia.  Patient presented secondary to altered mental status of unclear etiology. Mental status improved to baseline. Associated electrolyte abnormalities which were resolved prior to discharge.  Assessment and Plan:  Possible community acquired pneumonia Chest x-ray on admission is significant for a left lower lobe consolidation, possibly indicating pneumonia. Associated small left pleural effusion noted as well. Concern for possible pneumonia etiology. Patient started empirically on Ceftriaxone  and azithromycin  for CAP coverage with transition to ceftriaxone  and doxycycline  secondary to QTc prolongation. Patient discharged on Cefdinir  and doxycycline  to complete course.   Unresponsiveness Unclear etiology. Patient significantly altered on arrival to the ED with a documented GCS of 3. Mental status has improved. Workup unremarkable for  etiology. Patient has a history of a right ACA stroke. EEG without obvious evidence of seizures.   Normocytic anemia Stable.   CKD stage 3b Baseline creatinine of 1.8 from 5 years ago. Creatinine of 1.69 on admission. Stable prior to discharge.   Likely demand ischemia Troponin trend not consistent with ACS. No chest pain.   Folate deficiency Unclear cause.   Primary hypertension Continue amlodipine , hydralazine  and losartan . Increased to losartan  50 mg daily. May need further increase in dose.  Prolonged QTc Borderline. Patient is on valbenazine  chronically. QTc of 479 msec.   Hypokalemia Resolved with repletion.   Hypocalcemia Improved with IV calcium .   Positive blood culture Blood culture significant for staphylococcus epidermidis. Likely contaminant.   Hypomagnesemia Patient given magnesium  sulfate. Resolved,   Diabetes mellitus type 2 Well controlled based on hemoglobin A1C of 4.0%.   History of CVA Noted. Continue Lipitor .   Hyperlipidemia Continue Lipitor .   Vertebral artery stenosis Noted on CT angio of the head and neck, measured as moderate and bilateral.   Depression Continue Zoloft .   Consultants: None Procedures performed: EEG  Disposition: Skilled nursing facility Diet recommendation: Cardiac and Carb modified diet   DISCHARGE MEDICATION: Allergies as of 06/13/2024       Reactions   Beta Adrenergic Blockers Other (See Comments)   Fatigue, but this isn't noted on the patient's MAR        Medication List     STOP taking these medications    multivitamin tablet   oxyCODONE -acetaminophen  5-325 MG tablet Commonly known as: PERCOCET/ROXICET   traMADol 50 MG tablet Commonly known as: ULTRAM       TAKE these medications    ACIDOPHILUS (PROBIOTIC) PO Take 1 capsule by mouth daily.   amLODipine  10 MG tablet Commonly known as: NORVASC  Take  1 tablet (10 mg total) by mouth daily.   atorvastatin  20 MG tablet Commonly known  as: LIPITOR  Take 20 mg by mouth at bedtime.   cefdinir  300 MG capsule Commonly known as: OMNICEF  Take 1 capsule (300 mg total) by mouth 2 (two) times daily for 2 days.   chlorhexidine  0.12 % solution Commonly known as: PERIDEX  Use as directed 15 mLs in the mouth or throat 2 (two) times daily.   cholecalciferol 25 MCG (1000 UNIT) tablet Commonly known as: VITAMIN D3 Take 1,000 Units by mouth daily.   divalproex  125 MG DR tablet Commonly known as: DEPAKOTE  Take 125 mg by mouth at bedtime.   doxycycline  100 MG tablet Commonly known as: VIBRA -TABS Take 1 tablet (100 mg total) by mouth every 12 (twelve) hours for 2 days.   folic acid  1 MG tablet Commonly known as: FOLVITE  Take 1 tablet (1 mg total) by mouth daily. Start taking on: June 14, 2024   hydrALAZINE  100 MG tablet Commonly known as: APRESOLINE  Take 100 mg by mouth 3 (three) times daily.   Ingrezza  60 MG capsule Generic drug: valbenazine  Take 60 mg by mouth every evening.   ketoconazole  2 % shampoo Commonly known as: NIZORAL  Apply 1 Application topically every Monday, Wednesday, and Friday. Apply topically to head, scalp, beard   lactulose  10 GM/15ML solution Commonly known as: CHRONULAC  Take 10 g by mouth every other day.   losartan  50 MG tablet Commonly known as: COZAAR  Take 1 tablet (50 mg total) by mouth daily. Start taking on: June 14, 2024 What changed:  medication strength how much to take   Multivitamin Plus Iron Adult Tabs Take 1 tablet by mouth daily.   nitroGLYCERIN 0.4 MG SL tablet Commonly known as: NITROSTAT Place 0.4 mg under the tongue every 5 (five) minutes as needed for chest pain.   ondansetron 4 MG tablet Commonly known as: ZOFRAN Take 4 mg by mouth every 6 (six) hours as needed for nausea.   pantoprazole  40 MG tablet Commonly known as: PROTONIX  Take 40 mg by mouth daily.   polyethylene glycol 17 g packet Commonly known as: MIRALAX  / GLYCOLAX  Take 17 g by mouth  daily.   Procrit 10000 UNIT/ML injection Generic drug: epoetin alfa Inject 10,000 Units into the skin every Wednesday.   senna-docusate 8.6-50 MG tablet Commonly known as: Senokot-S Take 2 tablets by mouth 2 (two) times daily.   sertraline  25 MG tablet Commonly known as: ZOLOFT  Take 75 mg by mouth daily.   tamsulosin  0.4 MG Caps capsule Commonly known as: FLOMAX  Take 0.4 mg by mouth at bedtime.        Discharge Exam: BP (!) 161/99 (BP Location: Left Arm)   Pulse 100   Temp 97.9 F (36.6 C)   Resp 17   SpO2 100%   General exam: Appears calm and comfortable. Respiratory system: Clear to auscultation. Respiratory effort normal. Cardiovascular system: S1 & S2 Ahlquist, RRR. Gastrointestinal system: Abdomen is nondistended, soft and nontender. Normal bowel sounds Pfeifle.  Condition at discharge: stable  The results of significant diagnostics from this hospitalization (including imaging, microbiology, ancillary and laboratory) are listed below for reference.   Imaging Studies: DG CHEST PORT 1 VIEW Result Date: 06/11/2024 CLINICAL DATA:  Shortness of breath. EXAM: PORTABLE CHEST 1 VIEW COMPARISON:  Radiographs 06/09/2024 and 02/08/2017. FINDINGS: 0955 hours. The heart size and mediastinal contours are stable. Persistent left basilar airspace disease and probable left pleural effusion. The right lung appears clear. No evidence of pneumothorax. The bones  appear unremarkable. IMPRESSION: Persistent left basilar airspace disease which could reflect pneumonia and probable left pleural effusion. Electronically Signed   By: Elsie Perone M.D.   On: 06/11/2024 13:21   NM Pulmonary Perfusion Result Date: 06/11/2024 CLINICAL DATA:  Elevated D-dimer levels. Altered mental status. Left basilar airspace disease on radiographs. EXAM: NUCLEAR MEDICINE PERFUSION LUNG SCAN TECHNIQUE: Perfusion images were obtained in multiple projections after intravenous injection of radiopharmaceutical. No  ventilation imaging performed. RADIOPHARMACEUTICALS:  4.28 mCi Tc-26m MAA IV COMPARISON:  Radiographs 06/11/2024, 06/09/2024 and 02/08/2017. FINDINGS: Decreased perfusion medially in the left lower lobe, corresponding with airspace disease on radiographs. No wedge-shaped perfusion defects are demonstrated elsewhere to suggest pulmonary embolism. IMPRESSION: 1. Nonspecific decreased perfusion to the left lower lobe with corresponding airspace disease on radiographs. 2. No wedge-shaped perfusion defects are demonstrated elsewhere to suggest pulmonary embolism. Electronically Signed   By: Elsie Perone M.D.   On: 06/11/2024 13:19   EEG adult Result Date: 06/11/2024 Gregg Lek, MD     06/11/2024 12:50 PM Patient Name: Terez Freimark MRN: [id number] Epilepsy Attending: Lek Gregg Referring Physician/Provider: No ref. provider found     Date: 06/11/2024 Duration: 22 minutes Patient history: 53 year old man admitted for pneumonia and altered mental status. EEG to evaluate for seizure Level of alertness: Awake, drowsy AEDs during EEG study: None Technical aspects: This EEG study was done with scalp electrodes positioned according to the 10-20 International system of electrode placement. Electrical activity was reviewed with band pass filter of 1-70Hz , sensitivity of 7 uV/mm, display speed of 25mm/sec with a 60Hz  notched filter applied as appropriate. EEG data were recorded continuously and digitally stored.  Video monitoring was available and reviewed as appropriate. Description: Technically very difficult EEG to read as patient has constant and severe bruxism which interfere with the EEG background due to muscle artifact. A PDR was not clearly seen. Hyperventilation and photic stimulation were not performed.   IMPRESSION: Technically difficulty EEG due to increase muscle artifact from patient severe bruxism. With the limitations, the background is continuous, symmetric and no seizures or epileptiform discharges  were seen throughout the recording. Lek Gregg MD Neurology    VAS US  LOWER EXTREMITY VENOUS (DVT) Result Date: 06/11/2024  Lower Venous DVT Study Patient Name:  Mallie Nies  Date of Exam:   06/10/2024 Medical Rec #: [id number]      Accession #:    [id number] Date of Birth: 1971-03-18      Patient Gender: M Patient Age:   70 years Exam Location[facility]  [facility] Procedure:      VAS US  LOWER EXTREMITY VENOUS (DVT) Referring Phys: MAXIMINO SHARPS --------------------------------------------------------------------------------  Indications: Elevated Ddimer.  Risk Factors: None identified. Limitations: Poor ultrasound/tissue interface and patient positioning, patient immobility. Comparison Study: No prior studies. Performing Technologist: Cordella Collet RVT  Examination Guidelines: A complete evaluation includes B-mode imaging, spectral Doppler, color Doppler, and power Doppler as needed of all accessible portions of each vessel. Bilateral testing is considered an integral part of a complete examination. Limited examinations for reoccurring indications may be performed as noted. The reflux portion of the exam is performed with the patient in reverse Trendelenburg.  +---------+---------------+---------+-----------+----------+--------------+ RIGHT    CompressibilityPhasicitySpontaneityPropertiesThrombus Aging +---------+---------------+---------+-----------+----------+--------------+ CFV      Full           Yes      Yes                                 +---------+---------------+---------+-----------+----------+--------------+  SFJ      Full                                                        +---------+---------------+---------+-----------+----------+--------------+ FV Prox  Full                                                        +---------+---------------+---------+-----------+----------+--------------+ FV Mid   Full                                                         +---------+---------------+---------+-----------+----------+--------------+ FV DistalFull                                                        +---------+---------------+---------+-----------+----------+--------------+ PFV      Full                                                        +---------+---------------+---------+-----------+----------+--------------+ POP      Full           Yes      Yes                                 +---------+---------------+---------+-----------+----------+--------------+ PTV      Full                                                        +---------+---------------+---------+-----------+----------+--------------+ PERO     Full                                                        +---------+---------------+---------+-----------+----------+--------------+   +---------+---------------+---------+-----------+----------+--------------+ LEFT     CompressibilityPhasicitySpontaneityPropertiesThrombus Aging +---------+---------------+---------+-----------+----------+--------------+ CFV      Full           Yes      Yes                                 +---------+---------------+---------+-----------+----------+--------------+ SFJ      Full                                                        +---------+---------------+---------+-----------+----------+--------------+  FV Prox  Full                                                        +---------+---------------+---------+-----------+----------+--------------+ FV Mid   Full                                                        +---------+---------------+---------+-----------+----------+--------------+ FV DistalFull                                                        +---------+---------------+---------+-----------+----------+--------------+ PFV      Full                                                         +---------+---------------+---------+-----------+----------+--------------+ POP      Full           Yes      Yes                                 +---------+---------------+---------+-----------+----------+--------------+ PTV      Full                                                        +---------+---------------+---------+-----------+----------+--------------+ PERO     Full                                                        +---------+---------------+---------+-----------+----------+--------------+     Summary: RIGHT: - There is no evidence of deep vein thrombosis in the lower extremity.  - No cystic structure found in the popliteal fossa.  LEFT: - There is no evidence of deep vein thrombosis in the lower extremity.  - No cystic structure found in the popliteal fossa.  *See table(s) above for measurements and observations. Electronically signed by Lonni Gaskins MD on 06/11/2024 at 7:35:16 AM.    Final    CT ANGIO HEAD NECK W WO CM Result Date: 06/10/2024 EXAM: CT HEAD WITHOUT CTA HEAD AND NECK WITH AND WITHOUT 06/10/2024 12:01:42 AM TECHNIQUE: CTA of the head and neck was performed with and without the administration of 75 mL of iohexol  (OMNIPAQUE ) 350 MG/ML injection. Noncontrast CT of the head with reconstructed 2-D images are also provided for review. Multiplanar 2D and/or 3D reformatted images are provided for review. Automated exposure control, iterative reconstruction, and/or weight based adjustment of the mA/kV was utilized to reduce the radiation dose to as low as reasonably  achievable. COMPARISON: 02/08/2017 CLINICAL HISTORY: Neuro deficit, acute, stroke suspected. FINDINGS: CT HEAD: BRAIN AND VENTRICLES: No acute intracranial hemorrhage. No mass effect. No midline shift. No extra-axial fluid collection. No evidence of acute infarct. No hydrocephalus. Chronic ischemic white matter changes. Old right PCA territory infarct. ORBITS: No acute abnormality. SINUSES AND  MASTOIDS: Chronic bilateral maxillary and right frontal sinus disease. CTA NECK: AORTIC ARCH AND ARCH VESSELS: No dissection or arterial injury. No significant stenosis of the brachiocephalic or subclavian arteries. CERVICAL CAROTID ARTERIES: Minimal atherosclerotic calcification of the common carotid arteries, no hemodynamically significant stenosis. No dissection or arterial injury. CERVICAL VERTEBRAL ARTERIES: Atherosclerosis of the V4 segments of both vertebral arteries with moderate stenosis bilaterally. No dissection or arterial injury. LUNGS AND MEDIASTINUM: Small left pleural effusion. SOFT TISSUES: No acute abnormality. BONES: No acute abnormality. CTA HEAD: ANTERIOR CIRCULATION: Calcification of the carotid siphons without hemodynamically significant stenosis. No significant stenosis of the internal carotid arteries. No significant stenosis of the anterior cerebral arteries. No significant stenosis of the middle cerebral arteries. No aneurysm. POSTERIOR CIRCULATION: No significant stenosis of the posterior cerebral arteries. No significant stenosis of the basilar artery. No significant stenosis of the vertebral arteries. No aneurysm. OTHER: No dural venous sinus thrombosis on this non-dedicated study. IMPRESSION: 1. No large vessel occlusion in the head or neck. 2. Chronic ischemic white matter changes and chronic right PCA territory infarct. 3. Moderate bilateral V4 vertebral artery atherosclerotic stenosis. Electronically signed by: Franky Stanford MD 06/10/2024 12:15 AM EST RP Workstation: [id number]   DG Chest Portable 1 View Result Date: 06/09/2024 CLINICAL DATA:  Short of breath EXAM: PORTABLE CHEST 1 VIEW COMPARISON:  02/08/2017 FINDINGS: Single frontal view of the chest demonstrates an unremarkable cardiac silhouette. There is increased density at the left lung base obscuring the left hemidiaphragm, consistent with left lower lobe consolidation and likely left pleural effusion. Right chest is  clear. No pneumothorax. No acute bony abnormalities. IMPRESSION: 1. Left lower lobe consolidation, which may reflect atelectasis or airspace disease. 2. Small left pleural effusion. Electronically Signed   By: Ozell Daring M.D.   On: 06/09/2024 19:17   VAS US  ABI WITH/WO TBI Result Date: 05/31/2024  LOWER EXTREMITY DOPPLER STUDY Patient Name:  Maxamillion Hinote  Date of Exam:   05/31/2024 Medical Rec #: [id number]      Accession #:    [id number] Date of Birth: 12/15/1970      Patient Gender: M Patient Age:   26 years Exam Location:  [address] Procedure:      VAS US  ABI WITH/WO TBI Referring Phys: JOSHUA ROBINS --------------------------------------------------------------------------------  Indications: Left foot ulcer  Limitations: Today's exam was limited due to Cognitive disability and              uncooperative. Limited exam. Performing Technologist: Geni Lodge RVS, RCS  Examination Guidelines: A complete evaluation includes at minimum, Doppler waveform signals and systolic blood pressure reading at the level of bilateral brachial, anterior tibial, and posterior tibial arteries, when vessel segments are accessible. Bilateral testing is considered an integral part of a complete examination. Photoelectric Plethysmograph (PPG) waveforms and toe systolic pressure readings are included as required and additional duplex testing as needed. Limited examinations for reoccurring indications may be performed as noted.  ABI Findings: +-----+------------------+-----+--------+--------------------+ RightRt Pressure (mmHg)IndexWaveformComment              +-----+------------------+-----+--------+--------------------+ PTA  patient refused cuff +-----+------------------+-----+--------+--------------------+ DP                          biphasicpatient refused cuff +-----+------------------+-----+--------+--------------------+  +--------+------------------+-----+--------+--------------------+ Left    Lt Pressure (mmHg)IndexWaveformComment              +--------+------------------+-----+--------+--------------------+ [id number]                                                 +--------+------------------+-----+--------+--------------------+ PTA                            biphasicpatient refused cuff +--------+------------------+-----+--------+--------------------+ DP                             biphasicpatient refused cuff +--------+------------------+-----+--------+--------------------+   Summary: Left:  Waveforms appear biphasic. Technically difficult exam due to patient cooperation. Unable to obtain indices and toe waveform due to patient movement and agitation.  *See table(s) above for measurements and observations.  Electronically signed by Fonda Rim on 05/31/2024 at 11:36:40 AM.    Final     Microbiology: Results for orders placed or performed during the hospital encounter of 06/09/24  Blood culture (routine x 2)     Status: Abnormal   Collection Time: 06/09/24 12:07 AM   Specimen: BLOOD LEFT ARM  Result Value Ref Range Status   Specimen Description BLOOD LEFT ARM  Final   Special Requests   Final    BOTTLES DRAWN AEROBIC AND ANAEROBIC Blood Culture adequate volume   Culture  Setup Time   Final    GRAM POSITIVE COCCI IN BOTH AEROBIC AND ANAEROBIC BOTTLES CRITICAL RESULT CALLED TO, READ BACK BY AND VERIFIED WITH: PHARMD ERIN W. [id number] AT 1920, ADC    Culture (A)  Final    STAPHYLOCOCCUS EPIDERMIDIS THE SIGNIFICANCE OF ISOLATING THIS ORGANISM FROM A SINGLE SET OF BLOOD CULTURES WHEN MULTIPLE SETS ARE DRAWN IS UNCERTAIN. PLEASE NOTIFY THE MICROBIOLOGY DEPARTMENT WITHIN ONE WEEK IF SPECIATION AND SENSITIVITIES ARE REQUIRED. Performed at [facility], [address], [place], [place] [address]    Report Status 06/12/2024 FINAL  Final  Blood Culture ID Panel (Reflexed)     Status:  Abnormal   Collection Time: 06/09/24 12:07 AM  Result Value Ref Range Status   Enterococcus faecalis NOT DETECTED NOT DETECTED Final   Enterococcus Faecium NOT DETECTED NOT DETECTED Final   Listeria monocytogenes NOT DETECTED NOT DETECTED Final   Staphylococcus species DETECTED (A) NOT DETECTED Final    Comment: CRITICAL RESULT CALLED TO, READ BACK BY AND VERIFIED WITH: PHARMD ERIN W. [id number] AT 1920, ADC    Staphylococcus aureus (BCID) NOT DETECTED NOT DETECTED Final   Staphylococcus epidermidis DETECTED (A) NOT DETECTED Final    Comment: Methicillin (oxacillin) resistant coagulase negative staphylococcus. Possible blood culture contaminant (unless isolated from more than one blood culture draw or clinical case suggests pathogenicity). No antibiotic treatment is indicated for blood  culture contaminants. CRITICAL RESULT CALLED TO, READ BACK BY AND VERIFIED WITH: PHARMD ERIN W. [id number] AT 1920, ADC    Staphylococcus lugdunensis NOT DETECTED NOT DETECTED Final   Streptococcus species NOT DETECTED NOT DETECTED Final   Streptococcus agalactiae NOT DETECTED NOT DETECTED Final   Streptococcus pneumoniae NOT DETECTED NOT DETECTED Final  Streptococcus pyogenes NOT DETECTED NOT DETECTED Final   A.calcoaceticus-baumannii NOT DETECTED NOT DETECTED Final   Bacteroides fragilis NOT DETECTED NOT DETECTED Final   Enterobacterales NOT DETECTED NOT DETECTED Final   Enterobacter cloacae complex NOT DETECTED NOT DETECTED Final   Escherichia coli NOT DETECTED NOT DETECTED Final   Klebsiella aerogenes NOT DETECTED NOT DETECTED Final   Klebsiella oxytoca NOT DETECTED NOT DETECTED Final   Klebsiella pneumoniae NOT DETECTED NOT DETECTED Final   Proteus species NOT DETECTED NOT DETECTED Final   Salmonella species NOT DETECTED NOT DETECTED Final   Serratia marcescens NOT DETECTED NOT DETECTED Final   Haemophilus influenzae NOT DETECTED NOT DETECTED Final   Neisseria meningitidis NOT DETECTED NOT DETECTED  Final   Pseudomonas aeruginosa NOT DETECTED NOT DETECTED Final   Stenotrophomonas maltophilia NOT DETECTED NOT DETECTED Final   Candida albicans NOT DETECTED NOT DETECTED Final   Candida auris NOT DETECTED NOT DETECTED Final   Candida glabrata NOT DETECTED NOT DETECTED Final   Candida krusei NOT DETECTED NOT DETECTED Final   Candida parapsilosis NOT DETECTED NOT DETECTED Final   Candida tropicalis NOT DETECTED NOT DETECTED Final   Cryptococcus neoformans/gattii NOT DETECTED NOT DETECTED Final   Methicillin resistance mecA/C DETECTED (A) NOT DETECTED Final    Comment: CRITICAL RESULT CALLED TO, READ BACK BY AND VERIFIED WITH: PHARMD ROCKY MOHR [id number] AT 1920, ADC Performed at [facility], [address], [place], [place] [address]   Resp panel by RT-PCR (RSV, Flu A&B, Covid) Anterior Nasal Swab     Status: None   Collection Time: 06/09/24  7:30 PM   Specimen: Anterior Nasal Swab  Result Value Ref Range Status   SARS Coronavirus 2 by RT PCR NEGATIVE NEGATIVE Final   Influenza A by PCR NEGATIVE NEGATIVE Final   Influenza B by PCR NEGATIVE NEGATIVE Final    Comment: (NOTE) The Xpert Xpress SARS-CoV-2/FLU/RSV plus assay is intended as an aid in the diagnosis of influenza from Nasopharyngeal swab specimens and should not be used as a sole basis for treatment. Nasal washings and aspirates are unacceptable for Xpert Xpress SARS-CoV-2/FLU/RSV testing.  Fact Sheet for Patients: bloggercourse.com  Fact Sheet for Healthcare Providers: seriousbroker.it  This test is not yet approved or cleared by the [place]  FDA and has been authorized for detection and/or diagnosis of SARS-CoV-2 by FDA under an Emergency Use Authorization (EUA). This EUA will remain in effect (meaning this test can be used) for the duration of the COVID-19 declaration under Section 564(b)(1) of the Act, 21 U.S.C. section 360bbb-3(b)(1), unless the authorization is  terminated or revoked.     Resp Syncytial Virus by PCR NEGATIVE NEGATIVE Final    Comment: (NOTE) Fact Sheet for Patients: bloggercourse.com  Fact Sheet for Healthcare Providers: seriousbroker.it  This test is not yet approved or cleared by the [place]  FDA and has been authorized for detection and/or diagnosis of SARS-CoV-2 by FDA under an Emergency Use Authorization (EUA). This EUA will remain in effect (meaning this test can be used) for the duration of the COVID-19 declaration under Section 564(b)(1) of the Act, 21 U.S.C. section 360bbb-3(b)(1), unless the authorization is terminated or revoked.  Performed at [facility], [address], [place], [place] [address]   Blood culture (routine x 2)     Status: None (Preliminary result)   Collection Time: 06/09/24 11:16 PM   Specimen: BLOOD LEFT FOREARM  Result Value Ref Range Status   Specimen Description BLOOD LEFT FOREARM  Final  Special Requests   Final    BOTTLES DRAWN AEROBIC AND ANAEROBIC Blood Culture adequate volume   Culture   Final    NO GROWTH 4 DAYS Performed at [facility], [address], [place], [place] [address]    Report Status PENDING  Incomplete    Labs: CBC: Recent Labs  Lab 06/09/24 1930 06/09/24 1939 06/10/24 0908 06/11/24 0613  WBC 6.1  --  6.9 7.7  NEUTROABS 3.6  --   --   --   HGB 8.1* 8.8*  8.2* 8.5* 8.8*  HCT 26.2* 26.0*  24.0* 27.3* 26.9*  MCV 89.7  --  90.1 87.3  PLT 382  --  393 404*   Basic Metabolic Panel: Recent Labs  Lab 06/09/24 1930 06/09/24 1939 06/11/24 0613 06/12/24 0445 06/13/24 0455  NA 142 140  142 140 137 137  K 3.0* 3.0*  3.0* 3.5 3.3* 3.7  CL 100 97* 100 99 100  CO2 29  --  31 26 25   GLUCOSE 83 86 130* 130* 98  BUN 16 17 20  21* 20  CREATININE 1.69* 1.80* 1.85* 1.96* 1.78*  CALCIUM  7.4*  --  7.9* 7.9* 8.2*  MG 1.2*  --  1.4* 1.9  --    Liver Function Tests: Recent Labs  Lab  06/09/24 1930  AST 15  ALT 11  ALKPHOS 69  BILITOT 0.4  PROT 6.0*  ALBUMIN 2.3*   CBG: Recent Labs  Lab 06/10/24 1207 06/10/24 1638 06/10/24 2104 06/11/24 0627 06/11/24 1233  GLUCAP 87 132* 101* 125* 125*    Discharge time spent: 35 minutes.  Signed: Elgin Lam, MD [facility] 06/13/2024

## 2024-06-13 NOTE — Care Management Important Message (Signed)
 Important Message  Patient Details  Name: Frank Moss MRN: [id number] Date of Birth: [birth year]   Important Message Given:  Yes - Medicare IM     Jennie Laneta Dragon 06/13/2024, 12:27 PM

## 2024-06-13 NOTE — TOC Transition Note (Signed)
 Transition of Care [facility]) - Discharge Note   Patient Details  Name: Frank Moss MRN: [id number] Date of Birth: 13-Dec-1970  [facility] of [facility]) CM/SW Contact:  Bridget Cordella Simmonds, LCSW Phone Number: 06/13/2024, 12:51 PM   Clinical Narrative:   Pt discharging to [facility]. RN report to [phone].   CSW left message with DSS legal guardian/social worker Wende Lame regarding DC.  1000: CSW confirmed with [facility] that they can receive pt today.   Final next level of care: Long Term Nursing Home Barriers to Discharge: Barriers Resolved   Patient Goals and CMS Choice     Choice offered to / list presented to : [facility] POA / Guardian (legal guardian Dendra Marcus/[place] [employer])      Discharge Placement              Patient chooses bed at: [facility] Patient to be transferred to facility by: [facility] Name of family member notified: wende lame, legal guardian Patient and family notified of of transfer: 06/13/24  Discharge Plan and Services Additional resources added to the After Visit Summary for   In-house Referral: Clinical Social Work   Post Acute Care Choice: Skilled Nursing Facility                               Social Drivers of Health (SDOH) Interventions SDOH Screenings   Food Insecurity: Patient Unable To Answer (06/11/2024)  Housing: Patient Unable To Answer (06/12/2024)  Transportation Needs: Patient Unable To Answer (06/11/2024)  Utilities: Patient Unable To Answer (06/11/2024)  Tobacco Use: Low Risk  (06/09/2024)     Readmission Risk Interventions     No data to display

## 2024-06-13 NOTE — Discharge Instructions (Signed)
 Frank Moss,  You were in the hospital after an episode of unresponsiveness. It is not clear why this happened, but it could be related to medications. Your pain medications have been discontinued, and you have not required them while you have been here. You also had some renal impairment, which has improved with IV fluids. Please follow-up with your primary care physician.

## 2024-06-13 NOTE — Discharge Planning (Signed)
 Patient alert. IV access removed. Discharge teaching given to [facility] RN at [facility]. Discharge summary placed in discharge packet and placed with patient belongings. Patient will be transported via [facility]

## 2024-06-14 LAB — CULTURE, BLOOD (ROUTINE X 2)
Culture: NO GROWTH
Special Requests: ADEQUATE
